# Patient Record
Sex: Female | Born: 1945 | Race: White | Hispanic: No | Marital: Married | Smoking: Never smoker
Health system: Southern US, Community
[De-identification: ages and names within clinical notes are randomized; demographics above are authoritative.]

## PROBLEM LIST (undated history)

## (undated) ENCOUNTER — Emergency Department (HOSPITAL_COMMUNITY): Disposition: A | Discharge status: Other Acute Inpatient Hospital | Payer: Medicare Other

## (undated) DIAGNOSIS — F329 Major depressive disorder, single episode, unspecified: Secondary | ICD-10-CM

## (undated) DIAGNOSIS — L97909 Non-pressure chronic ulcer of unspecified part of unspecified lower leg with unspecified severity: Secondary | ICD-10-CM

## (undated) DIAGNOSIS — C801 Malignant (primary) neoplasm, unspecified: Secondary | ICD-10-CM

## (undated) DIAGNOSIS — D649 Anemia, unspecified: Secondary | ICD-10-CM

## (undated) DIAGNOSIS — R569 Unspecified convulsions: Secondary | ICD-10-CM

## (undated) DIAGNOSIS — K5900 Constipation, unspecified: Secondary | ICD-10-CM

## (undated) DIAGNOSIS — I83009 Varicose veins of unspecified lower extremity with ulcer of unspecified site: Secondary | ICD-10-CM

## (undated) DIAGNOSIS — E1121 Type 2 diabetes mellitus with diabetic nephropathy: Secondary | ICD-10-CM

## (undated) DIAGNOSIS — I872 Venous insufficiency (chronic) (peripheral): Secondary | ICD-10-CM

## (undated) DIAGNOSIS — I4891 Unspecified atrial fibrillation: Secondary | ICD-10-CM

## (undated) DIAGNOSIS — G629 Polyneuropathy, unspecified: Secondary | ICD-10-CM

## (undated) DIAGNOSIS — M1990 Unspecified osteoarthritis, unspecified site: Secondary | ICD-10-CM

## (undated) DIAGNOSIS — H409 Unspecified glaucoma: Secondary | ICD-10-CM

## (undated) DIAGNOSIS — E0821 Diabetes mellitus due to underlying condition with diabetic nephropathy: Secondary | ICD-10-CM

## (undated) DIAGNOSIS — F32A Depression, unspecified: Secondary | ICD-10-CM

## (undated) DIAGNOSIS — C719 Malignant neoplasm of brain, unspecified: Secondary | ICD-10-CM

## (undated) HISTORY — DX: Venous insufficiency (chronic) (peripheral): I87.2

## (undated) HISTORY — DX: Diabetes mellitus due to underlying condition with diabetic nephropathy: E08.21

## (undated) HISTORY — DX: Varicose veins of unspecified lower extremity with ulcer of unspecified site: I83.009

## (undated) HISTORY — DX: Malignant neoplasm of brain, unspecified: C71.9

## (undated) HISTORY — PX: CERVICAL ABLATION: SHX5771

## (undated) HISTORY — DX: Type 2 diabetes mellitus with diabetic nephropathy: E11.21

## (undated) HISTORY — DX: Unspecified osteoarthritis, unspecified site: M19.90

## (undated) HISTORY — PX: TONSILECTOMY, ADENOIDECTOMY, BILATERAL MYRINGOTOMY AND TUBES: SHX2538

## (undated) HISTORY — PX: MOUTH SURGERY: SHX715

## (undated) HISTORY — DX: Anemia, unspecified: D64.9

## (undated) HISTORY — DX: Polyneuropathy, unspecified: G62.9

## (undated) HISTORY — DX: Non-pressure chronic ulcer of unspecified part of unspecified lower leg with unspecified severity: L97.909

---

## 2000-01-18 ENCOUNTER — Encounter: Admission: RE | Admit: 2000-01-18 | Discharge: 2000-04-09 | Payer: Self-pay | Admitting: Internal Medicine

## 2000-04-11 ENCOUNTER — Encounter: Admission: RE | Admit: 2000-04-11 | Discharge: 2000-06-12 | Payer: Self-pay | Admitting: Orthopedic Surgery

## 2000-06-13 ENCOUNTER — Encounter: Admission: RE | Admit: 2000-06-13 | Discharge: 2000-09-11 | Payer: Self-pay | Admitting: Internal Medicine

## 2000-08-04 ENCOUNTER — Emergency Department (HOSPITAL_COMMUNITY): Admission: EM | Admit: 2000-08-04 | Discharge: 2000-08-04 | Payer: Self-pay | Admitting: Emergency Medicine

## 2000-08-24 ENCOUNTER — Ambulatory Visit (HOSPITAL_COMMUNITY): Admission: RE | Admit: 2000-08-24 | Discharge: 2000-08-24 | Payer: Self-pay | Admitting: Family Medicine

## 2000-09-12 ENCOUNTER — Encounter: Admission: RE | Admit: 2000-09-12 | Discharge: 2000-12-11 | Payer: Self-pay | Admitting: Internal Medicine

## 2000-12-12 ENCOUNTER — Encounter: Admission: RE | Admit: 2000-12-12 | Discharge: 2001-03-12 | Payer: Self-pay | Admitting: Internal Medicine

## 2001-03-13 ENCOUNTER — Encounter: Admission: RE | Admit: 2001-03-13 | Discharge: 2001-05-20 | Payer: Self-pay | Admitting: Internal Medicine

## 2001-08-21 ENCOUNTER — Encounter: Admission: RE | Admit: 2001-08-21 | Discharge: 2001-09-09 | Payer: Self-pay | Admitting: Internal Medicine

## 2002-02-05 ENCOUNTER — Encounter (HOSPITAL_BASED_OUTPATIENT_CLINIC_OR_DEPARTMENT_OTHER): Admission: RE | Admit: 2002-02-05 | Discharge: 2002-02-07 | Payer: Self-pay | Admitting: Internal Medicine

## 2002-05-12 ENCOUNTER — Encounter (HOSPITAL_BASED_OUTPATIENT_CLINIC_OR_DEPARTMENT_OTHER): Admission: RE | Admit: 2002-05-12 | Discharge: 2002-05-15 | Payer: Self-pay | Admitting: Internal Medicine

## 2002-08-18 ENCOUNTER — Encounter (HOSPITAL_BASED_OUTPATIENT_CLINIC_OR_DEPARTMENT_OTHER): Admission: RE | Admit: 2002-08-18 | Discharge: 2002-11-16 | Payer: Self-pay | Admitting: Internal Medicine

## 2002-11-17 ENCOUNTER — Encounter (HOSPITAL_BASED_OUTPATIENT_CLINIC_OR_DEPARTMENT_OTHER): Admission: RE | Admit: 2002-11-17 | Discharge: 2002-12-10 | Payer: Self-pay | Admitting: Internal Medicine

## 2002-12-11 ENCOUNTER — Encounter (HOSPITAL_BASED_OUTPATIENT_CLINIC_OR_DEPARTMENT_OTHER): Admission: RE | Admit: 2002-12-11 | Discharge: 2003-03-11 | Payer: Self-pay | Admitting: Internal Medicine

## 2003-03-12 ENCOUNTER — Encounter (HOSPITAL_BASED_OUTPATIENT_CLINIC_OR_DEPARTMENT_OTHER): Admission: RE | Admit: 2003-03-12 | Discharge: 2003-06-10 | Payer: Self-pay | Admitting: Internal Medicine

## 2003-06-15 ENCOUNTER — Encounter (HOSPITAL_BASED_OUTPATIENT_CLINIC_OR_DEPARTMENT_OTHER): Admission: RE | Admit: 2003-06-15 | Discharge: 2003-07-07 | Payer: Self-pay | Admitting: Internal Medicine

## 2003-07-15 ENCOUNTER — Encounter (HOSPITAL_BASED_OUTPATIENT_CLINIC_OR_DEPARTMENT_OTHER): Admission: RE | Admit: 2003-07-15 | Discharge: 2003-08-12 | Payer: Self-pay | Admitting: Internal Medicine

## 2003-08-25 ENCOUNTER — Encounter (HOSPITAL_BASED_OUTPATIENT_CLINIC_OR_DEPARTMENT_OTHER): Admission: RE | Admit: 2003-08-25 | Discharge: 2003-11-16 | Payer: Self-pay | Admitting: Internal Medicine

## 2003-11-17 ENCOUNTER — Encounter (HOSPITAL_BASED_OUTPATIENT_CLINIC_OR_DEPARTMENT_OTHER): Admission: RE | Admit: 2003-11-17 | Discharge: 2004-02-11 | Payer: Self-pay | Admitting: Internal Medicine

## 2004-02-12 ENCOUNTER — Encounter (HOSPITAL_BASED_OUTPATIENT_CLINIC_OR_DEPARTMENT_OTHER): Admission: RE | Admit: 2004-02-12 | Discharge: 2004-05-12 | Payer: Self-pay | Admitting: Internal Medicine

## 2004-06-01 ENCOUNTER — Encounter (HOSPITAL_BASED_OUTPATIENT_CLINIC_OR_DEPARTMENT_OTHER): Admission: RE | Admit: 2004-06-01 | Discharge: 2004-08-24 | Payer: Self-pay | Admitting: Internal Medicine

## 2004-08-25 ENCOUNTER — Encounter (HOSPITAL_BASED_OUTPATIENT_CLINIC_OR_DEPARTMENT_OTHER): Admission: RE | Admit: 2004-08-25 | Discharge: 2004-11-25 | Payer: Self-pay | Admitting: Internal Medicine

## 2004-11-23 ENCOUNTER — Encounter (HOSPITAL_BASED_OUTPATIENT_CLINIC_OR_DEPARTMENT_OTHER): Admission: RE | Admit: 2004-11-23 | Discharge: 2004-12-02 | Payer: Self-pay | Admitting: Internal Medicine

## 2004-12-15 ENCOUNTER — Encounter (HOSPITAL_BASED_OUTPATIENT_CLINIC_OR_DEPARTMENT_OTHER): Admission: RE | Admit: 2004-12-15 | Discharge: 2005-03-15 | Payer: Self-pay | Admitting: Surgery

## 2005-03-17 ENCOUNTER — Encounter (HOSPITAL_BASED_OUTPATIENT_CLINIC_OR_DEPARTMENT_OTHER): Admission: RE | Admit: 2005-03-17 | Discharge: 2005-05-09 | Payer: Self-pay | Admitting: Surgery

## 2005-05-30 ENCOUNTER — Encounter (HOSPITAL_BASED_OUTPATIENT_CLINIC_OR_DEPARTMENT_OTHER): Admission: RE | Admit: 2005-05-30 | Discharge: 2005-06-07 | Payer: Self-pay | Admitting: Surgery

## 2005-09-28 ENCOUNTER — Encounter (HOSPITAL_BASED_OUTPATIENT_CLINIC_OR_DEPARTMENT_OTHER): Admission: RE | Admit: 2005-09-28 | Discharge: 2005-12-27 | Payer: Self-pay | Admitting: Surgery

## 2006-01-05 ENCOUNTER — Encounter (HOSPITAL_BASED_OUTPATIENT_CLINIC_OR_DEPARTMENT_OTHER): Admission: RE | Admit: 2006-01-05 | Discharge: 2006-04-05 | Payer: Self-pay | Admitting: Internal Medicine

## 2006-03-26 ENCOUNTER — Emergency Department (HOSPITAL_COMMUNITY): Admission: EM | Admit: 2006-03-26 | Discharge: 2006-03-27 | Payer: Self-pay | Admitting: Emergency Medicine

## 2006-04-11 ENCOUNTER — Encounter (HOSPITAL_BASED_OUTPATIENT_CLINIC_OR_DEPARTMENT_OTHER): Admission: RE | Admit: 2006-04-11 | Discharge: 2006-05-16 | Payer: Self-pay | Admitting: Surgery

## 2006-05-28 ENCOUNTER — Encounter (HOSPITAL_BASED_OUTPATIENT_CLINIC_OR_DEPARTMENT_OTHER): Admission: RE | Admit: 2006-05-28 | Discharge: 2006-07-25 | Payer: Self-pay | Admitting: Surgery

## 2007-05-01 ENCOUNTER — Encounter (HOSPITAL_BASED_OUTPATIENT_CLINIC_OR_DEPARTMENT_OTHER): Admission: RE | Admit: 2007-05-01 | Discharge: 2007-06-11 | Payer: Self-pay | Admitting: Surgery

## 2007-06-15 ENCOUNTER — Encounter (HOSPITAL_BASED_OUTPATIENT_CLINIC_OR_DEPARTMENT_OTHER): Admission: RE | Admit: 2007-06-15 | Discharge: 2007-09-13 | Payer: Self-pay | Admitting: Surgery

## 2007-09-17 ENCOUNTER — Encounter (HOSPITAL_BASED_OUTPATIENT_CLINIC_OR_DEPARTMENT_OTHER): Admission: RE | Admit: 2007-09-17 | Discharge: 2007-12-16 | Payer: Self-pay | Admitting: Internal Medicine

## 2007-12-24 ENCOUNTER — Encounter (HOSPITAL_BASED_OUTPATIENT_CLINIC_OR_DEPARTMENT_OTHER): Admission: RE | Admit: 2007-12-24 | Discharge: 2008-03-23 | Payer: Self-pay | Admitting: Surgery

## 2008-04-24 ENCOUNTER — Encounter (HOSPITAL_BASED_OUTPATIENT_CLINIC_OR_DEPARTMENT_OTHER): Admission: RE | Admit: 2008-04-24 | Discharge: 2008-06-09 | Payer: Self-pay | Admitting: Internal Medicine

## 2008-06-11 ENCOUNTER — Encounter (HOSPITAL_BASED_OUTPATIENT_CLINIC_OR_DEPARTMENT_OTHER): Admission: RE | Admit: 2008-06-11 | Discharge: 2008-09-09 | Payer: Self-pay | Admitting: Internal Medicine

## 2008-09-15 ENCOUNTER — Encounter (HOSPITAL_BASED_OUTPATIENT_CLINIC_OR_DEPARTMENT_OTHER): Admission: RE | Admit: 2008-09-15 | Discharge: 2008-11-20 | Payer: Self-pay | Admitting: Internal Medicine

## 2008-10-29 ENCOUNTER — Other Ambulatory Visit: Admission: RE | Admit: 2008-10-29 | Discharge: 2008-10-29 | Payer: Self-pay | Admitting: Family Medicine

## 2009-05-06 ENCOUNTER — Encounter (HOSPITAL_BASED_OUTPATIENT_CLINIC_OR_DEPARTMENT_OTHER): Admission: RE | Admit: 2009-05-06 | Discharge: 2009-08-04 | Payer: Self-pay | Admitting: General Surgery

## 2009-08-11 ENCOUNTER — Encounter (HOSPITAL_BASED_OUTPATIENT_CLINIC_OR_DEPARTMENT_OTHER): Admission: RE | Admit: 2009-08-11 | Discharge: 2009-08-23 | Payer: Self-pay | Admitting: Internal Medicine

## 2010-07-18 ENCOUNTER — Encounter (HOSPITAL_BASED_OUTPATIENT_CLINIC_OR_DEPARTMENT_OTHER)
Admission: RE | Admit: 2010-07-18 | Discharge: 2010-10-11 | Payer: Self-pay | Source: Home / Self Care | Attending: Internal Medicine | Admitting: Internal Medicine

## 2010-10-17 ENCOUNTER — Encounter (HOSPITAL_BASED_OUTPATIENT_CLINIC_OR_DEPARTMENT_OTHER): Payer: Medicare Other | Attending: Internal Medicine

## 2010-10-17 DIAGNOSIS — E1169 Type 2 diabetes mellitus with other specified complication: Secondary | ICD-10-CM | POA: Insufficient documentation

## 2010-10-17 DIAGNOSIS — I872 Venous insufficiency (chronic) (peripheral): Secondary | ICD-10-CM | POA: Insufficient documentation

## 2010-10-17 DIAGNOSIS — L97809 Non-pressure chronic ulcer of other part of unspecified lower leg with unspecified severity: Secondary | ICD-10-CM | POA: Insufficient documentation

## 2010-10-17 DIAGNOSIS — I8390 Asymptomatic varicose veins of unspecified lower extremity: Secondary | ICD-10-CM | POA: Insufficient documentation

## 2010-11-14 ENCOUNTER — Encounter (HOSPITAL_BASED_OUTPATIENT_CLINIC_OR_DEPARTMENT_OTHER): Payer: Medicare Other | Attending: Internal Medicine

## 2010-11-14 DIAGNOSIS — I8390 Asymptomatic varicose veins of unspecified lower extremity: Secondary | ICD-10-CM | POA: Insufficient documentation

## 2010-11-14 DIAGNOSIS — E1169 Type 2 diabetes mellitus with other specified complication: Secondary | ICD-10-CM | POA: Insufficient documentation

## 2010-11-14 DIAGNOSIS — L97809 Non-pressure chronic ulcer of other part of unspecified lower leg with unspecified severity: Secondary | ICD-10-CM | POA: Insufficient documentation

## 2010-11-14 DIAGNOSIS — I872 Venous insufficiency (chronic) (peripheral): Secondary | ICD-10-CM | POA: Insufficient documentation

## 2010-12-12 ENCOUNTER — Encounter (HOSPITAL_BASED_OUTPATIENT_CLINIC_OR_DEPARTMENT_OTHER): Payer: Medicare Other

## 2010-12-19 ENCOUNTER — Encounter (HOSPITAL_BASED_OUTPATIENT_CLINIC_OR_DEPARTMENT_OTHER): Payer: Medicare Other | Attending: Internal Medicine

## 2010-12-19 DIAGNOSIS — Z79899 Other long term (current) drug therapy: Secondary | ICD-10-CM | POA: Insufficient documentation

## 2010-12-19 DIAGNOSIS — A4901 Methicillin susceptible Staphylococcus aureus infection, unspecified site: Secondary | ICD-10-CM | POA: Insufficient documentation

## 2010-12-19 DIAGNOSIS — L97809 Non-pressure chronic ulcer of other part of unspecified lower leg with unspecified severity: Secondary | ICD-10-CM | POA: Insufficient documentation

## 2010-12-19 DIAGNOSIS — E1169 Type 2 diabetes mellitus with other specified complication: Secondary | ICD-10-CM | POA: Insufficient documentation

## 2010-12-19 DIAGNOSIS — Z794 Long term (current) use of insulin: Secondary | ICD-10-CM | POA: Insufficient documentation

## 2011-01-13 ENCOUNTER — Encounter (HOSPITAL_BASED_OUTPATIENT_CLINIC_OR_DEPARTMENT_OTHER): Payer: Medicare Other | Attending: Internal Medicine

## 2011-01-13 DIAGNOSIS — E1169 Type 2 diabetes mellitus with other specified complication: Secondary | ICD-10-CM | POA: Insufficient documentation

## 2011-01-13 DIAGNOSIS — Z79899 Other long term (current) drug therapy: Secondary | ICD-10-CM | POA: Insufficient documentation

## 2011-01-13 DIAGNOSIS — A4901 Methicillin susceptible Staphylococcus aureus infection, unspecified site: Secondary | ICD-10-CM | POA: Insufficient documentation

## 2011-01-13 DIAGNOSIS — L97809 Non-pressure chronic ulcer of other part of unspecified lower leg with unspecified severity: Secondary | ICD-10-CM | POA: Insufficient documentation

## 2011-01-13 DIAGNOSIS — Z794 Long term (current) use of insulin: Secondary | ICD-10-CM | POA: Insufficient documentation

## 2011-01-24 NOTE — Assessment & Plan Note (Signed)
[facility]   [name], [name]               ACCOUNT NO.:  [id number]   MEDICAL RECORD NO.:  [id number]      DATE OF BIRTH:  1946-09-04   PHYSICIAN:  Jonelle Sports. Sevier, M.D.  VISIT DATE:  11/27/2007                                   OFFICE VISIT   HISTORY:  This 65 year old white female has been followed for a  refractory venostasis ulcer which occurred in the area of old scarring  on the lateral left ankle.  After months in palliative care with this it  had reached a point where we elected to try an Apligraf.  This was  dramatically successful and indeed within 2 weeks after application of  the Apligraf the wound was completely heal.  She was continued, however,  in a protective wrap at that time and unfortunately over the next week  it broke down again.  She is seen now for the second week since  recurrence of that ulcer began having been in a pro for wrap.   PHYSICAL EXAMINATION:  VITALS:  Blood pressure 147/69, pulse 69,  respirations 18, temperature 98.2.  EXTREMITIES:  The wound itself now measures 2.0 x 1.7 x 0.05 cm which is  slightly increased from its previous measurements.  It has a fairly  clean base.  No hyper granularity.  There is some evidence of draining  with encrustation at the margins of the wound.   IMPRESSION:  Recurrent breakdown stasis ulceration left lower extremity.   DISPOSITION:  The crust around the wound are selectively debrided  sharply without any incident.   Decision was made to apply Oasis to this lesion and that is again done  in the standard manner and it is held in place with two Steri-Strips.  It is covered with a dressing that should keep it moist and the  extremities returned to a Profore wrap with the remainder of that  extremity having been pretreated with an application of Ultralente 25.   Follow-up visit will be here in 1 week.           ______________________________  Jonelle Sports. Cheryll Cockayne, M.D.     RES/MEDQ  D:  11/27/2007  T:  11/28/2007  Job:  [id number]

## 2011-01-24 NOTE — Assessment & Plan Note (Signed)
[facility]   [name], [name]               ACCOUNT NO.:  [id number]   MEDICAL RECORD NO.:  [id number]      DATE OF BIRTH:  10-26-1945   PHYSICIAN:  [name] Majors. [name], M.D. VISIT DATE:  12/16/2007                                   OFFICE VISIT   SUBJECTIVE:  [name] is a 65 year old lady who we are following for  a recalcitrant stasis ulcer involving the left lateral leg.  In the  interim, she has been treated with an Oasis protocol with the retention  of the compression wrap.  There has been no excessive drainage or  malodor.  There has been no fever.  She continues to be ambulatory.   OBJECTIVE:  VITAL SIGNS:  Blood pressure 128/97, respirations 16, pulse  rate 68, temperature is 97.8.  She is accompanied by her husband.  Capillary blood glucose is 134 mg percent.  Inspection of the left  lateral ankle shows that the ulcer bed is clean.  There is no  indentation.  There is healthy-appearing granulation.  The previously  placed Oasis has been incorporated into the wound.  There is minimum  epithelial advancement.  The edema is well-controlled and judged at 1+.  There remains prominent varicosities around the lateral foot, but there  are no ropy varicosities in the lesser greater saphenous distribution.  Capillary refill is brisk.  There is no evidence of lymphangitis.   ASSESSMENT:  Marginal improvement of stasis ulcer.   PLAN:  We will continue the Oasis protocol and reevaluate the patient in  1 week.      [name], M.D.  Electronically Signed     HAN/MEDQ  D:  12/16/2007  T:  12/16/2007  Job:  [id number]

## 2011-01-24 NOTE — Assessment & Plan Note (Signed)
[facility]   NAMEISABELLY, KOBLER               ACCOUNT NO.:  [id number]   MEDICAL RECORD NO.:  [id number]      DATE OF BIRTH:  08/20/1946   PHYSICIAN:  Maxwell Caul, M.D.      VISIT DATE:                                   OFFICE VISIT   We saw Mrs. Mccoin today in followup for venostasis of her left lateral  leg.  She had been treated most recently with Silverlon and a Profore to  the left leg.  As predicted the wound has healed nicely and contracted.  I am therefore declaring all of her wounds resolved.   On examination, her temperature is 98.4, pulse 76, respirations 20,  blood pressure 137/70.  The wound on the left anterior lower extremity  is fully epithelialized and resolved.  She continues to have adequate  edema control.   IMPRESSION:  Venostasis ulceration, resolved.  I have re-wrapped her  with a Profore.  Her husband has been doing her wraps on her other leg  using some combination of Profore dressings.  Ultimately, I think an  external compression pump may be necessary here as she apparently has  not been able to be tolerate graded pressure stockings in the past.  However, I think that we should only consider this if there is future  wounds especially in the short term.           ______________________________  Maxwell Caul, M.D.     MGR/MEDQ  D:  03/19/2008  T:  03/20/2008  Job:  [id number]

## 2011-01-24 NOTE — Assessment & Plan Note (Signed)
[facility]   NAMEJOYCELINE, [name]               ACCOUNT NO.:  [id number]   MEDICAL RECORD NO.:  [id number]      DATE OF BIRTH:  12-03-1945   PHYSICIAN:  Jonelle Sports. Sevier, M.D.  VISIT DATE:  08/26/2008                                   OFFICE VISIT   HISTORY:  This 65 year old white female has been followed for a  recurrent stasis ulceration on the lateral malleolar area of the left  foot.  We had treated this approximately 2 months ago with Apligraf  application and it has waxed and waned a bit since that time, but  happily for the past couple of visits, it has been progressively  improving and moving toward healing.   She arrives today with no particular complaints and no indication that  anything would be expected to be anything less well than before.   PHYSICAL EXAMINATION:  Blood pressure 140/77, pulse 71, respirations 20,  temperature 97.6.  Random blood glucose 145 mg/dL.   The area on the left lateral malleolar aspect of the left ankle is now  largely healed with only 0.3 x 0.1 cm area that has not yet  epithelialized.   IMPRESSION:  Dramatic improvement of stasis ulceration in the left  lateral malleolar area.   DISPOSITION:  The wound requires no debridement today, will be dressed  with an application of Puracol and the leg will be treated widely with  barrier cream for the patient's each protection.  The extremity was then  placed in a Profore wrap.   Followup visit will be for dressing changes by the nurse in 1-2 weeks  and to see the physician again in 3 weeks.           ______________________________  Jonelle Sports Cheryll Cockayne, M.D.     RES/MEDQ  D:  08/26/2008  T:  08/27/2008  Job:  [id number]

## 2011-01-24 NOTE — Assessment & Plan Note (Signed)
[facility]   NAMENADIRA, SINGLE               ACCOUNT NO.:  [id number]   MEDICAL RECORD NO.:  [id number]      DATE OF BIRTH:  02/13/1946   PHYSICIAN:  Theresia Majors. Tanda Rockers, M.D. VISIT DATE:  05/07/2007                                   OFFICE VISIT   SUBJECTIVE:  Ms. Eichholz returns for follow-up of a left stasis  ulceration.  The patient was seen 5 days ago but returns due to increase  in drainage.  She has had some mild to moderate pain on the anterior  shin but there has been no fever.  The patient did unwrap and rewrap her  Profore.   OBJECTIVE:  Blood pressure is 148/75, respirations are 20, pulse rate  66, temperature 97.8.  Capillary blood glucose is 134 mg percent.  Inspection of the left lower extremity shows that the ulcer remains  clean.  There is no excessive exudate.  There is no extreme malodor on  the anterior shin.  There is hyperemia with recent excoriation and  blister formation.  There is periblister hyperemia.  The blisters  themselves have ruptured, no debridement is necessary, capillary refill  is brisk.  There is associated lymphedema extending into the toes, we  were easily able to milk the foot down without significant pain.  The  pedal pulse remains indeterminate but the capillary refill is brisk.  There is mild tenderness in the popliteal fossa, no tenderness in the  groin or the medial thigh.   ASSESSMENT:  Persistent stasis ulcer.   PLAN:  We are reapplying the Silverlon to the areas of the injury with  Ultra-Mide, we will apply SofSorbs over both areas of wound to absorb  the excessive drainage.  The patient is continue on her medical  management including her diuretic.  She is to keep the leg elevated.  In  the event that she experiences progressive increase in drainage, pain or  fever she is to call the clinic for reevaluation or primary care or the  emergency room.  We have discussed the inadvisability of rewrapping the  compression dressing.  The patient seems to understand and indicates  that she will be compliant.  We will reevaluate her in 1 week.      Harold A. Tanda Rockers, M.D.  Electronically Signed     HAN/MEDQ  D:  05/07/2007  T:  05/07/2007  Job:  [id number]

## 2011-01-24 NOTE — Assessment & Plan Note (Signed)
[facility]   [name], [name]               ACCOUNT NO.:  [id number]   MEDICAL RECORD NO.:  [id number]      DATE OF BIRTH:  Nov 08, 1945   PHYSICIAN:  Jonelle Sports. Sevier, M.D.  VISIT DATE:  11/06/2007                                   OFFICE VISIT   This 65 year old white female is been followed for a chronic stasis  ulceration and the left lateral malleolar area which is also an area of  former trauma.  This has been a recurring problem and the present ulcer  has been open since about August 2007.   Four weeks ago we have placed a Apligraf on wound, having gotten it  properly prepared and she is seen now in follow-up.  In the preceding  week, she has been treated with Silverlon patch and Profore wrap.   She reports no difficulties during the week, no systemic symptoms, and  no new or disturbing local symptoms.   EXAMINATION:  Blood pressure 144/68, pulse 59, respirations 18,  temperature 97.0.  The wound on the left lateral lower leg now measures 1.8 x 1.5 x 0.1 cm  which is considerably smaller than before and seems to be partially  epithelialized.  There has certainly been definite progress since the  application of Apligraf.   IMPRESSION:  Stasis ulcer left lateral malleolar area improving  following Apligraf application.   DISPOSITION:  1..  I have selectively debrided using a scalpel with  caution a good deal of crust in dried blood from this area and indeed  the wound looks excellent, looks the best it has to my eye in a number  of months.  1. We will treated today with placing a Silverlon swath over the wound      and returning that extremity to a Profore wrap.  At her request,      Ultramide cream will be used on the extremity underlying the wrap.  2. Follow-up visit will be here in 1 week.           ______________________________  Jonelle Sports. Cheryll Cockayne, M.D.     RES/MEDQ  D:  11/06/2007  T:  11/06/2007  Job:  [id number]

## 2011-01-24 NOTE — Assessment & Plan Note (Signed)
[facility]   NAMEDOYLE, TEGETHOFF               ACCOUNT NO.:  [id number]   MEDICAL RECORD NO.:  [id number]      DATE OF BIRTH:  1946-08-22   PHYSICIAN:  Theresia Majors. Tanda Rockers, M.D. VISIT DATE:  10/30/2007                                   OFFICE VISIT   SUBJECTIVE:  Ms. Busker returns for follow up of a stasis ulcer  involving the lateral aspect of the left lower extremity.  In the  interim, she has worn a Therapist, art and triamcinolone cream with a  Profore.  She continues to be ambulatory.   OBJECTIVE:  VITAL SIGNS:  Blood pressure is 148/73, respirations 18,  pulse rate 69, temperature 97.5.  Capillary blood glucose is 147 mg%.  EXTREMITIES:  Inspection of the left lower extremity shows that there is  a halo of advancing epithelium.  There is a healthy-appearing wound with  minimum desquamation.  Edema is 1 to 2+.  Capillary refill is brisk.  The right lower extremity is free of ulceration.  There are multiple  areas of reticular veins which are prominent.  There is no acute  inflammation and no ischemia.   ASSESSMENT:  Clinical improvement of stasis ulcer on the left lower  extremity with well-controlled edema of the right lower extremity.   PLAN:  We will resume the use of the Silverlon swath, Profore wrap with  Ultramide cream.  We will reevaluate the patient in 1 week.  We will  proceed with using sequential leg pumps as soon as those are available.      Harold A. Tanda Rockers, M.D.  Electronically Signed     HAN/MEDQ  D:  10/30/2007  T:  10/31/2007  Job:  [id number]

## 2011-01-24 NOTE — Assessment & Plan Note (Signed)
[facility]   NAMEKITTI, MCCLISH               ACCOUNT NO.:  [id number]   MEDICAL RECORD NO.:  [id number]      DATE OF BIRTH:  [birth year]   PHYSICIAN:  Lenon Curt. Chilton Si, M.D.   VISIT DATE:  07/30/2008                                   OFFICE VISIT   HISTORY:  A 65 year old female with chronic venous stasis changes in  legs and ulcerations in the left leg returns today for wound recheck.  She has some itching under the Profore bandages that have been used.  She feels that she is doing fairly well.  There is no significant pain.  Minimal drainage was noted on the bandages today.   PHYSICAL EXAMINATION:  VITAL SIGNS:  Temperature 97.9, pulse 63,  respirations 14, and blood pressure 125/79.  Capillary glucose 123 mg%.  EXTREMITIES:  Wound measurements of the left lateral ankle are 0.9 x 0.8  x 0.1 cm.  Left lateral ankle proximally as a second wound of 0.9 x 1.1  x 0.1 cm.  Both wounds appear to be healing in.  Measurements were  approximately the same as those noted on the visitation date of July 22, 2008.   TREATMENT:  Both wounds have had Puracol, SofSorb, and Profore dressing  applied.  We recommended that she return in 1 week for dressing change  and 2 weeks for a physician visit and hopefully discharge visit at that  time.   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   CPT code 54098.      Lenon Curt Chilton Si, M.D.  Electronically Signed     AGG/MEDQ  D:  07/30/2008  T:  07/31/2008  Job:  [id number]

## 2011-01-24 NOTE — Assessment & Plan Note (Signed)
[facility]   [name], [name]               ACCOUNT NO.:  [id number]   MEDICAL RECORD NO.:  [id number]      DATE OF BIRTH:  1946/03/13   PHYSICIAN:  Jonelle Sports. Sevier, M.D.  VISIT DATE:  11/20/2007                                   OFFICE VISIT   HISTORY:  This 65 year old white female has been followed for an  extended period of time with a refractory left lateral malleolar area  ulcer in an area of extensive previous traumatic and surgical scarring.  Some 5 weeks ago we had placed an Apligraf on this area and in fact got  essentially 100 percent take.  When the patient was seen last week the  wound still looked fragile but appeared to be totally epithelialized.  She was returned at that time to topical dressing and Profore wrap and  at her request a pad was placed posteriorly in the Achilles tendon area  on that side because she said it tolerated wraps poorly and they tend to  tighten themselves up in that area.   She arrives today saying that she removed her Profore 2 days ago (which  has been a pattern with her in the past, namely always to find some  reason to remove dressings before appointment time) because the pad had  slipped and was uncomfortable.   PHYSICAL EXAMINATION:  VITALS:  Blood pressure 126/67, pulse 69,  respirations 20, temperature 98.7.  SKIN:  The wound now unfortunately again has an open area measuring 0.7  x 0.8 x 0.1 cm covered with a small bit of slough and some loose  superficial layer of skin.  There is indeed some redness, both proximal  to the wound anteriorly and also on the posterior aspect the calf where  the previous Aleve and patting had been placed that would suggest some  product sensitivity.   IMPRESSION:  1. Very slight breakdown at site of previous stasis ulceration left      ankle.  2. Product sensitivity with topical skin reaction.   DISPOSITION:  The wound is very cautiously selectively debrided of the  loose partial-thickness skin and also some small amount of slough on the  wound surface then looks reasonably good.   The wound itself will be treated with an application of a Silverlon pad  which will be held in place with Steri-Strips and the reddened areas  where product irritation to suggest it will be treated with an  application of .10% triamcinolone cream.  The remainder of that  extremity and foot will be treated with an application of Ultralente 25.   A felt as opposed to foam pad will be used posteriorly at the Achilles  tendon area and this will be anchored with micropore tape to reasonably  healthy areas of skin not near the wound itself.   Extremity is then placed in a Profore wrap.   Follow-up visit with this patient will be in 1 week.           ______________________________  Jonelle Sports. Cheryll Cockayne, M.D.     RES/MEDQ  D:  11/20/2007  T:  11/21/2007  Job:  [id number]

## 2011-01-24 NOTE — Assessment & Plan Note (Signed)
[facility]   NAMESAMYIAH, HALVORSEN               ACCOUNT NO.:  [id number]   MEDICAL RECORD NO.:  [id number]      DATE OF BIRTH:  09-16-1945   PHYSICIAN:  Maxwell Caul, M.D. VISIT DATE:  06/11/2008                                   OFFICE VISIT   Mrs. Basden has chronic ulcerations on her left lateral lower leg.  She  returned today for application of an Apligraf.  The area is a bridged  area on the left lateral ankle.  The wound did not need to be debrided.  There is no evidence of infection.  We applied the Apligraf fixed with  Steri-Strips covered by a Mepitel and rewrapped her in a Profore.  We  will look at this again next week.           ______________________________  Maxwell Caul, M.D.     MGR/MEDQ  D:  06/11/2008  T:  06/12/2008  Job:  [id number]

## 2011-01-24 NOTE — Assessment & Plan Note (Signed)
[facility]   [name], [name]               ACCOUNT NO.:  [id number]   MEDICAL RECORD NO.:  [id number]      DATE OF BIRTH:  01-06-1946   PHYSICIAN:  Theresia Majors. Tanda Rockers, M.D. VISIT DATE:  02/20/2008                                   OFFICE VISIT   SUBJECTIVE:  [name] is a 65 year old lady who we followed for  stasis ulcer involving her lateral left leg.  In the interim, she has  been treated with the Silverlon swathe, Ultra-Mide , and Profore.  She  has been seen weekly by the nurse.  There has been reasonable control of  her edema.  There has been no excessive drainage, malodor, pain, or  fever.   OBJECTIVE:  Blood pressure is 139/66, respirations 16, pulse rate 65,  temperature is 98, capillary blood glucose 138 mg percent.  Inspection  of the left lower extremity shows that the edema is well controlled, is  manifested by linear wrinkles.  The previous wound is near 100% re-  epithelialized.  There is no hyperemia, cellulitis, or pain.  There  continues to be bulbous edema consistent with lymphedema in the toes.  The sensation is still moderately impaired.  There is no evidence of  ascended infection or ischemia.   ASSESSMENT:  Clinical improvement with compression therapy.   PLAN:  We will return the patient to the Silverlon, Ultra-Mide and  Profore wrap.  She will be evaluated weekly by the nurse and in 2 weeks  by the physician.      Harold A. Tanda Rockers, M.D.  Electronically Signed     HAN/MEDQ  D:  02/20/2008  T:  02/21/2008  Job:  [id number]

## 2011-01-24 NOTE — Assessment & Plan Note (Signed)
[facility]   NAMELAKEYN, DOKKEN               ACCOUNT NO.:  [id number]   MEDICAL RECORD NO.:  [id number]      DATE OF BIRTH:  05/16/1946   PHYSICIAN:  Jonelle Sports. Sevier, M.D.  VISIT DATE:  10/28/2008                                   OFFICE VISIT   HISTORY:  This 65 year old, obese, white female with type 2 diabetes is  followed for stasis ulcerations on her lower extremities to most  recently having had an active lesion on her left lateral malleolar area,  which has taken months to heal to include 2 Apligraf applications.  At  her last visit, it was 99% healed, and it was hoped it would be healed  today.   In the meanwhile, she has noted a small area beginning to break down  medially in the gaiter area of the right lower extremity and calls it  to our attention.   PHYSICAL EXAMINATION:  VITAL SIGNS:  Blood pressure 139/79, pulse 61,  respirations 18, temperature 98.4.  EXTREMITIES:  The wound on the left lower extremity now measures 0.3 x  0.3 x 0.1 cm and indeed is minuscule.   The new wound on the right lower extremity measures 1.1 x 1.0 x 0.1 cm  and has a clean and granular base.   IMPRESSION:  Venous stasis ulcerations, bilateral lower extremities.   DISPOSITION:  1. Both wounds will be treated with an application of Silverlon and      both extremities placed in Profore wraps.  2. Followup visit will be here in 1 week.           ______________________________  Jonelle Sports. Cheryll Cockayne, M.D.     RES/MEDQ  D:  10/28/2008  T:  10/29/2008  Job:  [id number]

## 2011-01-24 NOTE — Assessment & Plan Note (Signed)
[facility]   [name], [name]               ACCOUNT NO.:  [id number]   MEDICAL RECORD NO.:  [id number]      DATE OF BIRTH:  Jun 10, 1946   PHYSICIAN:  Theresia Majors. Tanda Rockers, M.D. VISIT DATE:  05/30/2007                                   OFFICE VISIT   SUBJECTIVE:  [name] is a 65 year old lady with a stasis ulcer  involving the left lateral leg.  During the interim she has complained  of excessive drainage and mild to moderate tenderness.  There has been  no fever.  She continues to be ambulatory.   OBJECTIVE:  Blood pressure is 140/70, respirations are 16, pulse rate  66, temperature is 97.7.  Capillary blood glucoses 236 mg percent.  She is accompanied by her husband.  Inspection of the left lower extremity shows that there is excoriation  and bruising in the anterior lateral aspect of the left lower extremity.  There is no apparent wrap injury.  The compression has been successful  as manifested by linear wrinkles of the leg.  There continues to be  puffiness in the toes and the dorsum of the foot.  Capillary refill is  brisk.  There is no local warmth.  The wound shows hypergranulation but  no malodor.   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   ASSESSMENT:  Stasis ulceration with colonization with Pseudomonas.   PLAN:  We are discontinuing the doxycycline and Septra.  We are placing  the patient on Cipro 250 mg p.o. b.i.d.  In addition to her dressing, we  will included Aquacel silver to provide more absorptive capacity as well  as a silver for antimicrobial activity.  We we have explained this  approach to the patient in terms that she seems to understand.  We have  given both she and her husband an opportunity to ask questions.  If  there is no significant improvement over the next 3-4 days, we will see  the patient on September 23.  Otherwise we will reevaluate her in 1  week.      Harold A. Tanda Rockers, M.D.  Electronically Signed     HAN/MEDQ  D:  05/30/2007  T:  05/30/2007  Job:  [id number]

## 2011-01-24 NOTE — Assessment & Plan Note (Signed)
[facility]   [name], [name]               ACCOUNT NO.:  [id number]   MEDICAL RECORD NO.:  [id number]      DATE OF BIRTH:  1946-03-25   PHYSICIAN:  Jonelle Sports. Sevier, M.D.  VISIT DATE:  09/18/2007                                   OFFICE VISIT   HISTORY:  This 65 year old white female has been known to this clinic  for a number of years.  She has chronic scarring on the left lateral  ankle area and has been prone to recurrent stasis ulcerations there.  The current ulcer has been active for 3-4 months.  She had been treated  with silver applications topically and compressive wrap (Profore), and  has shown limited improvement.   She returns today reporting that she feels the ulcers are no better (she  removes her own dressings the day before her visits here in order to  clean the area and so forth, and thus makes her own decisions about  progress).  She has not noticed any significant increased drainage, but  notes that it does tend to soak the dressings and compressive wrap.  She  has had no fever, chills or systemic symptoms.  She has noticed no  increased odor.   PHYSICAL EXAMINATION:  VITAL SIGNS:  Blood pressure is 143/79, pulse 71,  respirations 18, temperature 97.6.  EXTREMITIES:  The wound itself now has a small satellite and the 2 in  combination measure 3.2 x 2.2 x 0.1 cm.  The small satellited lesion has  a slight fibrinous exudate in its base.  The other wound is fairly  clean, but shows no evidence of epithelial advancement.   IMPRESSION:  Stalling of stasis wound left lower extremity.   DISPOSITION:  The primary wound appears to be ready for Apligraf  application which would appear to be indicated at this point since it is  stalled despite continuation of standard therapies.  The satellited  wound is today selectively debrided of the slough.  Both wounds are then  covered with a Puracol Plus silver dressing pad.  In turn, this is  covered  with a soft absorptive pad and that extremity is again placed in  a Profore wrap.   As indicated, application has been made for Apligraf to apply to the  wound.   FOLLOW UP:  Follow up visit will be here in 1 week.           ______________________________  Jonelle Sports. Cheryll Cockayne, M.D.     RES/MEDQ  D:  09/18/2007  T:  09/18/2007  Job:  [id number]

## 2011-01-24 NOTE — Assessment & Plan Note (Signed)
[facility]   NAMEBRAYLON, LEMMONS               ACCOUNT NO.:  [id number]   MEDICAL RECORD NO.:  [id number]      DATE OF BIRTH:  [birth year]   PHYSICIAN:  Theresia Majors. Tanda Rockers, M.D. VISIT DATE:  12/25/2007                                   OFFICE VISIT   SUBJECTIVE:  Ms. Maricle is a 65 year old lady who we have been  following for stasis ulceration involving her lateral right lower  extremity.  We have treated her left lateral lower extremity.  We have  treated her with an Oasis protocol.  Over the Easter Holiday, she had a  moderate increase in her activity which led to edema.  There had been no  fever, but there was noticeably more drainage.   OBJECTIVE:  Blood pressure is 131/82, respirations 16, pulse rate 69,  and temperature 98.1.  Capillary blood glucose is 135 mg%.  Inspection  of the left lateral leg shows that the ulcer has in fact enlarged.  It  now has a largest diameter of 4.3 cm.  There is no extension into the  subcutaneous tissue, however, and no debridement is needed.   PLAN:  We have reapplied 2 separate swaths of Oasis, moisturized them  with saline and hydrogel. We will return her to a compression wrap and  evaluate her in 1 week.      Harold A. Tanda Rockers, M.D.  Electronically Signed     HAN/MEDQ  D:  12/25/2007  T:  12/26/2007  Job:  [id number]

## 2011-01-24 NOTE — Assessment & Plan Note (Signed)
[facility]   NAMEHARUMI, [name]               ACCOUNT NO.:  [id number]   MEDICAL RECORD NO.:  [id number]      DATE OF BIRTH:  04-06-1946   PHYSICIAN:  Jonelle Sports. Sevier, M.D.  VISIT DATE:  08/12/2008                                   OFFICE VISIT   HISTORY:  This 65 year old white female with diabetes has been followed  for stasis ulcerations on the anterolateral and lateral aspect of the  left ankle, which have been problematic off and on for a number of years  and have been treated with Apligraf applications on several occasions  most recently about 2-1/2 months ago.  She has never fully healed but  has shown progressive improvement with only 2 open areas remaining.   She reports since her last visit she has had some itching but otherwise  no problematic symptoms.   PHYSICAL EXAMINATION:  Blood pressure is 136/80, pulse 68, respirations  16, temperature 97.8.  Random blood glucose 122 mg/dL.  She now has 2  open lesions on the lateral ankle more proximal of which measures 0.4 x  0.2 x 0.1 cm and the other 0.8 x 0.9 x 0.1 cm, both of which are  somewhat smaller than before.  These ulcers have clean bases and no  slough accumulation and some evidence of continuing epithelialization  from the margins.   In addition on the posterior aspect of that lower leg and again  anteromedially higher up, she has evidence for erythematous patches  which appear to be reactive in nature with some suspicion of excoriation  on the more proximal lesion, although she denies this.   IMPRESSION:  Slow with satisfactory course, stasis ulceration, left  lower extremity.   DISPOSITION:  The wounds today required no debridement.   Both wounds will be dressed with an application of Puracol, SofSorb, and  the extremity placed in a Profore wrap.   Prior to doing this, the areas of reactive skin change are treated with  an application of 0.1% triamcinolone cream.   Followup visit  will be here in 1 week.           ______________________________  Jonelle Sports. Cheryll Cockayne, M.D.     RES/MEDQ  D:  08/12/2008  T:  08/12/2008  Job:  [id number]

## 2011-01-24 NOTE — Assessment & Plan Note (Signed)
[facility]   NAMESTEFHANIE, [name]               ACCOUNT NO.:  [id number]   MEDICAL RECORD NO.:  [id number]      DATE OF BIRTH:  [birth year]   PHYSICIAN:  Jonelle Sports. Sevier, M.D.  VISIT DATE:  11/04/2008                                   OFFICE VISIT   HISTORY:  This 65 year old white female with chronic venous  insufficiency has been followed for a what had been a rather refractory  ulceration on the lateral malleolar aspect of the left ankle but which  now is very near healing following a series of 2 Apligraf applications a  number of weeks back.   Since her last visit, she has also noted tiny open area on the  anteromedial aspect of the left lower extremity just proximal to the  anterior ankle crease.   EXAMINATION:  Blood pressure 133/79, pulse 65, respirations 18,  temperature 98.4.  Capillary blood glucose 126 mg%.   The persisting tiny area of ulceration on the left lateral malleolar  area measures 0.3 x 0.2 x 0.1 cm and is free of slough with a fairly  clean base.   The new lesion on the anterior right lower leg measures 0.2 x 0.2 x 0.1  cm.   Both of these ulcers will be covered with Silverlon pads, and both  extremities will be placed in Profore wraps.   She will be seen in 1 week by the nurse for dressing change and 2 weeks  by the physician in followup.           ______________________________  Jonelle Sports. Cheryll Cockayne, M.D.     RES/MEDQ  D:  11/04/2008  T:  11/05/2008  Job:  [id number]

## 2011-01-24 NOTE — Assessment & Plan Note (Signed)
[facility]   NAMEKAILIANA, GRANQUIST               ACCOUNT NO.:  [id number]   MEDICAL RECORD NO.:  [id number]      DATE OF BIRTH:  October 20, 1945   PHYSICIAN:  Jonelle Sports. Sevier, M.D.  VISIT DATE:  09/16/2008                                   OFFICE VISIT   HISTORY:  This 65 year old white female who has been followed for long-  standing left lateral ankle stasis ulceration which has finally  responded to a second application of Apligraf is very near healed.  She  reports that since her last visit she has had no new problems.  She has  been on a Profore wrap which has been well tolerated.  She has noted  some posterior itching at that ankle but nothing else to concern.   EXAMINATION:  Blood pressure 133/77, pulse 68, respirations 20,  temperature 98.  The only persisting wound now at her left lateral  supramalleolar area measures 0.6 x 0.4 x 0.1 cm and is partially  epithelialized.   The area of itching that she indicates on the posterior aspect of that  to distal calf appears to be simply irritated dry skin.   IMPRESSION:  Near healing stasis ulcer left lateral malleolar area.   DISPOSITION:  The wound requires no debridement.  It is dressed today  with an application of Puracol, SofSorb pad and that extremity placed in  a Profore wrap.   A&D ointment was used widely to the peri-wound area and to the posterior  area of irritation and itching in hopes this will head off in the future  problems there.   The patient will return in 1 week for dressing change by the nurse and  in 2 weeks to the physician.           ______________________________  Jonelle Sports. Cheryll Cockayne, M.D.     RES/MEDQ  D:  09/16/2008  T:  09/17/2008  Job:  [id number]

## 2011-01-24 NOTE — Assessment & Plan Note (Signed)
[facility]   NAMEELIZABETH, [name]               ACCOUNT NO.:  [id number]   MEDICAL RECORD NO.:  [id number]      DATE OF BIRTH:  11-23-1945   PHYSICIAN:  Theresia Majors. Tanda Rockers, M.D. VISIT DATE:  05/02/2007                                   OFFICE VISIT   SUBJECTIVE:  The patient is a 65 year old diabetic who is well-known to  the wound center.  She was last seen approximately a year ago with a  resolved stasis ulcer on the lateral aspect of the left lower extremity.  Over the last 3 weeks, she has been extremely active and has not worn  her compression.  She has noted a small lesion which is broken down and  has erupted full fledge, and is essentially returned in the same  magnitude as prior to her discharge.  There has been no fever.  She has  had moderate swelling and itching.  There has been on breakdown on the  right lower extremity.  She continues under the care of Dr. Nicholos Johns.   OBJECTIVE:  Blood pressure is 148/65, respirations 18, pulse rate 65,  temperature 97.7, capillary blood glucose is 143 mg%.  Inspection of the  lower extremities shows bilateral 2+ edema.  There are no ulcerations on  the right lower extremity, and there is palpable dorsalis pedis pulse.  On the left, the dorsalis pedis pulse is indeterminate.  There is a  large, healthy appearing stasis ulcer with well-perfused granulation  tissue, moderate clear drainage, and no evidence of lymphangitis or  ascending cellulitis.  The ankle brachial index is 0.8 and 1.2 in the  left and right ankles respectively.  Sensation is moderately impaired.   ASSESSMENT:  Recurrent stasis ulcer.   PLAN:  We will resume the use of a Silverlon swath and a Profore wrap  for compression.  We will add Ultra Mide for skin conditioning.  We will  reevaluate the patient in 1 week.      Harold A. Tanda Rockers, M.D.  Electronically Signed     HAN/MEDQ  D:  05/02/2007  T:  05/02/2007  Job:  [id number]

## 2011-01-24 NOTE — Assessment & Plan Note (Signed)
[facility]   [name], [name]               ACCOUNT NO.:  [id number]   MEDICAL RECORD NO.:  [id number]      DATE OF BIRTH:  09-18-1945   PHYSICIAN:  Maxwell Caul, M.D.      VISIT DATE:                                   OFFICE VISIT   [name] is a lady that we have been following here for severe venous  stasis involving her left lateral leg.  She has been treated with a  Silverlon swath, Ultra-Mide and a Profore.  She has been seen weekly by  the nurse and every other week by the physician.  We have had good  control of her edema.  She reports some itching but no drainage,  malodor, pain or fever.   On examination, temperature is 98, pulse 65, respirations 18, blood  pressure 141/83.  The previous wound on the lateral aspect of her left  leg is 100% re-epithelialized and appears resolved.  She has a small  open area on the left anterior leg that was covered with macerated skin  which I removed.  There continues to be a small wound in this area.  There is good edema control, but there is some lymphedema in her toes.   IMPRESSION:  Continued improvement with venous stasis ulceration.   I have applied Prism and hydrogel to the small open area on her left  anterior leg.  I think we are well on the way to healing here and I  suspect in the next week or two this will heal over and she can be  discharged at that point.           ______________________________  Maxwell Caul, M.D.     MGR/MEDQ  D:  03/05/2008  T:  03/06/2008  Job:  [id number]

## 2011-01-24 NOTE — Assessment & Plan Note (Signed)
[facility]   NAMEZAIDE, MCCLENAHAN               ACCOUNT NO.:  [id number]   MEDICAL RECORD NO.:  [id number]      DATE OF BIRTH:  1946/03/24   PHYSICIAN:  Jonelle Sports. Sevier, M.D.  VISIT DATE:  01/22/2008                                   OFFICE VISIT   This 65 year old white female has been followed for rather refractory  stasis ulceration on the lateral aspect of the left ankle.  She had been  treated with Apligraf application fairly recently but had the reopening  the ulcer and it was found to have superficial non-MRSA Staph.  Accordingly, she has had Silverlon added to her dressings since that  time and has remained in Profore wrap.   She arrives today, admitting to a very stressful week and that her  husband has been in the hospital with open heart surgery.  So, she has  done lot more walking than usual.  In view of that once her wrap was  removed, it was found that she had a new lesion rubbed on the distal mid  pretibial area on that left lower extremity.  She reports no increased  pain, no fever, no chills, no particular problems rather than the  persistence of her primary lesion in this new lesion.   EXAMINATION:  Blood pressure 133/67, pulse 77, respirations 18, and  temperature 97.6.  Capillary blood glucose 82.  The area of the primary  ulcer on the left lower extremity.  Lateral malleolar area is indeed  healed but has a very thin eschar over a small area.   The new lesion on the left anterior pretibial area now measures 0.9 x  0.6 x 0.1 cm and has a clean base.  There is some loose skin around its  margins.   IMPRESSION:  Continued improvement primary lesion and new lesions  secondary to increased activity while in the wrap.   DISPOSITION:  The eschar on the primary lesion is selectively debrided  and this shows a lesion to be healed.  Because this debridement caused  her brief amount of bleeding; however, we have placed a Silverlon pad  again in  that area.   The loose skin around the new lesion was sharply pared away without  incident.  That wound was likewise covered with Silverlon.  The  extremity in general was treated with an application of UltraMide 25 and  that extremity again placed in a Profore wrap.  Followup visit will be  in 1 week  to the nurse.  The dressing change in 2 weeks to the  physician.           ______________________________  Jonelle Sports. Cheryll Cockayne, M.D.     RES/MEDQ  D:  01/22/2008  T:  01/23/2008  Job:  [id number]

## 2011-01-24 NOTE — Assessment & Plan Note (Signed)
[facility]   [name], [name]               ACCOUNT NO.:  [id number]   MEDICAL RECORD NO.:  [id number]      DATE OF BIRTH:  01-May-1946   PHYSICIAN:  Theresia Majors. Tanda Rockers, M.D. VISIT DATE:  01/02/2008                                   OFFICE VISIT   SUBJECTIVE:  [name] is a 66 year old lady who we are following for  stasis ulcer involving her left lateral leg.  We have treated her with  an Oasis protocol.  In the interim, she has complained of increasing  drainage and pain.  She has had no fever and has been no excessive  malodor.  She has reported significant swelling.   OBJECTIVE:  VITAL SIGNS:  Blood pressure is 131/77, respirations 16,  pulse rate 78, and temperature 98.1.  EXTREMITIES:  Inspection of the left lower extremity shows that there is  2+ edema.  There are prominent veins compared to her previous exams.  There is no popliteal tenderness.  The area of the ulcer has definitely  increased.  There has been a moderate amount of serous proteinaceous  drainage with hyperemia.  Capillary refill remains brisk.  There is no  evidence of ischemia.   ASSESSMENT:  Deterioration of stasis ulcer.   PLAN:  We will discontinue the Oasis protocol.  We will cleanse the  wound with saline and culture was taken from the depths of the wound.  We will reinitiate a silver lined swath with a multilayered compressive  wrap.  We will re-evaluate the patient in 5 days.  We will behold the  decision for antibiotics.  Pending, the culture reports.      Harold A. Tanda Rockers, M.D.  Electronically Signed     HAN/MEDQ  D:  01/02/2008  T:  01/03/2008  Job:  [id number]

## 2011-01-24 NOTE — Assessment & Plan Note (Signed)
[facility]   NAMEKHYLEIGH, [name]               ACCOUNT NO.:  [id number]   MEDICAL RECORD NO.:  [id number]      DATE OF BIRTH:  11/16/1945   PHYSICIAN:  Maxwell Caul, M.D.      VISIT DATE:                                   OFFICE VISIT   Ms. Ham returns today in followup for venous stasis ulcerations of  her left lateral lower leg.  We have had problems with the wounds in the  past, and eventually were able to heal these, but she reopened in July.  She has had both Oasis and Apligraf in the past.  She had an Oasis  applied last week and she returns today in followup.  I had been  concerned about cellulitis on May 25, 2008.  She is completing a 2-  week course of doxycycline.  She has some drainage, but no real pain and  she has remained systemically well.   On examination, her temperature is 98.2, pulse 65.  She has a bridged  wound on the left lateral ankle, which measures 4.7 x 4.5 x 0.2.  This  appears granulated with some epithelialization at the margins, did not  need debridement.  The area of cellulitis is completely resolved in fact  you can still see where I marked the area.   IMPRESSION:  Largely venous stasis wound, left lateral leg.  Her  cellulitis is resolved.  She does not need any further antibiotics.  I  reapplied the Oasis.  I have asked for reapplication of an Apligraf,  perhaps 2 applications will be necessary.  It took an Apligraf to start  the process of healing last time, although I think ultimately this  healed with a silver dressing and a peripheral wrap.           ______________________________  Maxwell Caul, M.D.     MGR/MEDQ  D:  06/01/2008  T:  06/02/2008  Job:  [id number]

## 2011-01-24 NOTE — Assessment & Plan Note (Signed)
[facility]   NAMENASTASSJA, WITKOP               ACCOUNT NO.:  [id number]   MEDICAL RECORD NO.:  [id number]      DATE OF BIRTH:  1946/04/06   PHYSICIAN:  Theresia Majors. Tanda Rockers, M.D.      VISIT DATE:                                   OFFICE VISIT   SUBJECTIVE:  Ms. Aderhold is a 65 year old lady who underwent an Apligraf  placement onto the lateral stasis ulcer 3 weeks ago.  There has been no  excessive drainage, malodor, pain or fever.  She continues to be  ambulatory.   OBJECTIVE:  Blood pressure is 140/70, respirations 18, pulse rate 69,  temperature 97.8, capillary blood glucose is under 135 mg/percent.  She  is accompanied by husband.  Inspection of the ulcer shows that there is  a 100% granulated base with advancing epithelium on the lateral left  lower extremity.  The capillary refill is brisk.  There is no evidence  of a ascending infection, cellulitis or abscess.  There is no evidence  of ischemia.   ASSESSMENT:  Clinical improvement with compression post Apligraf.   PLAN:  We will return the patient to a profore wrap with triamcinolone  cream.  We will reevaluate her in 1 week p.r.n.      Harold A. Tanda Rockers, M.D.  Electronically Signed     HAN/MEDQ  D:  10/23/2007  T:  10/25/2007  Job:  [id number]

## 2011-01-24 NOTE — Assessment & Plan Note (Signed)
[facility]   [name], [name]               ACCOUNT NO.:  [id number]   MEDICAL RECORD NO.:  [id number]      DATE OF BIRTH:  Nov 07, 1945   PHYSICIAN:  Theresia Majors. Tanda Rockers, M.D. VISIT DATE:  05/14/2007                                   OFFICE VISIT   SUBJECTIVE:  [name] is a 65 year old lady who we are following for  a left lateral stasis ulcer.  In the interim, we have treated her with a  Silverlon swath Ultra Mide and an ABD pad and a Profore wrap.  She  reports that there has been moderate drainage and malodor, but no fever  or pain.   OBJECTIVE:  Blood pressure is 127/66, respirations 20, pulse rate 75,  temperature 97.7.  Capillary blood glucose is 195 mg%.  Inspection of  the left lateral leg shows that there has been adequate compression as  manifested by lineal wrinkles.  The granulating bed appears to be  healthy.  There is no need for debridement.  There is no evidence of  arterial compromise or infection.   PLAN:  We will return the patient to the compression wrap with the Ultra  Mide and Silverlon.  We will rewrap her in 1 week.  We have advised the  patient to call the clinic for an interim appointment if she notices  increased drainage or excessive malodor.      Harold A. Tanda Rockers, M.D.  Electronically Signed     HAN/MEDQ  D:  05/14/2007  T:  05/15/2007  Job:  [id number]

## 2011-01-24 NOTE — Assessment & Plan Note (Signed)
[facility]   NAMEJENDAYA, [name]               ACCOUNT NO.:  [id number]   MEDICAL RECORD NO.:  [id number]      DATE OF BIRTH:  26-Sep-1945   PHYSICIAN:  Jonelle Sports. Sevier, M.D.  VISIT DATE:  05/20/2008                                   OFFICE VISIT   HISTORY:  This 65 year old white female diabetic with extreme lower  extremity edema in the face of extreme obesity is followed for a  refractory ulceration on the lateral malleolar aspect of her left lower  extremity.  This has been treated in the past with traditional measures  as well as with Apligraf application.  It has been healed from time to  time, but broke down again recently and she has been under treatment  simply with Profore wraps and Silverlon patches.  She has had 2 ulcers,  one more anteriorly disposed on the other and the other more lateral and  at about the 11 o'clock position on the lateral malleolus.   She reports considerable drainage since her last visit to the extent  that she is here today 2 days early for dressing change.   PHYSICAL EXAMINATION:  VITAL SIGNS:  Blood pressure 149/64, pulse 66,  respirations 20, and temperature 98.8.  Random blood glucose 154 mg/dL.  She says recent hemoglobin A1c was 6.4%.  EXTREMITIES:  The ulcers on the left ankle has now coalesced and one  large ulcer measuring 3.5 x 5 x 0.2 cm.  This is indeed smaller than the  2 ulcers measured separately on her last visit.  The ulcer base is quite  clean and granular and there is no significant odor, no periwound  erythema or induration.   IMPRESSION:  Satisfactory progress, stasis ulceration left lower  extremity.   DISPOSITION:  The wounds today do not require debridement.   She will be redressed with an application of Silverlon covered with an  ABD pad and that extremity placed in a Profore wrap.   She will be brought back 5 days hence for dressing change by the nurse  and will be seen by the physician 9 days  hence.  At that time it will be  my recommendation that she be placed on twice weekly (Monday, Thursday  or Monday, Friday) dressing change program.   The patient has a pre-ulcerative callus underlying her left fifth  metatarsal head and this is pared today without incident.   In addition, the patient is given a courtesy reduction of all 10 nails  of her feet.   A followup visit will be as indicated above.          ______________________________  Jonelle Sports. Cheryll Cockayne, M.D.    RES/MEDQ  D:  05/20/2008  T:  05/21/2008  Job:  [id number]

## 2011-01-24 NOTE — Assessment & Plan Note (Signed)
[facility]   NAMEANTONIA, [name]               ACCOUNT NO.:  [id number]   MEDICAL RECORD NO.:  [id number]      DATE OF BIRTH:  [birth year]   PHYSICIAN:  Jonelle Sports. Sevier, M.D.  VISIT DATE:  09/25/2007                                   OFFICE VISIT   HISTORY:  This 65 year old white female with longstanding stasis  ulceration of the left lateral malleolar area is seen in routine  followup.   Several weeks ago, I had seen her and have determined that the wound was  ready for Apligraf application and had requested approval for the same.  Apparently somehow this was overlooked and such approval has not been  requested.   She returns today reporting that the wound has not given her any new  problems in terms of pain, drainage, odor, erythema, swelling, fever,  chills et Karie Soda.   EXAMINATION:  Blood pressure 136/73, respirations 18, pulse 71,  temperature 97.7.  The wound itself on the left lateral malleolar area now measures 3.0 x  to 0.1 x 0.1 cm which is essentially unchanged.  It does have a dumbbell  type appearance to it and this measurement includes both components.   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   Request will, at this time, be formerly made for approval for Apligraf.  In the interim the wound will be dressed in Puracol AG covered with a  SofSorb pad and that extremity placed in a Profore dressing.  Followup  visit will be here in 1 week.           ______________________________  Jonelle Sports. Cheryll Cockayne, M.D.     RES/MEDQ  D:  09/25/2007  T:  09/25/2007  Job:  [id number]

## 2011-01-24 NOTE — Assessment & Plan Note (Signed)
[facility]   NAMEJAZMEN, LINDENBAUM               ACCOUNT NO.:  [id number]   MEDICAL RECORD NO.:  [id number]      DATE OF BIRTH:  04-06-1946   PHYSICIAN:  Theresia Majors. Tanda Rockers, M.D. VISIT DATE:  08/13/2007                                   OFFICE VISIT   SUBJECTIVE:  [name] is a 65 year old diabetic with a stasis  ulceration involving the left lateral leg.  In the interim, we have  treated her with a Silverlon swath and Profore wrap.  She has had  unusual activity over the Thanksgiving holiday, resulting in increased  swelling and drainage.  There has been no fever.  Her blood glucoses  have been under reasonable control.  She is accompanied by her husband.   OBJECTIVE:  Blood pressure is 142/64, respirations 18, pulse rate 69,  temperature is 98.2.  Inspection of the left lower extremity shows a  persistence of 2+ edema.  The ulcer, itself, appears to be chronically  inflamed with a halo of erythema.  There is no malodor, no abscess or  cavitation.  The pedal pulse is readily palpable and the capillary  refill is brisk.  The extremity is warm, but it is not feverish. The  wound was cultured.   ASSESSMENT:  Persistent stasis ulcer.   PLAN:  We will resume the compression wrap with the Silverlon swath and  reevaluate the patient in 1 week.      Harold A. Tanda Rockers, M.D.  Electronically Signed     HAN/MEDQ  D:  08/13/2007  T:  08/13/2007  Job:  [id number]

## 2011-01-24 NOTE — Assessment & Plan Note (Signed)
[facility]   NAMEJULENE, [name]               ACCOUNT NO.:  [id number]   MEDICAL RECORD NO.:  [id number]      DATE OF BIRTH:  1946/06/06   PHYSICIAN:  Jonelle Sports. Sevier, M.D.  VISIT DATE:  12/04/2007                                   OFFICE VISIT   HISTORY:  This 65 year old white female has been followed for a  longstanding stasis ulceration and area of scarred tissue on the left  lateral supramalleolar area.  This had been in basically palliative care  until several weeks ago when we felt it was clean and that we should  make an effort at Apligraf treatment.  Accordingly Apligraf was applied  and over the next 2-3 weeks the wound completely healed.  Unfortunately,  no sooner had it done so then it began to break down again and indeed  has progressed to almost its original state again now over several weeks  despite conventional therapies.   One week ago the wound had Oasis placed and she has been kept in a  Profore wrap as well.   She reports today no awareness of change. No fever, odor, swelling,  breakthrough drainage, etc.   PHYSICAL EXAMINATION:  VITALS:  Blood pressure 121/68, pulse 65,  respirations 16, temperature 97.7.  EXTREMITIES:  The ulcer now measures 2.0 x 1.8 x 0.1 cm. Has a  reasonably clean base, minimal hypergranulation and no odor.   IMPRESSION:  Recurrent but stable stasis ulceration left lateral  supramalleolar area.   DISPOSITION:  Due to her lack of success with other measures at this  point might be reasonable to try Regranex protocol on the patient. This  is discussed with her at some length and she is amenable provided it can  be obtained at an acceptable cost.  She is accordingly  today given a  prescription for Regranex and she will research this with her pharmacy  and others if necessary to see if it is something that she could be able  to afford.   In the meanwhile will return the wound to repeat dressing with Oasis  and  see if any further improvement ensues.   Accordingly in the usual manner Oasis was applied to the wound then  saturated with saline, held in place with Steri-Strips.  It is covered  then with a layer of Mepitel over which is placed a moist gauze. This  inside a dressing enclosure that should keep the wound moist.  The  extremity is then returned to the Profore wrap.   Follow-up visit will be here in 7 days.           ______________________________  Jonelle Sports Cheryll Cockayne, M.D.     RES/MEDQ  D:  12/04/2007  T:  12/05/2007  Job:  [id number]

## 2011-01-24 NOTE — Assessment & Plan Note (Signed)
[facility]   NAMEWILMA, MICHAELSON               ACCOUNT NO.:  [id number]   MEDICAL RECORD NO.:  [id number]      DATE OF BIRTH:  01/28/1946   PHYSICIAN:  Jonelle Sports. Sevier, M.D.  VISIT DATE:  10/09/2007                                   OFFICE VISIT   HISTORY:  This 66 year old white female has been followed in this clinic  intermittently for a number of years with a persistent and recurrent at  times ulceration in the lateral malleolar area of the left lower  extremity.  This is a site of old trauma and there is very abnormal skin  in that area and it has also been impossible to render this extremity  completely free of edema.   Nonetheless with a wound having cleaned up nicely last week, a decision  was made to apply Apligraf to the wound and that was accomplished at  that time with no difficulty.   This is her first visit since the placement of Apligraf.  She reports  some itching right in the area of the wound itself but no true pain.  No  fever, chills or systemic symptoms.  She had been unaware of any  significant odor.   PHYSICAL EXAMINATION:  VITALS:  Blood pressure 142/83, pulse 67,  respirations 20, temperature 97.7.   The wound itself is not revealed today but the outer nonstick pad over  the Apligraf appears to have stayed in position fairly well and the  accumulation of material beneath the dressing would appear standard for  this stage following an Apligraf application.  There is no significant  odor.   Accordingly the wound is simply covered again with a soft protective pad  and that extremity is returned to a Profore wrap to the knee.   The skin of the remainder of the leg is treated with an application of  Ultramide 25 prior to the placement of the wrap because of some more  generalized itching (without rash) which the patient reports.   In addition, the patient points out two areas that appear to be clearly  fungal infection on the  anterior and proximal medial aspect of the right  lower extremity.  She is given a prescription for Mycolog cream or the  equivalent to use in that area.   Follow-up visit will be here in 1 week.           ______________________________  Jonelle Sports. Cheryll Cockayne, M.D.     RES/MEDQ  D:  10/09/2007  T:  10/10/2007  Job:  [id number]

## 2011-01-24 NOTE — Assessment & Plan Note (Signed)
[facility]   NAMEBESS, SALTZMAN               ACCOUNT NO.:  [id number]   MEDICAL RECORD NO.:  [id number]      DATE OF BIRTH:  1945-12-10   PHYSICIAN:  Theresia Majors. Tanda Rockers, M.D. VISIT DATE:  07/15/2007                                   OFFICE VISIT   SUBJECTIVE:  Ms. Karren returns for followup of a stasis ulcer of her  lateral left lower extremity.  In the interim, we treated her with a  Silverlon swath Profore and Ultra Mide cream.  There has been no  excessive drainage, malodor, pain, or fever.  The patient continues to  be ambulatory.  There has been no excessive swelling.   OBJECTIVE:  VITAL SIGNS:  Blood pressure is 138/89, respirations 20,  pulse rate 75, temperature is 97.6.  Capillary blood glucose is 113 mg  percent.  EXTREMITIES:  Inspection of the wound shows that there has been  contraction.  There is now healthy-appearing granulation with advancing  epithelium from the periphery.  There is decrease in area and volume.  There is no evidence of ischemia or active infection.   ASSESSMENT:  Clinical improvement with compression.   PLAN:  We are returning the patient to a Profore wrap, Ultra Mide,  Silvalon swath.  We will reevaluate her in 1 week.      Harold A. Tanda Rockers, M.D.  Electronically Signed     HAN/MEDQ  D:  07/15/2007  T:  07/16/2007  Job:  [id number]

## 2011-01-24 NOTE — Assessment & Plan Note (Signed)
[facility]   NAMEOLIVETTE, BECKMANN               ACCOUNT NO.:  [id number]   MEDICAL RECORD NO.:  [id number]      DATE OF BIRTH:  [birth year]   PHYSICIAN:  Jonelle Sports. Sevier, M.D.  VISIT DATE:  07/15/2008                                   OFFICE VISIT   HISTORY:  This 65 year old white female who has been followed for very  refractory a lateral malleolar area ulcer in association with chronic  venous insufficiency and some degree of lymphedema.  She had a second  application of Apligraf, I believe, 5 weeks ago today and has shown a  good take with that but with 2 tiny persisting areas as yet unclosed.  She had some trouble with her Profore wrap during the past week (which  is not unusual story for her), took it off, and replaced with an Ace  wrap using some Silverlon that she had on hand directly over the wound.   She has had no fever, chills, or systemic symptoms noted, no excess  drainage and no odor.   Blood pressure 130/76, pulse 62, respirations 20, and temperature 97.8.  Capillary blood glucose 122 mg/dL.   The more proximal of the 2 areas remain open on this left lateral  malleolar area measures 1.0 x 0.7 x 0.1 cm and has a clean and granular  base.  The smaller area is 0.3 x 0.2 x 0.1 cm, is more distally disposed  and again has a good granular base.   IMPRESSION:  Satisfactory course of chronic and recurrent stasis ulcer,  left lateral malleolar area.   DISPOSITION:  The wounds are both debrided of a small amount of loose  skin to the adjacent areas and right up to the wound margins.  This is a  selective debridement.   They are then dressed with an application of Puracol, and the patient is  returned to a Profore wrap.   Followup visit here will be in 1 week.           ______________________________  Jonelle Sports. Cheryll Cockayne, M.D.     RES/MEDQ  D:  07/15/2008  T:  07/15/2008  Job:  [id number]

## 2011-01-24 NOTE — Assessment & Plan Note (Signed)
[facility]   [name], [name]               ACCOUNT NO.:  [id number]   MEDICAL RECORD NO.:  [id number]      DATE OF BIRTH:  [birth year]   PHYSICIAN:  Maxwell Caul, M.D.      VISIT DATE:                                   OFFICE VISIT   [name] was last seen here on April 24, 2008.  She was put in a  Profore wrap with the Silverlon over her wound beds.  For reasons are  not really clear, the wrap was removed 2-3 days ago.  She arrives with  resultant large amount of swelling and some maceration of the underlying  wound bed.   On examination, temperature is 98, blood pressure 124/62.  Her CBG is  134.  The 2 major wounds are over the left anterior lower leg and left  lateral ankle.  Both of these required difficult debridements of tight  adherent eschar (see debridement sheet).  This required removal of some  subcutaneous tissue.  Bleeding was controlled with direct pressure.   IMPRESSION:  Deteriorating venous stasis ulcerations related to  inadequate edema control.  There was no evidence of infection here.   These wounds were debrided.  The beds of the wounds look cleaner  afterwards.  We continued the current Silverlon topically to the wounds  with Profore wraps.  We will see her again in 10-11 days.           ______________________________  Maxwell Caul, M.D.     MGR/MEDQ  D:  05/08/2008  T:  05/09/2008  Job:  [id number]

## 2011-01-24 NOTE — Assessment & Plan Note (Signed)
[facility]   NAMEERCIA, CRISAFULLI               ACCOUNT NO.:  [id number]   MEDICAL RECORD NO.:  [id number]      DATE OF BIRTH:  Jul 22, 1946   PHYSICIAN:  Theresia Majors. Tanda Rockers, M.D. VISIT DATE:  09/09/2007                                   OFFICE VISIT   SUBJECTIVE:  Ms. Hertenstein is a 65 year old lady who we are treating for a  stasis ulcer involving the lateral malleolus of the left lower  extremity.  During the holidays she was excessively active and  experienced increased edema and drainage.  There was no fever, she  continues to be ambulatory.   OBJECTIVE:  Blood pressure is 146/71, respirations 18, pulse rate 74,  temperature 97.5, capillary blood glucose is 104 mg percent.  She is  accompanied by her husband.  Inspection of the wound shows a persistence  of 2 to 3+ edema.  The wound itself is 100% granulated with a shaggy  exudate on the periphery, no debridement was needed.  There is no  evidence of ascending infection, capillary refill is brisk.   ASSESSMENT:  Persistent stasis ulcer.   PLAN:  We will resume use of the Silverlon swath with hydrogel, Ultra-  Mide for skin conditioning and a Profore.  We will re-evaluate the  patient in 1 week.      Harold A. Tanda Rockers, M.D.  Electronically Signed     HAN/MEDQ  D:  09/09/2007  T:  09/09/2007  Job:  [id number]

## 2011-01-24 NOTE — Assessment & Plan Note (Signed)
[facility]   NAMEROSEALEE, RECINOS               ACCOUNT NO.:  [id number]   MEDICAL RECORD NO.:  [id number]      DATE OF BIRTH:  1946/02/08   PHYSICIAN:  Theresia Majors. Tanda Rockers, M.D. VISIT DATE:  06/06/2007                                   OFFICE VISIT   SUBJECTIVE:  Halo Shevlin is a 65 year old lady who was are seeing for  stasis ulcers involving the lateral left lower extremity and also an  area of excoriation on the anterior surface.  In the interim, she  removed her compression wrap and reapplied it.   OBJECTIVE:  VITAL SIGNS:  Blood pressure is 138/68, respirations 18,  pulse rate 66, temperature 97.6.  EXTREMITIES:  Inspection of the lower extremities shows that wound  number 5 is now completely resolved.  There is no evidence of residual  excoriation.  Wound number 1B shows evidence of volume decrease.  There  is no active infection.  There is healthy appearing granulation.   IMPRESSION:  Improved stasis ulcer.   PLAN:  We will return the patient to a Profore wrap utilizing ultra lite  lotion and a Silverlon swath over the wound.  We will have the patient  seen q. weekly by the nurses for 3 weeks and the physician will  reevaluate her in 1 month.      Harold A. Tanda Rockers, M.D.  Electronically Signed     HAN/MEDQ  D:  06/06/2007  T:  06/07/2007  Job:  [id number]

## 2011-01-24 NOTE — Assessment & Plan Note (Signed)
[facility]   [name], [name]               ACCOUNT NO.:  [id number]   MEDICAL RECORD NO.:  [id number]      DATE OF BIRTH:  [birth year]   PHYSICIAN:  Maxwell Caul, M.D.      VISIT DATE:                                   OFFICE VISIT   Ms. Raybuck had an Apligraf placed last week to the area on the left  lateral ankle.  This is a largely venous stasis wound.  She returns  today stating she is not having any pain or fever, however, she did note  malodorous drainage.   On examination, she is afebrile with a temperature of 98.8.  The area on  the left lateral ankle does not appear to be infected, although there is  some erythema around this.  I think it is more contact than anything  else.   I will change her topical dressing to a contact layer under her Profore.  I did not start her on antibiotics.  Nothing appeared to require  culturing.  The area was not debrided and what I can see things actually  look satisfactorily improved.           ______________________________  Maxwell Caul, M.D.     MGR/MEDQ  D:  06/18/2008  T:  06/19/2008  Job:  [id number]

## 2011-01-24 NOTE — Assessment & Plan Note (Signed)
[facility]   NAMEKIRAN, LAPINE               ACCOUNT NO.:  [id number]   MEDICAL RECORD NO.:  [id number]      DATE OF BIRTH:  [birth year]   PHYSICIAN:  Theresia Majors. Tanda Rockers, M.D. VISIT DATE:  07/30/2007                                   OFFICE VISIT   SUBJECTIVE:  Ms. Van is a 66 year old lady who we have followed for  a stasis ulceration involving the lateral left leg for several months.  Most recently we have treated her with a silver lined swath and Pro four  wrap and Ultra Mide skin conditioner.  Since her last visit she  continues to be ambulatory.  There has been no excessive pain, malodor  or drainage.   OBJECTIVE:  Blood pressure is 133/63, respirations 20, pulse rate 63,  temperature 97.6, capillary blood glucose is 170 mg percent.  Inspection  of the lower extremity shows a persistence of 2-3 plus edema with  chronic changes of stasis. The ulcer itself is decreased in area and  volume.  There is scant drainage.  There is 100% granulation with  advancing epithelium. The capillary refill is brisk.  There is a faint  pedal pulse.   ASSESSMENT:  Continued improvement, response to compression therapy.   PLAN:  We will continue the compression wrap Ultra Mide a silver lined  swath. We will reevaluate the patient in 10 days.      Harold A. Tanda Rockers, M.D.  Electronically Signed     HAN/MEDQ  D:  07/30/2007  T:  07/31/2007  Job:  [id number]

## 2011-01-24 NOTE — Assessment & Plan Note (Signed)
[facility]   NAMEMAILYN, STEICHEN               ACCOUNT NO.:  [id number]   MEDICAL RECORD NO.:  [id number]      DATE OF BIRTH:  09/13/1945   PHYSICIAN:  Jonelle Sports. Sevier, M.D.  VISIT DATE:  10/16/2007                                   OFFICE VISIT   HISTORY:  This 65 year old white female has been followed for refractory  venous stasis ulceration in an area of skin that is surgically and  traumatically abnormal on the left anterior ankle, anterolateral ankle,  just proximal to and medial to the lateral malleolus.  The ulcer  underwent Apligraf application two weeks ago, and this is the first full  unveiling of the wound.   The patient reports some itching in the area beneath her compressive  wrap but no real problems.  No pain, no odor, no awareness of any  increase in drainage, no fever or systemic symptoms.   PHYSICAL EXAMINATION:  VITAL SIGNS:  Blood pressure 143/67, pulse 72,  respirations 16, temperature 97.8.  The wound now measures 4.7 x 2.6 x  0.2 cm which actually is a bit larger than the nurses last recorded  measurement, but to my eye this wound looks smaller than I remember, and  I am concerned today's measurement may not be entirely accurate.  I also  think that 2-3 little shiny areas of possible epithelization islands are  noted in the wound itself.   IMPRESSION:  Satisfactory course, chronic stasis ulceration left ankle  with possible take to recently applied Apligraf.   DISPOSITION:  1. Because the Apligraf situation may yet be a bit fragile, the wound      is not manipulated at all.  It is relatively clean and frankly no      manipulation seems necessary.  It will be covered today with a      Silverlon pad anchored with Steri-Strips.  The itching aspects of      the leg though will be treated with an application of 0.1%      triamcinolone cream and the extremity placed in a Profore wrap.   Follow-up visit will be here in one week.           ______________________________  Jonelle Sports. Cheryll Cockayne, M.D.     RES/MEDQ  D:  10/16/2007  T:  10/17/2007  Job:  [id number]

## 2011-01-24 NOTE — Assessment & Plan Note (Signed)
[facility]   NAMENAHOMI, HEGNER               ACCOUNT NO.:  [id number]   MEDICAL RECORD NO.:  [id number]      DATE OF BIRTH:  05/23/1946   PHYSICIAN:  Maxwell Caul, M.D. VISIT DATE:  05/25/2008                                   OFFICE VISIT   [name] is a 65 year old woman with diabetes who is being followed  here for recurrent venous stasis ulcer most recently on the left lateral  ankle.  She is having Silverlon and an ABD under Profore wrap.  She  returns today without any specific complaints.  She has some drainage,  but no significant pain.   PHYSICAL EXAMINATION:  VITAL SIGNS:  Temperature is 98.6, pulse 74,  respirations 20, and blood pressure 131/75.  EXTREMITIES:  The wounds are on the left lateral ankle which are  bridged.  The concern here was that she appeared to have some degree of  erythema over the anterior leg over the tibia.  I am not convinced that  this is not simply wrap irritation or wrap injury.  Nevertheless, the  appearance could be compatible with an early cellulitis.  The wounds  themselves were granulated and epithelializing, certainly nothing needed  debriding or culture.   IMPRESSION:  Venous stasis ulcers.  I returned her to Silverlon over the  wounds with an ABD pad under her Profore.  I did put her on doxycycline  for the concern about lower extremity cellulitis, although as mentioned,  I think this was probably all wrap irritation.  She is to be followed on  Friday of this week.           ______________________________  Maxwell Caul, M.D.     MGR/MEDQ  D:  05/25/2008  T:  05/26/2008  Job:  [id number]

## 2011-01-24 NOTE — Assessment & Plan Note (Signed)
[facility]   [name], [name]               ACCOUNT NO.:  [id number]   MEDICAL RECORD NO.:  [id number]      DATE OF BIRTH:  1946-01-02   PHYSICIAN:  Jonelle Sports. Sevier, M.D.  VISIT DATE:  11/13/2007                                   OFFICE VISIT   HISTORY:  This 65 year old white female with a longstanding stasis  ulceration on the lateral aspect of her left ankle and area previously  scarred skin is seen after a week interval.  She had an application  approximately 1 month ago of an Apligraf and since that time has  progressively healed.  She reports today that her wound to her appears  healed once the wrap has been removed.   PHYSICAL EXAMINATION:  VITALS:  Blood pressure 144/70, pulse 72,  respirations 18, temperature 97.1.  Self monitor blood glucose at home  252 mg/dL.  EXTREMITIES:  The wound on the lateral aspect of the left ankle is  indeed completely healed.  There is still some slight scaliness of the  skin and the area still looks rather fragile.   IMPRESSION:  Resolution of chronic stasis ulceration left lower  extremity.   DISPOSITION:  1. To allow the area to toughen, the patient is placed back into a      Profore wrap. This after treatment of that entire extremity with an      application of Ultramide and also with placement of a posterior      protective pad over the Achilles tendon.  2. Follow-up visit will be in 1 week at which time it is anticipated      she can be released again from this clinic.           ______________________________  Jonelle Sports. Cheryll Cockayne, M.D.     RES/MEDQ  D:  11/13/2007  T:  11/14/2007  Job:  [id number]

## 2011-01-24 NOTE — Assessment & Plan Note (Signed)
[facility]   [name], [name]               ACCOUNT NO.:  [id number]   MEDICAL RECORD NO.:  [id number]      DATE OF BIRTH:  [birth year]   PHYSICIAN:  Leonie Man, M.D.    VISIT DATE:  05/10/2009                                   OFFICE VISIT   This is a initial visit for this patient who was [facility] in March 2010.   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  Wound dimensions are 0.5 x 0.5 x 0.1.   The patient is a 65 year old female last seen in the [facility]  in March 2010, for treatment of venous leg ulcers.  She now presents  with a recurrence at the left ankle.  The patient is self-referred,  wishing to be treated before the ulcer enlarges to its previous size.   ALLERGIES:  She is allergic to IBUPROFEN, which caused a rash and to  NEOSPORIN, which causes erythema.   CURRENT MEDICATIONS:  1. Metformin.  2. Glipizide.  3. Trental.  4. Bumex.  5. Hydrochlorothiazide.  6. Novolin.   PAST MEDICAL HISTORY:  1. Insulin-dependent diabetes mellitus.  2. Glaucoma.  3. Cataracts.   PAST SURGICAL HISTORY:  Application of Apligraf for venous leg ulcer.   SOCIAL HISTORY:  Married white English-speaking female.  No tobacco,  alcohol, or recreational drug use.   REVIEW OF SYSTEMS:  No history of cancer.  No history of chronic renal  failure.  No hematologic diseases.   PHYSICAL EXAMINATION:  VITAL SIGNS:  Temperature 98.9, pulse 70,  respirations 16, blood pressure 138/85.  The patient stands at 5 feet 6  inches.  She weighs 279 pounds.  HEENT/NECK:  Pupils are anicteric.  Oropharynx is benign.  No palpable  thyromegaly.  No cervical adenopathy.  CHEST:  Clear to auscultation bilaterally.  HEART:  Regular rate and rhythm.  No murmurs, rubs, gallops.  ABDOMEN:  Soft, nontender, nondistended.   Normoactive bowel sounds.  No  palpable masses or visceromegaly.  No evidence of hernias.  EXTREMITIES:  Small venous leg ulcer at the lateral left ankle, which is  clean or lessen without exudate.  Pulses are palpable.   ASSESSMENT:  Venous leg ulcer in this diabetic female with well-  controlled diabetes.  This represents a recurrent ulcer.   TREATMENT:  Silverlon with Profore Lite wrap.   DISPOSITION:  Followup in 1 week.      Leonie Man, M.D.  Electronically Signed     PB/MEDQ  D:  05/10/2009  T:  05/11/2009  Job:  [id number]

## 2011-01-24 NOTE — Assessment & Plan Note (Signed)
[facility]   NAMEALIENA, [name]               ACCOUNT NO.:  [id number]   MEDICAL RECORD NO.:  [id number]      DATE OF BIRTH:  1946/04/28   PHYSICIAN:  Jonelle Sports. Sevier, M.D.  VISIT DATE:  09/30/2008                                   OFFICE VISIT   HISTORY:  This 65 year old diabetic female is followed for stasis  ulcerations of her left ankle, which have occurred in conjunction with  some deformity of that ankle and have proved rather refractory both in  the past and on this recent bout.  She has finally resolved after  application of Apligraf x2.   Today, she has just 2 tiny areas that are covered with small scabs but  no other evidence of ulceration.  She has been in a Profore wrap for 2  weeks since last seen.   EXAMINATION:  Blood pressures 119/73, pulse 64, respirations 16,  temperature 97.7.  Random blood glucose this morning 114 mg/dL.  The  wounds on the left ankle, the more proximal one measures 0.6 x 0.3 x 0.1  after the crust is removed and is quite superficial, the more distal one  measures 0.3 x 0.2 x 0.1 after the crust is removed, and it is a little  more punched out in appearance.   IMPRESSION:  Satisfactory course of chronic stasis ulceration, left  ankle.   DISPOSITION:  1. The wounds are debrided of their crusts selectively so as indicated      above.  2. There are then dressed with an application of Puracol and covered      with an absorptive pad and that extremities were returned to a      Profore wrap.   Followup visit will be in 2 weeks with the anticipation that she quite  likely will be completely healed by that time.           ______________________________  Jonelle Sports Cheryll Cockayne, M.D.     RES/MEDQ  D:  09/30/2008  T:  10/01/2008  Job:  [id number]

## 2011-01-24 NOTE — Assessment & Plan Note (Signed)
[facility]   NAMEMALONI, [name]               ACCOUNT NO.:  [id number]   MEDICAL RECORD NO.:  [id number]      DATE OF BIRTH:  05/04/1946   PHYSICIAN:  Jonelle Sports. Sevier, M.D.  VISIT DATE:  07/01/2008                                   OFFICE VISIT   HISTORY:  This 65 year old white female who has been known to this  clinic for a long time with a refractory venous ulceration on the left  lateral malleolar area.  We gotten this healed once with Apligraf  application, but it broke down again pretty quickly and it has persisted  despite multiple conservative measures.   A repeat Apligraf was placed on this wound on June 11, 2008, that is  to say 3 weeks ago and she has done generally satisfactory.  When seen 1  week ago, it was noted that she had a superficial staphylococcal  infection, pansensitive and she was accordingly given Cipro 250 mg p.o.  b.i.d.  With this, she reports drainage and odor seem to have  diminished.   She has had no fever or systemic symptoms and is unaware of any  immediate problems with the wound itself.   PHYSICAL EXAMINATION:  Blood pressure 141/65, pulse 66, respirations 20,  temperature 97.6.   Random blood glucose is 122 mg/dL.   The ulcer remains bridged with 2 components measuring 3.5 x 1.0 and 1.5  x 1.5 cm respectively.  Both of these represent a reduction from her  last visit.  On closer inspection, it is apparent that the larger of  these 2 wounds is in fact totally epithelialized and the other has only  a small central area probably 0.8 x 0.6 cm that is not covered.   IMPRESSION:  Satisfactory course.   DISPOSITION:  The wound is covered with a Mepitel equivalent gauze and  absorptive pad and the patient is returned to a Profore wrap.  Followup  visit will be in 1 week and it is anticipated that she will likely be  healed by that time.           ______________________________  Jonelle Sports [name], M.D.     RES/MEDQ  D:  07/01/2008  T:  07/02/2008  Job:  [id number]

## 2011-01-24 NOTE — Assessment & Plan Note (Signed)
[facility]   [name], BHULLAR               ACCOUNT NO.:  [id number]   MEDICAL RECORD NO.:  [id number]      DATE OF BIRTH:  Feb 09, 1946   PHYSICIAN:  Theresia Majors. Tanda Rockers, M.D. VISIT DATE:  02/06/2008                                   OFFICE VISIT   SUBJECTIVE:  Mr. [name] is a 65 year old lady who we are following for  a left lateral malleolar stasis ulcer.  In the interim, we treated her  with compression wrap therapy and Silverlon.  She continues to be  ambulatory.  Her edema is reasonably well controlled.  There has been no  fever or excessive pain.   OBJECTIVE:  VITAL SIGNS:  Blood pressure is 120/54, respirations 18,  pulse rate 64, temperature 97.8, and capillary blood glucose is 138 mg  percent.  EXTREMITIES:  Inspection of the left lateral leg shows that the ulcer is  99% re-epithelialized.  There is persistence of 1 to 2+ edema.  The  pedal pulse is palpable.  Capillary refill is brisk.  There is no  evidence of ascending infection, cellulitis, or abscess.   ASSESSMENT:  Clinical improvement of the stasis ulcer.   PLAN:  We will continue the Silverlon ultra matting compression.  We  will reevaluate the patient in 1 week.      Harold A. Tanda Rockers, M.D.  Electronically Signed     HAN/MEDQ  D:  02/06/2008  T:  02/07/2008  Job:  [id number]

## 2011-01-24 NOTE — Assessment & Plan Note (Signed)
[facility]   NAMENAYELIZ, HIPP               ACCOUNT NO.:  [id number]   MEDICAL RECORD NO.:  [id number]      DATE OF BIRTH:  [birth year]   PHYSICIAN:  Jonelle Sports. Sevier, M.D.  VISIT DATE:  11/18/2008                                   OFFICE VISIT   HISTORY:  This 65 year old white female who has been followed for venous  stasis ulcerations, a very refractory wound on the left-hand side which  is finally healed after 2 applications of Apligraf and one that had  recently recurred on the right which is today healed as well.   Of some note, it is the fact that during the past week she has had an  episode of the orange-colored urine and extreme pruritus with fever and  chills and is being evaluated for gallbladder disorder.   Her exam today is completely benign showing complete healing and  accordingly she will be released from this clinic to follow up only on a  p.r.n. basis.  She will continue at home to wrap her legs with gauze and  Ace wraps as she has done for years, being unable to tolerate  compression stockings.           ______________________________  Jonelle Sports Cheryll Cockayne, M.D.     RES/MEDQ  D:  11/18/2008  T:  11/19/2008  Job:  [id number]

## 2011-01-24 NOTE — Group Therapy Note (Signed)
[name], [name]               ACCOUNT NO.:  [id number]   MEDICAL RECORD NO.:  [id number]          PATIENT TYPE:  REC   LOCATION:  FOOT                         FACILITY:  [facility]   PHYSICIAN:  Lenon Curt. Chilton Si, M.D.  DATE OF BIRTH:  08-20-1946                                 PROGRESS NOTE   HISTORY:  [name] is a 65 year old, insulin-controlled diabetic who is  significantly overweight and has had recurrent problems with venous  stasis ulcers, venous incompetency and inability to wear any kind of  compression stocking, per her history because of traumatic injuries to  the skin repeatedly in the past.  She states that her legs get really  swollen during the heat over the last week, and she broke down in new  ulcerations over the lateral left lower leg above the ankle.  There is a  small pinpoint lesion of the dorsum of the right foot, which was also  leaking a significant amount of fluid over the last several days, which  she says is now under better control.   PHYSICAL EXAMINATION:  VITAL SIGNS:  The patient is afebrile with a temperature of 98.4  degrees, pulse 74, respirations 20, blood pressure 132/63.  Capillary  glucose 146 mg%, the most recent hemoglobin A1c of 6.4 on April 12, 2008.  [name] is her primary care physician and follows her  diabetes.   Her wounds today were measured.  Her treatment will consist of  Silverlon, UltraMide and Profore wraps.  This should be re-evaluated in  two weeks by a physician, and the wrap should be changed in one week by  a nurse.      Lenon Curt Chilton Si, M.D.  Electronically Signed     AGG/MEDQ  D:  04/24/2008  T:  04/24/2008  Job:  [id number]   cc:   [name], M.D.  Fax: [phone]

## 2011-01-24 NOTE — Assessment & Plan Note (Signed)
[facility]   [name], DEMONT               ACCOUNT NO.:  [id number]   MEDICAL RECORD NO.:  [id number]      DATE OF BIRTH:  28-Nov-1945   PHYSICIAN:  Theresia Majors. Tanda Rockers, M.D. VISIT DATE:  05/21/2007                                   OFFICE VISIT   SUBJECTIVE:  [name] is a 65 year old lady who we are following for  left lateral stasis ulcer.  She has been treated with silver lined  swath, Ultra-Mide lotion and ABD pad on a Profore.  She returns for  follow-up.  In the interim she has had moderate drainage, no malodor and  no fever.  She continues to be ambulatory.   OBJECTIVE:  VITAL SIGNS:  Her vital signs stable.  She is afebrile.  WOUND:  Inspection of the wound shows that there is no excessive  necrosis.  The edema is reasonably controlled with the compression.  There is failure of re-epithelialization at this point.  The capillary  refill is brisk.  There is no evidence of ascending infection or  abscess.   ASSESSMENT:  Marginally improved stasis ulcer.   PLAN:  We will return the patient to compression wrap with silver lined  swath.  Re-evaluate her in 1 week.      Harold A. Tanda Rockers, M.D.  Electronically Signed     HAN/MEDQ  D:  05/23/2007  T:  05/24/2007  Job:  [id number]

## 2011-01-24 NOTE — Consult Note (Signed)
[name], [name]               ACCOUNT NO.:  [id number]   MEDICAL RECORD NO.:  [id number]          PATIENT TYPE:  REC   LOCATION:  FOOT                         FACILITY:  [facility]   PHYSICIAN:  Jonelle Sports. Sevier, M.D. DATE OF BIRTH:  [birth year]   DATE OF CONSULTATION:  07/02/2007  DATE OF DISCHARGE:                                 CONSULTATION   HISTORY:  This is a 65 year old white female well known to this clinic  with chronic venous problems in her left lower extremity and with  extensive scarring around the left ankle due to old injury and surgery  and also old venous ulcerations.  She has been in treatment again for  several weeks now for recurrent ulceration in this area.  Apparently  when last seen here, she had two wounds, one more anteriorly disposed in  the ankle crease on the left, and the other more lateral disposed.  She  reports that the more lateral wound seems to have healed upon her  removal from her dressings today.  She has had no undue pain, no  increased secretion, no fever or chills or other infectious symptoms,  and really feels as if she is doing satisfactorily.   PHYSICAL EXAMINATION:  VITAL SIGNS:  Blood pressure 126/66, pulse 68 and  regular, respirations 18, temperature 98.7.   The wound indeed now is a solitary wound on the anterior aspect of the  left ankle at the ankle crease measuring 2.0 x 2.0 x 0.1 cm.  There is  considerable crusting over a portion of this wound and in the area  adjacent to the wound, presumably the latter due in particular just to  drying of secretions from the wound itself.   IMPRESSION:  Continued progress of venous stasis ulceration left lower  extremity.   DISPOSITION:  The incrustation on the wound itself is gently sharply  removed without difficulty as is some of the incrustation from the areas  around the wound.  No new wounds are discovered, and the area seems to  be reasonably clean at this point.   The patient will  be treated again with an application of a Silverlon  pad, covered by an absorptive pad, and that extremity will be placed in  Una wrap.   She will be seen weekly for the next 2 weeks by the nurse and will be  seen by the physician again in 3 weeks.           ______________________________  Jonelle Sports Cheryll Cockayne, M.D.     RES/MEDQ  D:  07/02/2007  T:  07/03/2007  Job:  [id number]

## 2011-01-24 NOTE — Consult Note (Signed)
[name], [name]               ACCOUNT NO.:  [id number]   MEDICAL RECORD NO.:  [id number]            PATIENT TYPE:   LOCATION:                                 FACILITY:   PHYSICIAN:  Jonelle Sports. Sevier, M.D.      DATE OF BIRTH:   DATE OF CONSULTATION:  12/11/2007  DATE OF DISCHARGE:                                 CONSULTATION   HISTORY:  This 65 year old white female has been followed for an  extended period of time with a slowly-healing ulceration, stasis in  nature, on the anterolateral left ankle.  Several weeks ago this was  treated with an Apligraf application and initially healed and completely  covered, only to break down soon again thereafter.  When she was most  recently seen a week ago the wound was treated with an application of  Oasis.   The patient returns today reporting no complications, no increased  swelling, no odor, no drainage, no pain, no fever or systemic symptoms.   Blood pressure 128/79, pulse 72, respirations 20, temperature 97.4.   The ulcer itself looks quite clean and its base may be very, very  slightly hypergranular.  It now measures 2.4 x 2 x 0.1 cm and looks  clearly improved from a week ago.   IMPRESSION:  Improvement of chronic stasis ulceration, left lower  extremity.   DISPOSITION:  The wound is again treated with an application of Oasis  which is done in a traditional fashion, covered with Mepitel moist  dressing and then that extremity is placed again in a Profore wrap.   Follow-up visit will be here in 5 days in that she will be unavailable  to make a visit in 7 days.           ______________________________  Jonelle Sports Cheryll Cockayne, M.D.     RES/MEDQ  D:  12/11/2007  T:  12/11/2007  Job:  [id number]

## 2011-01-24 NOTE — Assessment & Plan Note (Signed)
[facility]   [name], [name]               ACCOUNT NO.:  [id number]   MEDICAL RECORD NO.:  [id number]      DATE OF BIRTH:  1946/04/20   PHYSICIAN:  Jonelle Sports. Sevier, M.D.  VISIT DATE:  07/22/2008                                   OFFICE VISIT   HISTORY:  This 65 year old white female with a fairly refractory stasis  ulcerations in the lateral malleolar area, it is now about 6 weeks  status post for second Apligraf.  She has not completely healed and  still has two small remaining areas that are problematic.  She was  treated most recently with Puracol and soft sore pad inside of a PRAFO  wrap.  She reports no particular problems since her last visit here a  week ago.   EXAMINATION:  Blood pressure 137/83, pulse 66, respirations 16, and  temperature 97.7.  Capillary blood glucose is 180 mg/dL.  She really has  only one clearly opened area now on the distal left lower extremity.  This is actually anterior and superior to the lateral malleolus.  It  measures 0.9 x 1.0 x 0.1 cm and appears to be somewhat hypertrophic.  A  more distal lesion now appears to have epithelialized, although it is  still very vulnerable and measures approximately 0.4 x 0.7 cm.   IMPRESSION:  Gradual continued improvement chronic stasis ulceration,  left lower extremity.   DISPOSITION:  1. Silver nitrate caustic stick is used to debride the hypertrophic      granulation from the more proximal of the wounds.  The wounds were      then be dressed again with Puracol application, soft sore pad to      the area, and a PRAFO wrap.  She complains of some itching      throughout that lower extremity with no signs of cutaneous reaction      and was simply be treated with application of a moisturizing cream      widely to that extremity beneath her wrap.  2. Followup visit will be in 1 week.           ______________________________  Jonelle Sports. Cheryll Cockayne, M.D.     RES/MEDQ  D:   07/22/2008  T:  07/22/2008  Job:  [id number]

## 2011-01-24 NOTE — Assessment & Plan Note (Signed)
[facility]   [name], [name]               ACCOUNT NO.:  [id number]   MEDICAL RECORD NO.:  [id number]      DATE OF BIRTH:  1946-02-27   PHYSICIAN:  Jonelle Sports. Sevier, M.D.       VISIT DATE:                                   OFFICE VISIT   HISTORY:  This 65 year old white female with a refractory stasis  ulceration in her lateral malleolar area at the site of old injury is  here today for Apligraf application.  She has been under traditional  stasis ulcer care for months on end without satisfactory progress and  hopefully this will get her moving so that we can finally close this  ulceration.   She reports a little more stinging in the area since she was last seen  here but otherwise has tolerated her compressive wrap and so forth  satisfactorily.   EXAMINATION:  Blood pressure 125/73, pulse 71, respirations 18,  temperature 97.4.  Random blood glucose by the patient on 90 mg/dL.  The wound on the left lateral malleolar area now measures 4.0 x 2.5 x  0.1 cm and has a clean granular base with minimal hypergranulation in  the lateral aspect of the wound.   There is also a small satellite ulcer at approximately 5 o'clock from  this wound measuring 1.5 x 1.2 x 0.05 cm.  This likewise has a  reasonably clean base.   IMPRESSION:  Satisfactory course but with failure to heal in an  otherwise satisfactory course regarding left lateral malleolar stasis  ulceration.   Ulcer now ready for Apligraf application.   PROCEDURE NOTE:  The the patient's left lateral malleolar ulcer is  sharply debrided by lite scraping away of a bit of hypergranulation at  the lateral margin of the base.  The same procedure is carried out on  the small satellite ulcer.   With the wound base now ready the Apligraf is prepared in the usual  fashion with fenestration and then applied to the patient's ulcer and  this application is extended over the small satellite ulcer as well.  The  margins are held in place by the use of Steri-Strips and the graft  is then covered with application of hydrogel and a nonstick dressing  pad.  This is covered with an absorptive pad and that extremity is  placed in a Profore wrap.   The the patient will return here in 1 week for change of the outer  dressing and re wrap of the Profore wrap by the nurse.  To be seen by a  physician only if needed.   Otherwise will return visit with this physician will be in 2 weeks.          ______________________________  Jonelle Sports Cheryll Cockayne, M.D.    RES/MEDQ  D:  10/02/2007  T:  10/02/2007  Job:  [id number]

## 2011-01-24 NOTE — Group Therapy Note (Signed)
NAMEDERRIAN, RODAK               ACCOUNT NO.:  [id number]   MEDICAL RECORD NO.:  [id number]          PATIENT TYPE:  REC   LOCATION:  FOOT                         FACILITY:  [facility]   PHYSICIAN:  Lenon Curt. Chilton Si, M.D.  DATE OF BIRTH:  November 14, 1945                                 PROGRESS NOTE   HISTORY:  A 65 year old female with chronic venous stasis ulcer of the left lower  leg.  She returns today for further evaluation.  The patient has been  dressed in Profore dressings.  They were removed.  Ulceration appears to  have beefy red material at the base of the wounds which have a somewhat  scooped-out appearance.  There is surrounding chronic tissue damage  related to previous ulcerations.   On the prior visit there was concern about a spreading erythema and the  patient was apparently put on some antibiotics by Maxwell Caul,  M.D.  She has finished her doxycycline and returns today.  The wounds  are somewhat tender, but no worse than before and perhaps a little  better.   PHYSICAL EXAMINATION:  VITAL SIGNS:  Temperature 98.2, pulse 67, respirations 20, blood  pressure 135/69.  The patient is diabetic.  Her last blood sugar was 117  and hemoglobin A1C was 6.4.  GENERAL:  Her wound inspection shows left lateral ankle wound to be 5.4  x 0.2 cm.  Left lateral ankle appears to be bridging across the wound  above it now given the greater length of both wounds.  There is mild  erythema.  The lines of erythema do not cross the previous ink markings  on the leg made by Dr. Leanord Hawking.   PLAN:  Finish antibiotics.  I have applied Oasis to the wound area using two  packages of Oasis, Steri-Strips, Mepilex, gauze, and reapplied Profore  compression dressings to the leg.   The patient advised to return June 01, 2008, to inspect Profore and  the wounds.   DIAGNOSIS:  1. 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  2. CPT code [phone].      Lenon Curt Chilton Si, M.D.  Electronically Signed     AGG/MEDQ  D:  05/29/2008  T:  05/30/2008  Job:  [id number]

## 2011-01-24 NOTE — Assessment & Plan Note (Signed)
[facility]   NAMEHIROMI, [name]               ACCOUNT NO.:  [id number]   MEDICAL RECORD NO.:  [id number]      DATE OF BIRTH:  07/15/1946   PHYSICIAN:  Theresia Majors. Tanda Rockers, M.D. VISIT DATE:  01/07/2008                                   OFFICE VISIT   SUBJECTIVE:  [name] is a 65 year old lady who we are treating for a  left lateral lower extremity stasis ulcer.  In the interim, we treated  her with a Silverlon swab and a Profore wrap.  We discontinued using the  OASIS protocol due to increased maceration and drainage.  In the  interim, culture was taken.  In the interim, she has kept the Profore  wrap and the Silverlon wrap in place.  There has been no excessive  drainage.  There has been decrease in pain and no fever.   OBJECTIVE:  Blood pressure is 146/73, respirations 20, pulse rate 73,  temperature 97.9.  She is accompanied by her husband.  Inspection of the  left lower extremity shows that there has been dramatic decrease in the  area of ulceration.  They are now separate areas separated by a bridge  of epithelium.  The wound is clean.  It appears to contain healthy  granulation with advance in epithelium.  The foot is well perfused.  The  edema is reasonably controlled and is manifested by linear wrinkles.  There is no evidence of ascending infection.   ASSESSMENT:  Clinical improvement with return to external compression  and a Silverlon swab.   PLAN:  We will resume Silverlon, Ultra Mide cream, and Profore.  The  patient will be seen weekly by the nurse and every 2 weeks by the  physician.      Harold A. Tanda Rockers, M.D.  Electronically Signed     HAN/MEDQ  D:  01/07/2008  T:  01/08/2008  Job:  [id number]

## 2011-01-24 NOTE — Assessment & Plan Note (Signed)
[facility]   NAMEVALORY, WETHERBY               ACCOUNT NO.:  [id number]   MEDICAL RECORD NO.:  [id number]      DATE OF BIRTH:  July 25, 1946   PHYSICIAN:  Maxwell Caul, M.D.      VISIT DATE:                                   OFFICE VISIT   Ms. Pinkhasov returns today for followup of a wound on her left lateral  lower leg.  We have been most recently applying Oasis largely over the  patient's objection (Oasis has never worked on me).  Indeed since last  time she was here, her drainage has picked up, the whole area felt very  irritated, and she returns today in followup.   On examination, her temperature is 98.5.  The area on the left lateral  ankle measures 5 x 4.7 x 0.2.  Unfortunately, the area has actually  worsened, probably secondary to inadequate drainage control.  We also  have asked for an Apligraf, which is not in yet.   IMPRESSION:  Largely stasis ulceration as described above.  This is not  doing well with the Oasis.  Her cellulitis aspect of that is largely  resolved and really no further antibiotics are necessary.  I have used  silver cell here to help with the drainage and to control any bile  burden.  Obviously, the Oasis was not helpful.  The last time we went  through this wound with her it took an Apligraf to start the healing  process and I will look forward to applying that hopefully next week.           ______________________________  Maxwell Caul, M.D.     MGR/MEDQ  D:  06/05/2008  T:  06/06/2008  Job:  [id number]

## 2011-01-24 NOTE — Assessment & Plan Note (Signed)
[facility]   NAMEANNAKATE, [name]               ACCOUNT NO.:  [id number]   MEDICAL RECORD NO.:  [id number]      DATE OF BIRTH:  September 20, 1945   PHYSICIAN:  Theresia Majors. Tanda Rockers, M.D. VISIT DATE:  08/22/2007                                   OFFICE VISIT   SUBJECTIVE:  [name] returns for follow-up of stasis ulcer involving  the lateral left leg.  In the interim she continues to be ambulatory.  There has been moderate drainage, but no fever or malodor.   OBJECTIVE:  Blood pressure is 138/81, respirations 18, pulse rate 81,  temperature is 97.5.  Inspection of the wound shows actual improvement  with decrease in volume.  There is healthy-appearing granulation with  advancing epithelium.  There is no drainage.  There is no malodor.  There is brisk capillary refill.  No evidence of ischemia or ascending  infection.   IMPRESSION:  Clinical response to compression of stasis ulcer.   PLAN:  We will return her to the Silverlon swath, the Ultramia cream and  the PROFORE we will reevaluate her in 1 week p.r.n.      Harold A. Tanda Rockers, M.D.  Electronically Signed     HAN/MEDQ  D:  08/22/2007  T:  08/22/2007  Job:  [id number]

## 2011-01-24 NOTE — Assessment & Plan Note (Signed)
[facility]   NAMENIEMA, CARRARA               ACCOUNT NO.:  [id number]   MEDICAL RECORD NO.:  [id number]      DATE OF BIRTH:  [birth year]   PHYSICIAN:  Jonelle Sports. Sevier, M.D.  VISIT DATE:  06/24/2008                                   OFFICE VISIT   HISTORY:  This is 65 year old white female with an extremely refractory  stasis ulceration of the left lateral malleolar area, received Apligraf  application 2 weeks ago.  She was seen a week ago and only the outer  dressing was removed as is indicated.   She notices some odor to the wound over the past week but no fever or  systemic symptoms.  No other direct change that she can appreciate.   EXAMINATION:  Blood pressure 139/74, pulse 69, respirations 12, and  temperature 97.5.  Capillary blood glucose 220 mg/dL.   The wound on the left ankle which is now divided into 2 wounds, measures  3.5 x 2.6 cm more laterally and 2.0 x 2.8 cm more proximally.  The wound  surface is shiny and appears well granulated with may be 1 or 2 possible  islands of epithelialization.  More importantly, it was noted that her  dressing was slightly green at the time it was removed and there is  clearly the odor of Pseudomonas in this wound.   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   DISPOSITION:  The patient will be placed on Cipro 500 mg p.o. b.i.d.   In keeping with the protocol for Apligraf that will simply be redressed  today with a contact layer covered with a small amount of hydrogel and  absorptive bandage, and she will be returned to Profore wrap.   Followup visit will be here in 1 week.           ______________________________  Jonelle Sports. Cheryll Cockayne, M.D.     RES/MEDQ  D:  06/24/2008  T:  06/25/2008  Job:  [id number]

## 2011-01-27 NOTE — Consult Note (Signed)
[name], [name]               ACCOUNT NO.:  [id number]   MEDICAL RECORD NO.:  [id number]            PATIENT TYPE:   LOCATION:                                 FACILITY:   PHYSICIAN:  Lebron Conners, M.D.        DATE OF BIRTH:   DATE OF CONSULTATION:  05/30/2006  DATE OF DISCHARGE:                                   CONSULTATION   HISTORY:  She is back because of new ulceration, medial aspect, right ankle.  It is very superficial and measures about 1.5 x 2 cm fully granulated at  this point.  Moderate edema.  Skin generally in good condition.   IMPRESSION:  This seems typical to what she has had in the past, and she has  done well with Unna boot in that case.   PLAN:  Unna boot is reapplied.  She is also given a new prescription for a  custom foot, as the hers are showing considerable signs of use.  Return in a  week.      Lebron Conners, M.D.  Electronically Signed     WB/MEDQ  D:  05/30/2006  T:  05/31/2006  Job:  [id number]

## 2011-01-27 NOTE — Assessment & Plan Note (Signed)
[facility]   NAMEBRYTANI, [name]               ACCOUNT NO.:  [id number]   MEDICAL RECORD NO.:  [id number]      DATE OF BIRTH:  Nov 30, 1945   PHYSICIAN:  Jonelle Sports. Sevier, M.D.  VISIT DATE:  02/07/2006                                     OFFICE VISIT   HISTORY:  This 65 year old white female is followed in a palliative way for  chronic venous ulceration on the lateral aspect of the left ankle.  She is  seen at approximately 2-week intervals and treated with debridement of the  wound, application of gentamycin gel, Silverlon, and Profore wrap.  In the  interim between visits, she removes the Profore and rewraps herself with  Cling and Ace bandage.   She is here now after a 2 to 3-week hiatus for reevaluation.   PHYSICAL EXAMINATION:  VITAL SIGNS: Blood pressure 132/76, heart rate 68 and  regular, respirations 14, temperature 98.3.   The wound on the lateral aspect of the left lower extremity now measures 2.8  x 2.3 x 0.1 cm with minimal to moderate serous drainage, no odor,  approximately 20% slough, no eschar, and with chronic eschar at wound  margins.   IMPRESSION:  Status quo.   DISPOSITION:  The wound was full-thickness debrided to get rid of the slough  at its base and some other crusty debris.   It has been treated with an application of gentamycin gel, covered with a  Silverlon pad.   Extremity in general is treated with application of Ultra Mide 25.   The extremity is then placed in a Profore wrap.   Followup visit will be in 2 weeks.           ______________________________  Jonelle Sports Cheryll Cockayne, M.D.     RES/MEDQ  D:  02/07/2006  T:  02/07/2006  Job:  [id number]

## 2011-01-27 NOTE — Assessment & Plan Note (Signed)
[facility]   [name], [name]               ACCOUNT NO.:  [id number]   MEDICAL RECORD NO.:  [id number]      DATE OF BIRTH:  Aug 22, 1946   PHYSICIAN:  Theresia Majors. Tanda Rockers, M.D.      VISIT DATE:                                     OFFICE VISIT   VITAL SIGNS:  Her vital signs are stable.  Blood pressure is 130/82.  Respirations are 20.  Pulse rate 76, and she is afebrile.   PURPOSE OF TODAY'S VISIT:  [name] returns for followup of a stasis  ulcer on the lateral malleolus of the left lower extremity.  During the  interim, she has worn multilayer compressive wrap with Silverlon, gentamicin  ointment and Ultra Mide cream.  She reports minimum drainage, and no malodor  but a persistence of edema.   WOUND EXAM:  Examination of the lower extremities shows there is a  persistence of edema, 2+, with prominent reticular varicosities.  The ulcer  itself is clean with scant drainage.  There has been contraction of the  granulating base, and there is evidence of epithelial advancement from the  periphery.  There is no malodor.  There is no evidence of digital ischemia.   WOUND SINCE LAST VISIT:   CHANGE IN INTERVAL MEDICAL HISTORY:   DIAGNOSIS:  Improved wound.   TREATMENT:  We are reapplying Ultra Mide, gentamicin and Silverlon with the  Profore.   ANESTHETIC USED:   TISSUE DEBRIDED:   LEVEL:   CHANGE IN MEDS:   COMPRESSION BANDAGE:   OTHER:   MANAGEMENT PLAN & GOAL:  We will reevaluate the wound in 2 weeks.          ______________________________  Theresia Majors Tanda Rockers, M.D.    Cephus Slater  D:  03/22/2006  T:  03/22/2006  Job:  [id number]

## 2011-01-27 NOTE — Assessment & Plan Note (Signed)
[facility]   NAMEBIRGIT, [name]               ACCOUNT NO.:  [id number]   MEDICAL RECORD NO.:  [id number]      DATE OF BIRTH:  06-24-1946   PHYSICIAN:  Theresia Majors. Tanda Rockers, M.D. VISIT DATE:  01/24/2006                                     OFFICE VISIT   [name] returns for follow-up with a stasis ulcer of her left lateral  malleolar area.  In the interim she has worn a compressive hose.  She  reports that there has been some decrease in the amount of swelling and in  the secretions.  She has had no change in her medication, no fever.   OBJECTIVE:  Her blood pressure is 128/66, respirations are 12, pulse rate is  76 and she is afebrile.  Capillary blood glucose is 112.  The previous wrap  is removed, disclosing a healthy granulating ulcer bed with 100% granulation  and evidence of re-epithelilialization advancing from the periphery.  There  was scant drainage.  There is a persistence of 2-3+ edema.   IMPRESSION:  Improved stasis ulcer.   RECOMMENDATIONS:  Ultramide 25 with topical gentamicin and __________ and a  Profore wrap.  We will see the patient in follow-up in one week.           ______________________________  Theresia Majors. Tanda Rockers, M.D.     [name]  D:  01/24/2006  T:  01/25/2006  Job:  [id number]

## 2011-01-27 NOTE — Assessment & Plan Note (Signed)
[facility]   [name], BIAS               ACCOUNT NO.:  [id number]   MEDICAL RECORD NO.:  [id number]      DATE OF BIRTH:  Aug 04, 1946   PHYSICIAN:  Theresia Majors. Tanda Rockers, M.D. VISIT DATE:  04/11/2006                                     OFFICE VISIT   CLINIC NOTE:  Ms. [name] returns for followup of stasis ulcer on the  lateral aspect of the left lower extremity.  During the interim, she has  worn a Le[employer] centre manager dressing.  She has  currently been  taking a tapering dose of steroids for an acute flareup of degenerative  joint disease.  She denies fever.  Her swelling is less.  She has lost 10  pounds most likely due to diuresis over the last week.  Her capillary blood  glucose was 26 mg/%.  She has eaten fast food within the last hour.  Vital  signs:  Blood pressure was 130/70, respirations 22, pulse rate of 80 and she  is afebrile.   Exam of the left lower extremity shows  the stasis ulcers associated with a  rim of erythema and desquamated necrotic tissue.  These areas were sharply  debrided with a 10-blade following the topical application of Lidocaine  ointment.  The resulting bleeding was easily controlled with direct  pressure.   ASSESSMENT:  Improved stasis ulcers, adequate edema control with compression  wrap.   PLAN:  We will replace the patient in the SelectSilver Ultramad and Profore  wrap.  We will re-evaluate her in 1 week.           ______________________________  Theresia Majors. Tanda Rockers, M.D.     Cephus Slater  D:  04/11/2006  T:  04/11/2006  Job:  [id number]

## 2011-01-27 NOTE — Assessment & Plan Note (Signed)
[facility]   NAMEMELODY, CIRRINCIONE               ACCOUNT NO.:  [id number]   MEDICAL RECORD NO.:  [id number]      DATE OF BIRTH:  09/23/1945   PHYSICIAN:  Theresia Majors. Tanda Rockers, M.D.      VISIT DATE:                                     OFFICE VISIT   SUBJECTIVE:  Ms. Greenlaw is a 65 year old with a stasis ulceration on the  lateral aspect of her left lower extremity.  She has been treated over  several years with various compression strategies.  Most recently, she was  treated with Ultramide and Profore wrap and a Select Silver directional  dressing.  She returns for followup.  In the interim, she denies pain, fever  or excessive drainage.   OBJECTIVE:  Blood pressure is 120/70, respirations 18, pulse rate 84 and she  is afebrile.  Capillary blood glucose is 152 mg percent.  Inspection of the  wound shows that there has been adequate compression as manifested by the  lineal wrinkles in her skin.  The previous ulcer has a healthy eschar with  no drainage.  Capillary refill is brisk, pedal pulses are readily palpable.   ASSESSMENT:  Improved stasis ulcer.   PLAN:  We will return the patient to a Profore wrap and reevaluate her in  ten days.           ______________________________  Theresia Majors Tanda Rockers, M.D.     Cephus Slater  D:  07/03/2006  T:  07/03/2006  Job:  [id number]

## 2011-01-27 NOTE — Assessment & Plan Note (Signed)
[facility]   [name], [name]               ACCOUNT NO.:  [id number]   MEDICAL RECORD NO.:  [id number]      DATE OF BIRTH:  Dec 12, 1945   PHYSICIAN:  Maxwell Caul, M.D. VISIT DATE:  05/30/2006                                     OFFICE VISIT   HISTORY OF PRESENT ILLNESS:  [name] was  doing well when I last saw her  at the end of August. Her wound had essentially healed. She does not wear  greater pressure stockings. Her husband wraps her legs every second day. I  thought we declared this resolved. Unfortunately, apparently this  deteriorated and she was seen again on May 30, 2006 with a repeat  ulceration in the left lower extremity laterally. This currently measures  1.8 x 1.9 x 0.1. There is good granulation, however there was slough  present.   Debridement full thickness to the wound to remove superficial slough.  Hemostasis with direct pressure.   Wound since last visit is stable to improved.   MANAGEMENT PLAN AND FOLLOW UP:  We applied select silver ultramide and a  Profore wrap. The wound is 100% granulated. However there is no  epithelization. Once again this will probably be a protracted thing for Ms.  [name]. We will see her again in a week.           ______________________________  Maxwell Caul, M.D.     MGR/MEDQ  D:  06/08/2006  T:  06/10/2006  Job:  [id number]

## 2011-01-27 NOTE — Assessment & Plan Note (Signed)
[facility]   NAMEARDITH, TEST               ACCOUNT NO.:  [id number]   MEDICAL RECORD NO.:  [id number]      DATE OF BIRTH:  1946-04-29   PHYSICIAN:  Theresia Majors. Tanda Rockers, M.D. VISIT DATE:  04/05/2006                                     OFFICE VISIT   CLINIC NOTE   DATE OF VISIT:  April 05, 2006   SUBJECTIVE:  Ms. Millis returns for followup of a left lateral lower  extremity stasis ulcer.  During the interim, she has had progressive malaise  with joint pain and no fever.  She has not had significant drainage.  She  has been seen by her primary care physician who is recommending a short  course or corticosteroids.  She reports that there has been saturation of  the dressing but there has been no malodor.   OBJECTIVE:  VITAL SIGNS:  Her blood pressure is 120/70, her capillary blood  glucose is 99 mg%.  Respirations are 24.  Pulse rate 18 and she is afebrile.  MUSCULOSKELETAL:  Inspection of the left lower extremity shows that there is  3+ edema.  The ulcer has a 100% granulating base with a moist clear exudate.  There is mild to moderate erythema, absolutely no malodor and no marked  tenderness.   ASSESSMENT:  Persistent stasis ulcer.   PLAN:  We will change her dressing to include the Select Silver product to  wick away the exudate.  We will also apply multilayer wrap and Ultramide.  We will re-evaluate the patient in one week.           ______________________________  Theresia Majors. Tanda Rockers, M.D.     Cephus Slater  D:  04/05/2006  T:  04/05/2006  Job:  [id number]

## 2011-01-27 NOTE — Assessment & Plan Note (Signed)
[facility]   NAMEARIDAY, BRINKER               ACCOUNT NO.:  [id number]   MEDICAL RECORD NO.:  [id number]            DATE OF BIRTH:   PHYSICIAN:  Maxwell Caul, M.D. VISIT DATE:  05/11/2006                                     OFFICE VISIT   HISTORY/PURPOSE OF THIS EXAM:  Ms. Feltner returns for followup of her left  lower extremity venous stasis ulcer.  She was last seen here a week ago.  This was debrided and the wound was well on the way to epithelization.  She  had Profore wrap along with sliver select.   WOUND EXAM:  The left lower extremity ulcer has completely reepithealized.  I was surprised about how well this has done.  She still has the 1+ edema  and chronic changes of stasis.   Wound change since last visit resolved.   DIAGNOSIS:  Chronic venous stasis wound currently resolved.   TREATMENT:  The patient's husband wraps her other leg with a graded pressure  wrap.  They have tried graded pressure stockings in the past, and found it,  for one reason or another this does not have good effects for her.  Therefore, we have turned her back over to her husband in a graded pressure  wrap.  She asked for a prescription of Ultramide 25 and we have provided  this.   The patient is discharged at this point.           ______________________________  Maxwell Caul, M.D.     MGR/MEDQ  D:  05/11/2006  T:  05/12/2006  Job:  [id number]

## 2011-01-27 NOTE — Assessment & Plan Note (Signed)
[facility]   [name], [name]               ACCOUNT NO.:  [id number]   MEDICAL RECORD NO.:  [id number]      DATE OF BIRTH:  [birth year]   PHYSICIAN:  Theresia Majors. Tanda Rockers, M.D. VISIT DATE:  02/14/2006                                     OFFICE VISIT   [name] returns for follow-up of a stasis ulcer on the lateral aspect of  her left lower extremity.  In the interim, the patient has continued to wear  a compressive wrap.  She denies fever or excessive drainage or malodor.   OBJECTIVE:  Blood pressure is 114/68, pulse rate is 72, respirations 20, she  is afebrile.  Inspection of the legs shows that there is a retention of 2 to  3+ edema.  The ulcer itself has an area of maceration and exudate that was  full thickness debrided down to healthy bleeding tissue.  Hemorrhage was  controlled with direct pressure.   ASSESSMENT:  Slight improvement.   PLAN:  We will continue the previous regimen of topical gentamicin,  Silverlon and a Profore wrap.  We will see the patient in two weeks for  reevaluation.           ______________________________  Theresia Majors Tanda Rockers, M.D.     Cephus Slater  D:  02/14/2006  T:  02/15/2006  Job:  [id number]

## 2011-01-27 NOTE — Assessment & Plan Note (Signed)
[facility]   [name], [name]               ACCOUNT NO.:  [id number]   MEDICAL RECORD NO.:  [id number]      DATE OF BIRTH:  [birth year]   PHYSICIAN:  Lebron Conners, M.D.         VISIT DATE:                                     OFFICE VISIT   REASON FOR VISIT:  Followup of stasis ulcer, left lower extremity.   HISTORY:  The patient has been comfortable in her Profore wrap.  She took it  off a couple of days ago and has been treating the skin with lotion and  taking showers.  Reports that this has done a great deal for her sense of  well being.   EXAM:  There is good control of edema.  The skin is in good condition except  for the ulceration, lateral aspect of the left leg, which has decreased by  approximately half in both dimensions of measurement.   IMPRESSION:  Good progress.   PLAN:  Continuation of compressive wraps and return in two weeks.      Lebron Conners, M.D.  Electronically Signed     WB/MEDQ  D:  02/28/2006  T:  02/28/2006  Job:  [id number]

## 2011-01-27 NOTE — Assessment & Plan Note (Signed)
[facility]   NAMESTEFANNIE, [name]               ACCOUNT NO.:  [id number]   MEDICAL RECORD NO.:  [id number]           DATE OF BIRTH:   PHYSICIAN:  Jonelle Sports. Sevier, M.D.  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  She has been known to this clinic for years, and has healed on  occasions, but always breaks down again, and, accordingly, has been placed  in a palliative status.  She tends to manipulate her own dressings and seems  not highly motivated to total healing.  Hence, this status seems appropriate  for her.   She reports that since her last visit 8 days ago that she simply could not  tolerate the presence of the wrap any longer and took it off last night.  Yesterday would have been 7 days, and we have given her an appointment for 9  days instead of her usual 7, so this would appear to be her way of  manipulating herself back into more frequent visits.  Whatever, she has had  no additional pain in the wound itself.  She has noted no increase in  drainage.  Has had no fever or systems symptoms, and nothing else is new of  changed.  She has had no change in medications.   PHYSICAL EXAMINATION:  On examination today, the ulcer is very superficial,  measuring 2.5 x 1.9 cm in dimension, and approximately 0.025 cm in depth.  There is minimal serous drainage.  No slough, no black eschar.  There is  some hemorrhage change around the margins of the ulcer, but this is innocent  in appearance.  There appears to be greater than 50% epithelialization  compared to when this wound has been at its worst.   IMPRESSION:  Chronic venous insufficiency with edema and venous hypertension  of the left lower extremity with chronic ulceration at the  anterolateral  ankle crease.   DISPOSITION:  Application of gentamicin cream to the area (recently grew a  non-methicillin-resistant Staphylococcus.  __________ pad, SofSorb pad,  Profore wrap to the extremity to control the edema.  UltraMide 25 was used  on the remainder of the leg to get rid of some hyperkeratosis and also to  make her Profore stay in place better, which has been proved true for her in  the past.   FOLLOW UP:  A follow up visit is given for 1 week.   Incidentally, vital signs today revealed a blood pressure of 138/82, pulse  80 and regular, respirations 18, temperature 98.3.           ______________________________  Jonelle Sports. Cheryll Cockayne, M.D.     RES/MEDQ  D:  01/17/2006  T:  01/18/2006  Job:  [id number]

## 2011-01-27 NOTE — Assessment & Plan Note (Signed)
[facility]   NAMEDENAE, ZULUETA               ACCOUNT NO.:  [id number]   MEDICAL RECORD NO.:  [id number]            DATE OF BIRTH:   PHYSICIAN:  Maxwell Caul, M.D. VISIT DATE:  06/15/2006                                     OFFICE VISIT   VITAL SIGNS:  Her vitals are stable.  Temperature is 96.7.  Glucose 142.   PURPOSE OF TODAY'S VISIT:  Mrs. Barthel returns for her new ulceration on  the lateral aspect of her left ankle.  This measures 2 x 2 x 2 x 0.  I  mechanically debrided this last week.  We put Select Silver dressing on  this.  She returns today in followup.   WOUND EXAM:  The wound itself has measurements of 2.2 x 2 x 0.  It has 75-  100% good granulation.  I see the beginnings of epithelialization here.   WOUND SINCE LAST VISIT:   CHANGE IN INTERVAL MEDICAL HISTORY:   DIAGNOSIS:  Predominantly venous stasis wound to the left lateral ankle.   TREATMENT:  I will change her to a Promogran dressing and continue the  Profore wraps.  The wound looks improved.   ANESTHETIC USED:   TISSUE DEBRIDED:   LEVEL:   CHANGE IN MEDS:   COMPRESSION BANDAGE:   OTHER:   MANAGEMENT PLAN & GOAL:           ______________________________  Maxwell Caul, M.D.     MGR/MEDQ  D:  06/15/2006  T:  06/15/2006  Job:  [id number]

## 2011-01-27 NOTE — Consult Note (Signed)
NAMEMARYLEN, [name]               ACCOUNT NO.:  [id number]   MEDICAL RECORD NO.:  [id number]          PATIENT TYPE:  REC   LOCATION:  FOOT                         FACILITY:  [facility]   PHYSICIAN:  Jonelle Sports. Sevier, M.D. DATE OF BIRTH:  1946-02-28   DATE OF CONSULTATION:  06/26/2006  DATE OF DISCHARGE:                                   CONSULTATION   HISTORY:  This 65 year old white female with longstanding chronic venous  insufficiency with ulceration has been followed for months in sort of a  palliative fashion in that it has been never been possible to get her fully  healed and maintained that way, there being some suspicion of the need for  secondary gain with the persistence of a wound.   Whenever she has recently shown some progress in the venous wound on the  lateral malleolar area of her left lower extremity, this apparently in the  face of the use of Select Silver dressing.   She returns today having been in a Pro-for wrap for approximately 11 days,  having missed her scheduled visit.  She reports some itching but no other  change in symptomatology.  No increased drainage.  No pain.  No fever or  systemic symptoms.   EXAMINATION:  Blood pressure 130/67, pulse 67, respirations 16, temperature  98.1.   The wound on the lateral malleolar area of the left lower extremity measures  1.6 x 1.6 cm x 0.05 cm.  There is some epithelialization of the margins  minimal serous drainage and some dry encrustation of the surrounding skin  but no evidence of spreading cellulitis or inflammation.   IMPRESSION:  Satisfactory and stable course chronic venous wound left lower  extremity.   DISPOSITION:  1. The areas partial-thickness debrided with removal of several of the      areas of encrustation and dry blood adjacent to the wound.  This leaves      the periwound area looking much better.  The wound itself for requires      no direct attention.   Is treated with an application of Select  Silver, covered wound absorptive  pad and that extremity is again placed in Pro-for 4.  The skin of the  extremity in general is prepared with an application of Ultramide  have  before wrapping.   The patient will be seen in follow-up in 1 week.           ______________________________  Jonelle Sports. Cheryll Cockayne, M.D.     RES/MEDQ  D:  06/26/2006  T:  06/27/2006  Job:  [id number]

## 2011-01-27 NOTE — Assessment & Plan Note (Signed)
[facility]   [name], [name]               ACCOUNT NO.:  [id number]   MEDICAL RECORD NO.:  [id number]      DATE OF BIRTH:  May 07, 1946   PHYSICIAN:  Maxwell Caul, M.D.      VISIT DATE:                                     OFFICE VISIT   VITAL SIGNS:  Temperature is 97.5, pulse 70, respirations 22, blood pressure  130/70.   PURPOSE OF TODAY'S VISIT:  [name] returns for followup of a left lower  extremity lateral leg stasis ulcer.  This has been present chronically.  Dating back many years, she has had this problem and for a while had it  healed; however, recently it is exacerbated again.  She was seen here on  August 15.  She had a Profore wrap along with SelectSilver dressing.  She  returns today in followup.   WOUND EXAM:  The left lower extremity wound has improved.  There is  attempted epithelialization.  There was necrotic tissue along roughly 50% of  the outer circumference of this wound, which was full-thickness debrided.  The wound itself continues to look improved with good granulation and as  mentioned, an attempt at epithelialization.  She still has 1+ edema and  chronic changes of stasis.   Wound change since last visit improved.   DIAGNOSIS:  Chronic venous stasis wound.   TREATMENT:  Tissue debrided.  Full-thickness debridement along roughly 50%  of the circumference of this wound.   MANAGEMENT PLAN/GOAL:  We are returning to a Profore dressing with  SelectSilver and UltraMide.  We will re-review her in a week.  This wound  looks like it is on its way to resolution.      Maxwell Caul, M.D.  Electronically Signed     MGR/MEDQ  D:  05/04/2006  T:  05/04/2006  Job:  [id number]

## 2011-01-27 NOTE — Assessment & Plan Note (Signed)
[facility]   NAMESHAMYIA, GRANDPRE               ACCOUNT NO.:  [id number]   MEDICAL RECORD NO.:  [id number]      DATE OF BIRTH:  1946/06/25   PHYSICIAN:  Maxwell Caul, M.D. VISIT DATE:  05/11/2006                                     OFFICE VISIT   VITAL SIGNS:   HISTORY:  Ms. Rada returns today in followup from her chronic venous  stasis wound.  When we saw her last week, we debrided this wound which has  already epithelialized 50%.  We put Silver Select on this and graded  pressure wrap.   PURPOSE OF TODAY'S VISIT:   WOUND EXAM:  There is almost 100% resolution of the wound with complete  epithelialization.   WOUND SINCE LAST VISIT:  Improved.   CHANGE IN INTERVAL MEDICAL HISTORY:   DIAGNOSIS:   TREATMENT:   ANESTHETIC USED:   TISSUE DEBRIDED:   LEVEL:   CHANGE IN MEDS:   COMPRESSION BANDAGE:   OTHER:  The patient does not wear graded pressure stockings having tried  them in the past.  Her husband wraps her legs every second day and I will  turn that care back over to him.   MANAGEMENT PLAN & GOAL:  The patient has done well here and will return  p.r.n.           ______________________________  Maxwell Caul, M.D.    MGR/MEDQ  D:  05/11/2006  T:  05/12/2006  Job:  [id number]

## 2011-01-27 NOTE — Consult Note (Signed)
[facility]  Patient:    [name], [name]                        MRN: [id number] Proc. Date: 01/18/00 Attending:  Jonelle Sports. Cheryll Cockayne, M.D. CC:         Elana Alm. Eliezer Lofts., M.D.                          Consultation Report  REASON FOR CONSULTATION:  This 65 year old white female is seen at the courtesy of Dr. Elana Alm. Eliezer Lofts. for assistance with the management of a longstanding stasis wound of the left lower extremity.  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  She had become progressively more obese in adulthood and this has worsened, and she rapidly developed worsening of her varicosities and stasis dermatitis changes.  In 1984, she first developed an ulceration on the anterior surface of the left lower extremity distally, which has been present as an open ulceration since that time, despite several interventions.  These interventions have included Una boots on occasion, various other topical treatments, and even a stent at the wound care center in [facility].  Apparently very little was done there, because she could not afford the cost of the recommended therapies.  The problem has also been rendered somewhat more difficult by the fact that she claims to be allergic to "latex" and to all compression hose that have ever been tried.  For the past couple of years the patient has simply been treating the lesion herself by washing with Dial soap daily, applying boric acid ointment, and a dry dressing.  As stated, she now presents for our evaluation and advice.  PAST MEDICAL HISTORY: 1. Notable for type 2 diabetes mellitus of six years duration which apparently    is in satisfactory control with a recent A1C of 6.3%. 2. She also has glaucoma and cataracts.  ALLERGIES:  No known drug allergies other than the LATEX.  CURRENT MEDICATIONS: 1.  Actos. 2. Glucotrol XL. 3. Glucophage. 4. Lasix. 5. Hydrochlorothiazide. 6. Prozac. 7. Multiple eye drops. 8. She also takes some oral vitamins and Tylenol.  PHYSICAL EXAMINATION:  The examination today is limited to the distal lower extremities.  Her legs are grossly edematous bilaterally with rope-like varicosities and with chronic stasis changes on the distal pretibial area on the right with some degree of discoloration, but no ulceration.  Throughout the entirety of the pretibial area on the left, with ulcerations which will be later described in the distal aspects of this area of stasis change.  All pulses are palpable throughout her feet.  Monofilament testing shows that she has protected sensation throughout.  She has no pedal ulcerations of any kind.  Her foot temperatures are normal and essentially symmetrical.  Over the distal lower pretibial  area on the left is an irregular ulcer which is traced for future comparison, which measures approximately 139.0 mm in its maximum transverse dimension, and 74.0 mm in its maximum longitudinal dimension.  It has a yellowish exudative-type base.  There is on malodor.  IMPRESSION: 1. Severe stasis dermatitis of both lower extremities with chronic ulceration    of the left lower extremity. 2. Diabetes mellitus type 2.  DISPOSITION: 1. The patient is given some general instruction regarding foot and wound care,  with which she expressed some prior familiarity. 2. A plan is made to clean the ulcer with saline today and subsequently    with pulse lavage.  It will then be treated with Iodosorb ointment and    overlaid with absorptive pads such as perhaps ABDs, and then the entire    distal left lower extremity will be wrapped in a Profore dressing. 3. The dressing will be changed in three days, at which point she will be    subjected to pulse lavage of the wound.  Depending upon her degree of    drainage, the ability with which we  are able to get the edema out of    that extremity, and her overall progress, she will be placed repeatedly    in Profore boots until adequate healing has been achieved or other    disposition has been decided upon.  FOLLOWUP:  A follow-up visit will be to this clinic in one week. DD:  01/18/00 TD:  01/18/00 Job: [id number]

## 2011-01-27 NOTE — Assessment & Plan Note (Signed)
[facility]   NAME:  [name], [name]               ACCOUNT NO.:  [id number]   MEDICAL RECORD NO.:  [id number]            DATE OF BIRTH:   PHYSICIAN:  Maxwell Caul, M.D. VISIT DATE:  07/13/2006                                     OFFICE VISIT   PURPOSE OF TODAY'S VISIT:  [name] returned today, a followup on her  venous stasis ulcer over the left lateral malleolus.  We have been treating  this with Profore wraps.  She has done well.  The wound is completely  resolved at this point.  She has not tolerated graded pressure stockings in  the past.  Her husband wraps her right leg and I have advised her to go  ahead and allow him to do this on the left.  I have also cautioned that she  needs edema control around her ankle, especially on the lateral aspect or  she will have recurrent wounds.   Other than that, the area in question has resolved.  She will go back to her  usual care, which is her husband guided lower extremity wraps.           ______________________________  Maxwell Caul, M.D.     MGR/MEDQ  D:  07/13/2006  T:  07/14/2006  Job:  [id number]

## 2011-01-27 NOTE — Assessment & Plan Note (Signed)
[facility]   [name], [name]               ACCOUNT NO.:  [id number]   MEDICAL RECORD NO.:  [id number]      DATE OF BIRTH:  08-Sep-1946   PHYSICIAN:  Theresia Majors. Tanda Rockers, M.D. VISIT DATE:  04/25/2006                                     OFFICE VISIT   SUBJECTIVE:  [name] returns for follow-up of a left lower extremity  lateral leg stasis ulcer.  During the interim she has completed a full  course of tapered steroids for an exacerbation of arthritis.  She has also  had a diuresis at the instruction of her medical physician.  She denies  fever, excessive drainage or malodor.   VITAL SIGNS:  Blood pressure is 138/84, respiratory rate 20, pulse rate 76  and she is afebrile.  Her capillary blood glucose was 238 mg% this morning.   PURPOSE OF TODAY'S VISIT:   WOUND EXAM:  Inspection of the lower extremity shows that there is quite  impressively less edema but there is still 2+ edema.  The chronic changes of  stasis are persistent.  The wound itself appears to be improved.  There is a  100% granulating bed.  The wound has been photographed and entered into the  Wound [facility].  Capillary refill remains good.  Sensation remains intact.   WOUND SINCE LAST VISIT:   CHANGE IN INTERVAL MEDICAL HISTORY:   DIAGNOSIS:   TREATMENT:   ANESTHETIC USED:   TISSUE DEBRIDED:   LEVEL:   CHANGE IN MEDS:   COMPRESSION BANDAGE:   OTHER:   ASSESSMENT:  Improved wound.   MANAGEMENT PLAN & GOAL:  We are returning the patient to a Profore light  dressing with a Select silver unidirectional wicking dressing and Ultramide.  We will re-evaluate her in 10 days.           ______________________________  Theresia Majors Tanda Rockers, M.D.     [name]  D:  04/25/2006  T:  04/25/2006  Job:  [id number]

## 2011-02-20 ENCOUNTER — Encounter (HOSPITAL_BASED_OUTPATIENT_CLINIC_OR_DEPARTMENT_OTHER): Payer: Medicare Other | Attending: Internal Medicine

## 2011-02-20 DIAGNOSIS — E119 Type 2 diabetes mellitus without complications: Secondary | ICD-10-CM | POA: Insufficient documentation

## 2011-02-20 DIAGNOSIS — Z79899 Other long term (current) drug therapy: Secondary | ICD-10-CM | POA: Insufficient documentation

## 2011-02-20 DIAGNOSIS — Z794 Long term (current) use of insulin: Secondary | ICD-10-CM | POA: Insufficient documentation

## 2011-02-20 DIAGNOSIS — L97809 Non-pressure chronic ulcer of other part of unspecified lower leg with unspecified severity: Secondary | ICD-10-CM | POA: Insufficient documentation

## 2011-02-20 DIAGNOSIS — I872 Venous insufficiency (chronic) (peripheral): Secondary | ICD-10-CM | POA: Insufficient documentation

## 2011-02-27 ENCOUNTER — Encounter: Payer: Medicare Other | Admitting: Vascular Surgery

## 2011-03-13 ENCOUNTER — Encounter (HOSPITAL_BASED_OUTPATIENT_CLINIC_OR_DEPARTMENT_OTHER): Payer: Medicare Other | Attending: Internal Medicine

## 2011-03-13 DIAGNOSIS — I872 Venous insufficiency (chronic) (peripheral): Secondary | ICD-10-CM | POA: Insufficient documentation

## 2011-03-13 DIAGNOSIS — Z79899 Other long term (current) drug therapy: Secondary | ICD-10-CM | POA: Insufficient documentation

## 2011-03-13 DIAGNOSIS — E119 Type 2 diabetes mellitus without complications: Secondary | ICD-10-CM | POA: Insufficient documentation

## 2011-03-13 DIAGNOSIS — Z794 Long term (current) use of insulin: Secondary | ICD-10-CM | POA: Insufficient documentation

## 2011-03-13 DIAGNOSIS — L97809 Non-pressure chronic ulcer of other part of unspecified lower leg with unspecified severity: Secondary | ICD-10-CM | POA: Insufficient documentation

## 2011-03-28 ENCOUNTER — Encounter (INDEPENDENT_AMBULATORY_CARE_PROVIDER_SITE_OTHER): Payer: Medicare Other

## 2011-03-28 ENCOUNTER — Encounter (INDEPENDENT_AMBULATORY_CARE_PROVIDER_SITE_OTHER): Payer: Medicare Other | Admitting: Vascular Surgery

## 2011-03-28 DIAGNOSIS — I83893 Varicose veins of bilateral lower extremities with other complications: Secondary | ICD-10-CM

## 2011-03-29 NOTE — Consult Note (Signed)
NEW PATIENT CONSULTATION  [name], [name] DOB:  1946/01/06                                       03/28/2011 ZOXWR#:[id number]  This patient is a 65 year old patient referred by Dr. Cheryll [name] for the recurrent venous stasis ulcer in the left leg and severe venous insufficiency in the right leg.  This patient has had multiple venous stasis ulcers over the last 10 years, treated in the [facility].  She has never had treatment for her venous insufficiency.  She has developed significant darkening of the skin in both legs with bulging varicosities which cause aching, throbbing, and burning discomfort.  She tried to wear long-leg elastic compression stockings but because of her obesity there are no stockings that will fit and stay in place.  She has been having compression dressings changed by the [facility] Center and Dr. Cheryll [name] for several months with slow healing of the most recent ulcer on the lateral aspect of the left leg.  She also had an episode of bleeding in the right leg many years ago. She has no history of DVT or thrombophlebitis but does have severe swelling as the day progresses.  CHRONIC MEDICAL PROBLEMS: 1. Diabetes mellitus type 1. 2. Neuropathy. 3. History of rheumatic fever in 1973, no cardiac ramifications. 4. Degenerative joint disease, bilateral knee pain. 5. Negative coronary artery disease, hypertension, hyperlipidemia or     stroke.  SOCIAL HISTORY:  She is married, has 3 children.  Does not use tobacco or alcohol.  FAMILY HISTORY:  Positive coronary artery disease in her father, diabetes in a paternal grandmother.  Negative for stroke.  REVIEW OF SYSTEMS:  Positive for osteoarthritis, joint pain, morbid obesity.  Denies any chest pain, dyspnea on exertion, PND, orthopnea. Does have anemia.  All other systems are negative in complete review of systems.  PHYSICAL EXAMINATION:  Blood pressure 136/63, heart rate 64, respirations 24.  General:   She is an obese female who is in no apparent distress, alert and  oriented x3.  HEENT:  Normal for age.  EOMs intact. Lungs:  Clear to auscultation.  No rhonchi or wheezing.  Cardiovascular: Regular rhythm.  No murmurs.  Carotid pulses 3+.  No audible bruits. Abdomen:  Soft and obese.  No palpable masses.  Musculoskeletal:  Free of major deformities.  Neurologic:  Decreased sensation in the feet. Skin:  Exam reveals severe lipodermatosclerosis bilaterally, left worse than right,  with 2 active ulcers in the left lower leg anteriorly and laterally, each measuring about 2 x 1 cm.  There are bulging varicosities in both legs beginning in the mid to distal thigh extending into the medial calf.  She has 1+ to 2+ edema bilaterally.  Severe hyperpigmentation on the right leg medially and laterally as well as well as multiple reticular veins.  She has 3+ dorsalis pedis pulses bilaterally.  Today I ordered venous duplex exam bilaterally, which I have reviewed and interpreted.  She has the following findings: 1. No DVT. 2. Gross reflux in both great saphenous veins from the knee to the     saphenofemoral junction. 3. Gross reflux in the left small saphenous vein from mid calf to the     saphenopopliteal junction.  This patient has severe problems with venous insufficiency and multiple complications such as recurrent ulceration and bleeding and needs the following procedures: 1. Laser ablation of the left  great saphenous vein. 2. Laser ablation of the left small saphenous vein. 3. Laser ablation of the right great saphenous vein. 4. Return after 3 months to reassess bulging varicosities.  We will proceed with precertification to perform this in the near future because of her severe complications and skin changes.    [name], M.D. Electronically Signed  JDL/MEDQ  D:  03/28/2011  T:  03/29/2011  Job:  [id number]  cc:   [name]. [name], M.D.

## 2011-04-04 NOTE — Procedures (Unsigned)
LOWER EXTREMITY VENOUS REFLUX EXAM  INDICATION:  Venous hypertension with ulcer of the left lower extremity.  EXAM:  Using color-flow imaging and pulse Doppler spectral analysis, the bilateral common femoral, superficial femoral, popliteal, posterior tibial, greater and lesser saphenous veins are evaluated.  There is evidence suggesting deep venous reflux of >500 milliseconds noted in the bilateral common femoral veins.  The bilateral saphenofemoral junctions and nontortuous greater saphenous veins demonstrate reflux >500 milliseconds.  The right proximal short saphenous vein demonstrates competency.  The left proximal small saphenous vein demonstrates reflux >500 milliseconds with diameter measurements ranging from 0.42 to 0.55 cm.  GSV Diameter (used if found to be incompetent only)                                           Right    Left Proximal Greater Saphenous Vein           0.71 cm  0.70 cm Proximal-to-mid-thigh                     cm       cm Mid thigh                                 0.67 cm  0.65 cm Mid-distal thigh                          cm       cm Distal thigh                              0.60 cm  0.54 cm Knee                                      0.48 cm  0.47 cm  IMPRESSION: 1. The bilateral great saphenous veins, bilateral common femoral     veins, and left short saphenous vein demonstrate incompetency, as     described above. 2. Cystic structure with no internal flow noted in the medial     popliteal fossa on the right.           ___________________________________________ Quita Skye. Hart Rochester, M.D.  CH/MEDQ  D:  03/29/2011  T:  03/30/2011  Job:  [id number]

## 2011-04-17 ENCOUNTER — Encounter (HOSPITAL_BASED_OUTPATIENT_CLINIC_OR_DEPARTMENT_OTHER): Payer: Medicare Other | Attending: Internal Medicine

## 2011-04-17 DIAGNOSIS — B369 Superficial mycosis, unspecified: Secondary | ICD-10-CM | POA: Insufficient documentation

## 2011-04-17 DIAGNOSIS — L97809 Non-pressure chronic ulcer of other part of unspecified lower leg with unspecified severity: Secondary | ICD-10-CM | POA: Insufficient documentation

## 2011-04-20 ENCOUNTER — Encounter: Payer: Self-pay | Admitting: Vascular Surgery

## 2011-04-27 ENCOUNTER — Encounter: Payer: Self-pay | Admitting: Vascular Surgery

## 2011-05-11 ENCOUNTER — Encounter: Payer: Self-pay | Admitting: Vascular Surgery

## 2011-05-18 ENCOUNTER — Encounter (HOSPITAL_BASED_OUTPATIENT_CLINIC_OR_DEPARTMENT_OTHER): Payer: Medicare Other | Attending: Internal Medicine

## 2011-05-18 DIAGNOSIS — L97809 Non-pressure chronic ulcer of other part of unspecified lower leg with unspecified severity: Secondary | ICD-10-CM | POA: Insufficient documentation

## 2011-05-22 ENCOUNTER — Other Ambulatory Visit: Payer: Medicare Other | Admitting: Vascular Surgery

## 2011-05-30 ENCOUNTER — Ambulatory Visit: Payer: Medicare Other | Admitting: Vascular Surgery

## 2011-06-14 ENCOUNTER — Encounter (HOSPITAL_BASED_OUTPATIENT_CLINIC_OR_DEPARTMENT_OTHER): Payer: Medicare Other | Attending: Internal Medicine

## 2011-06-14 DIAGNOSIS — L97809 Non-pressure chronic ulcer of other part of unspecified lower leg with unspecified severity: Secondary | ICD-10-CM | POA: Insufficient documentation

## 2011-07-17 ENCOUNTER — Encounter (HOSPITAL_BASED_OUTPATIENT_CLINIC_OR_DEPARTMENT_OTHER): Payer: Medicare Other | Attending: Internal Medicine

## 2011-07-17 DIAGNOSIS — I872 Venous insufficiency (chronic) (peripheral): Secondary | ICD-10-CM | POA: Insufficient documentation

## 2011-07-17 DIAGNOSIS — L97809 Non-pressure chronic ulcer of other part of unspecified lower leg with unspecified severity: Secondary | ICD-10-CM | POA: Insufficient documentation

## 2011-08-14 ENCOUNTER — Encounter (HOSPITAL_BASED_OUTPATIENT_CLINIC_OR_DEPARTMENT_OTHER): Payer: Medicare Other

## 2011-08-29 ENCOUNTER — Emergency Department (HOSPITAL_COMMUNITY): Payer: Medicare Other

## 2011-08-29 ENCOUNTER — Emergency Department (HOSPITAL_COMMUNITY)
Admission: EM | Admit: 2011-08-29 | Discharge: 2011-08-29 | Disposition: A | Discharge status: Home / Self Care | Payer: Medicare Other | Attending: Emergency Medicine | Admitting: Emergency Medicine

## 2011-08-29 ENCOUNTER — Other Ambulatory Visit: Payer: Self-pay

## 2011-08-29 ENCOUNTER — Encounter (HOSPITAL_COMMUNITY): Payer: Self-pay | Admitting: *Deleted

## 2011-08-29 DIAGNOSIS — R1110 Vomiting, unspecified: Secondary | ICD-10-CM

## 2011-08-29 DIAGNOSIS — R404 Transient alteration of awareness: Secondary | ICD-10-CM | POA: Insufficient documentation

## 2011-08-29 DIAGNOSIS — N289 Disorder of kidney and ureter, unspecified: Secondary | ICD-10-CM

## 2011-08-29 DIAGNOSIS — E876 Hypokalemia: Secondary | ICD-10-CM | POA: Insufficient documentation

## 2011-08-29 DIAGNOSIS — J069 Acute upper respiratory infection, unspecified: Secondary | ICD-10-CM | POA: Insufficient documentation

## 2011-08-29 DIAGNOSIS — Z79899 Other long term (current) drug therapy: Secondary | ICD-10-CM | POA: Insufficient documentation

## 2011-08-29 DIAGNOSIS — E860 Dehydration: Secondary | ICD-10-CM | POA: Insufficient documentation

## 2011-08-29 DIAGNOSIS — R5383 Other fatigue: Secondary | ICD-10-CM | POA: Insufficient documentation

## 2011-08-29 DIAGNOSIS — R42 Dizziness and giddiness: Secondary | ICD-10-CM | POA: Insufficient documentation

## 2011-08-29 DIAGNOSIS — Z7982 Long term (current) use of aspirin: Secondary | ICD-10-CM | POA: Insufficient documentation

## 2011-08-29 DIAGNOSIS — Z794 Long term (current) use of insulin: Secondary | ICD-10-CM | POA: Insufficient documentation

## 2011-08-29 DIAGNOSIS — E119 Type 2 diabetes mellitus without complications: Secondary | ICD-10-CM | POA: Insufficient documentation

## 2011-08-29 DIAGNOSIS — R112 Nausea with vomiting, unspecified: Secondary | ICD-10-CM | POA: Insufficient documentation

## 2011-08-29 DIAGNOSIS — R5381 Other malaise: Secondary | ICD-10-CM | POA: Insufficient documentation

## 2011-08-29 DIAGNOSIS — M1990 Unspecified osteoarthritis, unspecified site: Secondary | ICD-10-CM | POA: Insufficient documentation

## 2011-08-29 DIAGNOSIS — R197 Diarrhea, unspecified: Secondary | ICD-10-CM

## 2011-08-29 LAB — BASIC METABOLIC PANEL
BUN: 25 mg/dL — ABNORMAL HIGH (ref 6–23)
CO2: 29 mEq/L (ref 19–32)
Calcium: 9.8 mg/dL (ref 8.4–10.5)
Chloride: 87 mEq/L — ABNORMAL LOW (ref 96–112)
Creatinine, Ser: 1.65 mg/dL — ABNORMAL HIGH (ref 0.50–1.10)
GFR calc Af Amer: 36 mL/min — ABNORMAL LOW (ref >90)
GFR calc non Af Amer: 31 mL/min — ABNORMAL LOW (ref >90)
Glucose, Bld: 243 mg/dL — ABNORMAL HIGH (ref 70–99)
Potassium: 3 mEq/L — ABNORMAL LOW (ref 3.5–5.1)
Sodium: 130 mEq/L — ABNORMAL LOW (ref 135–145)

## 2011-08-29 LAB — CBC
HCT: 31.3 % — ABNORMAL LOW (ref 36.0–46.0)
Hemoglobin: 10.8 g/dL — ABNORMAL LOW (ref 12.0–15.0)
MCH: 29.7 pg (ref 26.0–34.0)
MCHC: 34.5 g/dL (ref 30.0–36.0)
MCV: 86 fL (ref 78.0–100.0)
Platelets: 296 10*3/uL (ref 150–400)
RBC: 3.64 MIL/uL — ABNORMAL LOW (ref 3.87–5.11)
RDW: 15.9 % — ABNORMAL HIGH (ref 11.5–15.5)
WBC: 6.2 10*3/uL (ref 4.0–10.5)

## 2011-08-29 LAB — TROPONIN I: Troponin I: <0.3 ng/mL (ref <0.30)

## 2011-08-29 MED ORDER — ONDANSETRON HCL 4 MG PO TABS: 4.0000 mg | ORAL_TABLET | Freq: Four times a day (QID) | ORAL | Status: AC

## 2011-08-29 MED ORDER — SODIUM CHLORIDE 0.9 % IV BOLUS (SEPSIS)
1000.0000 mL | Freq: Once | INTRAVENOUS | Status: AC
  Administered 2011-08-29: 1000 mL via INTRAVENOUS

## 2011-08-29 MED ORDER — POTASSIUM CHLORIDE CRYS ER 20 MEQ PO TBCR
40.0000 meq | EXTENDED_RELEASE_TABLET | Freq: Once | ORAL | Status: AC
  Administered 2011-08-29: 40 meq via ORAL
  Filled 2011-08-29: qty 2

## 2011-08-29 MED ORDER — PROMETHAZINE HCL 25 MG PO TABS: 25.0000 mg | ORAL_TABLET | Freq: Four times a day (QID) | ORAL | Status: AC | PRN

## 2011-08-29 MED ORDER — PROMETHAZINE HCL 25 MG PO TABS: 12.5000 mg | ORAL_TABLET | Freq: Three times a day (TID) | ORAL | Status: AC | PRN

## 2011-08-29 NOTE — ED Provider Notes (Signed)
History    65yf with diarrhea for past week. Nausea and vomiting today. No fever or chills. Denies abdominal pain. NO urinary complaints. Feels generally weak. Finishing zpack for sinusitis. Has been having some facial pain and congestion. No blood in stool or melena. Occasional nonproducitve cough. No sob.  CSN: [id number] Arrival date & time: 08/29/2011  2:25 AM   First MD Initiated Contact with Patient 08/29/11 [phone]      Chief Complaint  Patient presents with  . Loss of Consciousness    (Consider location/radiation/quality/duration/timing/severity/associated sxs/prior treatment) HPI  Past Medical History  Diagnosis Date  . Venous insufficiency   . Venous stasis ulcer   . Diabetes mellitus   . Neuropathy   . DJD (degenerative joint disease)   . Anemia     Past Surgical History  Procedure Date  . Tonsilectomy, adenoidectomy, bilateral myringotomy and tubes   . Mouth surgery     Family History  Problem Relation Age of Onset  . Heart disease Mother   . Heart disease Father   . Diabetes Maternal Grandmother     History  Substance Use Topics  . Smoking status: Never Smoker   . Smokeless tobacco: Not on file  . Alcohol Use: No    OB History    Grav Para Term Preterm Abortions TAB SAB Ect Mult Living                  Review of Systems   Review of symptoms negative unless otherwise noted in HPI.   Allergies  Actos; Celebrex; Ibuprofen; and Neosporin  Home Medications   Current Outpatient Rx  Name Route Sig Dispense Refill  . ASPIRIN 325 MG PO TABS Oral Take 325 mg by mouth 2 (two) times daily. Two tabs BID     . AZITHROMYCIN 250 MG PO TABS Oral Take 250 mg by mouth daily.     . BUMETANIDE 2 MG PO TABS Oral Take 2 mg by mouth 2 (two) times daily.      . COD LIVER OIL PO Oral Take 1,000 mg by mouth daily.      Marland Kitchen HYDROCHLOROTHIAZIDE 25 MG PO TABS Oral Take 25 mg by mouth 1 day or 1 dose.      . INSULIN ISOPHANE & REGULAR (70-30) 100 UNIT/ML Isabella SUSP  Subcutaneous Inject 25-40 Units into the skin 2 (two) times daily with a meal. Per sliding scale. Usually 35-40 units in the morning and 25 units in the evening.    Marland Kitchen POLYSACCHARIDE IRON COMPLEX 150 MG PO CAPS Oral Take 150 mg by mouth daily.     Marland Kitchen BACID PO TABS Oral Take 2 tablets by mouth daily after breakfast.      . METFORMIN HCL ER (MOD) 1000 MG PO TB24 Oral Take 1,000 mg by mouth 2 (two) times daily.      Marland Kitchen ONDANSETRON HCL 4 MG PO TABS Oral Take 16 mg by mouth every 8 (eight) hours as needed.      Marland Kitchen OVER THE COUNTER MEDICATION Oral Take 1 tablet by mouth daily. Herbal product     . PENTOXIFYLLINE 400 MG PO TBCR Oral Take 400 mg by mouth 2 (two) times daily.      Marland Kitchen VITAMIN B-1 100 MG PO TABS Oral Take 100 mg by mouth daily.     Marland Kitchen VITAMIN C 500 MG PO TABS Oral Take 500 mg by mouth daily.        BP 145/64  Pulse 67  Temp(Src) 98.2 F (  36.8 C) (Oral)  Resp 18  SpO2 100%  Physical Exam  Nursing note and vitals reviewed. Constitutional: She appears well-developed and well-nourished. No distress.  HENT:  Head: Normocephalic and atraumatic.  Right Ear: External ear normal.  Left Ear: External ear normal.  Eyes: Conjunctivae are normal. Pupils are equal, round, and reactive to light. Right eye exhibits no discharge. Left eye exhibits no discharge.  Neck: Neck supple.  Cardiovascular: Normal rate, regular rhythm and normal heart sounds.  Exam reveals no gallop and no friction rub.   No murmur heard. Pulmonary/Chest: Effort normal and breath sounds normal. No respiratory distress.  Abdominal: Soft. She exhibits no distension. There is no tenderness.  Genitourinary:       No cva tenderness  Musculoskeletal: She exhibits no edema and no tenderness.  Neurological: She is alert.  Skin: Skin is warm and dry.  Psychiatric: She has a normal mood and affect. Her behavior is normal. Thought content normal.    ED Course  Procedures (including critical care time)  Labs Reviewed  CBC -  Abnormal; Notable for the following:    RBC 3.64 (*)    Hemoglobin 10.8 (*)    HCT 31.3 (*)    RDW 15.9 (*)    All other components within normal limits  BASIC METABOLIC PANEL - Abnormal; Notable for the following:    Sodium 130 (*)    Potassium 3.0 (*)    Chloride 87 (*)    Glucose, Bld 243 (*)    BUN 25 (*)    Creatinine, Ser 1.65 (*)    GFR calc non Af Amer 31 (*)    GFR calc Af Amer 36 (*)    All other components within normal limits  TROPONIN I   Dg Chest 2 View  08/29/2011  *RADIOLOGY REPORT*  Clinical Data: Cough, dehydration, diarrhea and dizziness, decreased blood sugar  CHEST - 2 VIEW  Comparison: March 26, 2006  Findings: The cardiac silhouette, mediastinum, pulmonary vasculature are within normal limits.  Both lungs are clear. There is no acute bony abnormality.  IMPRESSION: There is no evidence of acute cardiac or pulmonary process.  Original Report Authenticated By: Brandon Melnick, M.D.   EKG:  Rhythm:  Sinus brady Rate: 54 Axis: normal Intervals: normal ST segments: NS ST changes. Flattening v2/aVL, similar to previous.   1. Hypokalemia   2. Renal insufficiency   3. Vomiting   4. Diarrhea   5. URI (upper respiratory infection)       MDM  15XW with multiple complaints. URI, likely viral. Low clinical suspicion for SBI. Abdominal exam benign. Mild hypoK. Repleted. Suspect 2/2 gi loss. Pt currently feels better. Plan symptomatic tx and outpt fu.       Raeford Razor, MD 09/05/11 [phone]

## 2011-08-29 NOTE — ED Notes (Signed)
Pt been having diarr for wk; been on z pack for sinus infection; tonight became nauseated and vomited. No flu shot this year; afebrile.

## 2011-08-29 NOTE — ED Notes (Signed)
4 zofran and 200 liters normal saline given by EMS.

## 2011-09-18 ENCOUNTER — Encounter (HOSPITAL_BASED_OUTPATIENT_CLINIC_OR_DEPARTMENT_OTHER): Payer: Medicare Other | Attending: Internal Medicine

## 2011-09-18 DIAGNOSIS — Z794 Long term (current) use of insulin: Secondary | ICD-10-CM | POA: Insufficient documentation

## 2011-09-18 DIAGNOSIS — N289 Disorder of kidney and ureter, unspecified: Secondary | ICD-10-CM | POA: Insufficient documentation

## 2011-09-18 DIAGNOSIS — E119 Type 2 diabetes mellitus without complications: Secondary | ICD-10-CM | POA: Insufficient documentation

## 2011-09-18 DIAGNOSIS — L97309 Non-pressure chronic ulcer of unspecified ankle with unspecified severity: Secondary | ICD-10-CM | POA: Insufficient documentation

## 2011-09-18 DIAGNOSIS — Z79899 Other long term (current) drug therapy: Secondary | ICD-10-CM | POA: Insufficient documentation

## 2011-09-18 DIAGNOSIS — D649 Anemia, unspecified: Secondary | ICD-10-CM | POA: Insufficient documentation

## 2011-09-18 DIAGNOSIS — I872 Venous insufficiency (chronic) (peripheral): Secondary | ICD-10-CM | POA: Insufficient documentation

## 2011-10-16 ENCOUNTER — Encounter (HOSPITAL_BASED_OUTPATIENT_CLINIC_OR_DEPARTMENT_OTHER): Payer: Medicare Other | Attending: Internal Medicine

## 2011-10-16 DIAGNOSIS — Z79899 Other long term (current) drug therapy: Secondary | ICD-10-CM | POA: Insufficient documentation

## 2011-10-16 DIAGNOSIS — E669 Obesity, unspecified: Secondary | ICD-10-CM | POA: Insufficient documentation

## 2011-10-16 DIAGNOSIS — E119 Type 2 diabetes mellitus without complications: Secondary | ICD-10-CM | POA: Insufficient documentation

## 2011-10-16 DIAGNOSIS — I872 Venous insufficiency (chronic) (peripheral): Secondary | ICD-10-CM | POA: Insufficient documentation

## 2011-10-16 DIAGNOSIS — L97809 Non-pressure chronic ulcer of other part of unspecified lower leg with unspecified severity: Secondary | ICD-10-CM | POA: Insufficient documentation

## 2011-11-13 ENCOUNTER — Encounter (HOSPITAL_BASED_OUTPATIENT_CLINIC_OR_DEPARTMENT_OTHER): Payer: Medicare Other | Attending: Internal Medicine

## 2011-11-13 DIAGNOSIS — Z79899 Other long term (current) drug therapy: Secondary | ICD-10-CM | POA: Insufficient documentation

## 2011-11-13 DIAGNOSIS — I872 Venous insufficiency (chronic) (peripheral): Secondary | ICD-10-CM | POA: Insufficient documentation

## 2011-11-13 DIAGNOSIS — L97809 Non-pressure chronic ulcer of other part of unspecified lower leg with unspecified severity: Secondary | ICD-10-CM | POA: Insufficient documentation

## 2011-11-13 DIAGNOSIS — E119 Type 2 diabetes mellitus without complications: Secondary | ICD-10-CM | POA: Insufficient documentation

## 2011-11-13 DIAGNOSIS — E669 Obesity, unspecified: Secondary | ICD-10-CM | POA: Insufficient documentation

## 2011-12-11 ENCOUNTER — Encounter (HOSPITAL_BASED_OUTPATIENT_CLINIC_OR_DEPARTMENT_OTHER): Payer: Medicare Other | Attending: Internal Medicine

## 2011-12-11 DIAGNOSIS — Z79899 Other long term (current) drug therapy: Secondary | ICD-10-CM | POA: Insufficient documentation

## 2011-12-11 DIAGNOSIS — L97809 Non-pressure chronic ulcer of other part of unspecified lower leg with unspecified severity: Secondary | ICD-10-CM | POA: Insufficient documentation

## 2011-12-11 DIAGNOSIS — I872 Venous insufficiency (chronic) (peripheral): Secondary | ICD-10-CM | POA: Insufficient documentation

## 2011-12-11 DIAGNOSIS — IMO0001 Reserved for inherently not codable concepts without codable children: Secondary | ICD-10-CM | POA: Insufficient documentation

## 2011-12-11 DIAGNOSIS — Z794 Long term (current) use of insulin: Secondary | ICD-10-CM | POA: Insufficient documentation

## 2012-01-12 ENCOUNTER — Encounter (HOSPITAL_BASED_OUTPATIENT_CLINIC_OR_DEPARTMENT_OTHER): Payer: Medicare Other | Attending: Internal Medicine

## 2012-01-12 DIAGNOSIS — IMO0001 Reserved for inherently not codable concepts without codable children: Secondary | ICD-10-CM | POA: Insufficient documentation

## 2012-01-12 DIAGNOSIS — E1165 Type 2 diabetes mellitus with hyperglycemia: Secondary | ICD-10-CM

## 2012-01-12 DIAGNOSIS — Z79899 Other long term (current) drug therapy: Secondary | ICD-10-CM | POA: Insufficient documentation

## 2012-01-12 DIAGNOSIS — I872 Venous insufficiency (chronic) (peripheral): Secondary | ICD-10-CM | POA: Insufficient documentation

## 2012-01-12 DIAGNOSIS — L97809 Non-pressure chronic ulcer of other part of unspecified lower leg with unspecified severity: Secondary | ICD-10-CM | POA: Insufficient documentation

## 2012-01-12 DIAGNOSIS — Z794 Long term (current) use of insulin: Secondary | ICD-10-CM | POA: Insufficient documentation

## 2012-01-15 ENCOUNTER — Encounter (HOSPITAL_BASED_OUTPATIENT_CLINIC_OR_DEPARTMENT_OTHER): Payer: Medicare Other

## 2013-12-24 ENCOUNTER — Ambulatory Visit (INDEPENDENT_AMBULATORY_CARE_PROVIDER_SITE_OTHER): Payer: Medicare Other | Admitting: Infectious Diseases

## 2013-12-24 ENCOUNTER — Other Ambulatory Visit: Payer: Self-pay | Admitting: *Deleted

## 2013-12-24 ENCOUNTER — Encounter: Payer: Self-pay | Admitting: Infectious Diseases

## 2013-12-24 VITALS — BP 145/69 | HR 81 | Temp 97.7°F | Ht 67.0 in | Wt 307.0 lb

## 2013-12-24 DIAGNOSIS — E1169 Type 2 diabetes mellitus with other specified complication: Secondary | ICD-10-CM

## 2013-12-24 DIAGNOSIS — E1165 Type 2 diabetes mellitus with hyperglycemia: Secondary | ICD-10-CM | POA: Insufficient documentation

## 2013-12-24 DIAGNOSIS — IMO0002 Reserved for concepts with insufficient information to code with codable children: Secondary | ICD-10-CM

## 2013-12-24 DIAGNOSIS — L98499 Non-pressure chronic ulcer of skin of other sites with unspecified severity: Principal | ICD-10-CM

## 2013-12-24 DIAGNOSIS — L97929 Non-pressure chronic ulcer of unspecified part of left lower leg with unspecified severity: Secondary | ICD-10-CM

## 2013-12-24 DIAGNOSIS — E11622 Type 2 diabetes mellitus with other skin ulcer: Secondary | ICD-10-CM

## 2013-12-24 DIAGNOSIS — E118 Type 2 diabetes mellitus with unspecified complications: Secondary | ICD-10-CM

## 2013-12-24 DIAGNOSIS — I83029 Varicose veins of left lower extremity with ulcer of unspecified site: Secondary | ICD-10-CM | POA: Insufficient documentation

## 2013-12-24 MED ORDER — DOXYCYCLINE HYCLATE 50 MG PO CAPS: 100.0000 mg | ORAL_CAPSULE | Freq: Two times a day (BID) | ORAL | Status: DC

## 2013-12-24 MED ORDER — DOXYCYCLINE HYCLATE 100 MG PO TABS: 100.0000 mg | ORAL_TABLET | Freq: Two times a day (BID) | ORAL | Status: DC

## 2013-12-24 NOTE — Assessment & Plan Note (Addendum)
Will get her previous MRI.  Had unaboot previously, did not help. States that dressing can make her skin breakdown due to pressure. I will defer her dressings to her wound care consultants. We spoke about starting her on antibiotics for her wound- that it is difficult to discern colonization from infection. She has improved in the last 2-3 weeks without antibiotics. Will start her on doxy (avoid IV for now) for the next 2 weeks and see her back in ID clinic.  She needs to keep her wound elevated, needs fluid, weight management.  We spoke about the importance of keeping her DM well controlled as well.

## 2013-12-24 NOTE — Progress Notes (Signed)
   Subjective:    Patient ID: [name], female    DOB: 24-May-1946, 69 y.o.   MRN: [id number]  HPI 68 yo F with hx of DM2 (20 yrs), CRI (sees Dr Clover Mealy), and LE ulcers since 1996. She has had a LLE ulcer since 2011. She had Cx done during debridement 11-30-13 showing 2+ P aeruginosa (R- aztreonam and cipro) and 1+ CNS.  Has been followed by [facility] who comes to her house. Her wound was improving until she got psuedomonas, "it turned totally green'. Has been putting water:vinegar, zinc oxide and santyl on wound. Husband feels like it has been improving.  Had deep debridement in April. Has been on no anbx.  FSG have been 90-200. Last A1C 5.8%. Has had eye exam in last year (cataracts and glaucoma).   FHx/Sochx reviewed.   Review of Systems  Constitutional: Negative for fever and chills.  Cardiovascular: Positive for leg swelling.  Gastrointestinal: Negative for diarrhea and constipation.  Genitourinary: Negative for difficulty urinating.  Neurological: Positive for numbness.       Objective:   Physical Exam  Constitutional: She appears well-developed and well-nourished.  HENT:  Mouth/Throat: No oropharyngeal exudate.  Eyes: EOM are normal. Pupils are equal, round, and reactive to light.  Neck: Neck supple.  Cardiovascular: An irregularly irregular rhythm present.  Pulmonary/Chest: Effort normal and breath sounds normal.  Abdominal: Soft. Bowel sounds are normal. There is no tenderness.  Musculoskeletal:       Feet:  Lymphadenopathy:    She has no cervical adenopathy.          Assessment & Plan:

## 2014-01-07 ENCOUNTER — Encounter: Payer: Self-pay | Admitting: Infectious Diseases

## 2014-01-07 ENCOUNTER — Ambulatory Visit (INDEPENDENT_AMBULATORY_CARE_PROVIDER_SITE_OTHER): Payer: Medicare Other | Admitting: Infectious Diseases

## 2014-01-07 VITALS — BP 154/70 | HR 77 | Temp 97.4°F | Ht 67.0 in | Wt 313.0 lb

## 2014-01-07 DIAGNOSIS — L98499 Non-pressure chronic ulcer of skin of other sites with unspecified severity: Secondary | ICD-10-CM

## 2014-01-07 DIAGNOSIS — E11622 Type 2 diabetes mellitus with other skin ulcer: Secondary | ICD-10-CM

## 2014-01-07 DIAGNOSIS — E1169 Type 2 diabetes mellitus with other specified complication: Secondary | ICD-10-CM

## 2014-01-07 MED ORDER — DOXYCYCLINE HYCLATE 50 MG PO CAPS: 100.0000 mg | ORAL_CAPSULE | Freq: Two times a day (BID) | ORAL | Status: DC

## 2014-01-07 NOTE — Assessment & Plan Note (Signed)
Her wound appears to be doing well. Will continue her on doxy until we see her in f/u in early June. She is being cautious about her diet and interactions with her anbx. She needs to improve her LE edema.

## 2014-01-07 NOTE — Progress Notes (Signed)
   Subjective:    Patient ID: [name], female    DOB: [birth year], 68 y.o.   MRN: [id number]  HPI 68 yo F with hx of DM2 (20 yrs), CRI (sees Dr Clover Mealy), and LE ulcers since 1996. She has had a LLE ulcer since 2011. She had Cx done during debridement 11-30-13 showing 2+ P aeruginosa (R- aztreonam and cipro) and 1+ CNS.  Has been followed by [facility] who comes to her house. Her wound was improving until she got psuedomonas, "it turned totally green'. Has been putting water:vinegar, zinc oxide and santyl on wound. Husband feels like it has been improving.  Had deep debridement in April. Has been on no anbx.  At ID f/u ~ 2 weeks ago, she was started on doxy (trying to avoid IV). Her wound has been having some yellow d/c, green more recently.  No f/c. LE edema has been improving.      Review of Systems     Objective:   Physical Exam  Constitutional: She appears well-developed and well-nourished.  Musculoskeletal:       Legs:         Assessment & Plan:

## 2014-01-11 ENCOUNTER — Emergency Department (HOSPITAL_COMMUNITY): Payer: Medicare Other

## 2014-01-11 ENCOUNTER — Inpatient Hospital Stay (HOSPITAL_COMMUNITY)
Admission: EM | Admit: 2014-01-11 | Discharge: 2014-01-17 | DRG: 854 | Disposition: A | Discharge status: Home Health | Payer: Medicare Other | Attending: Internal Medicine | Admitting: Internal Medicine

## 2014-01-11 ENCOUNTER — Encounter (HOSPITAL_COMMUNITY): Payer: Self-pay | Admitting: Emergency Medicine

## 2014-01-11 DIAGNOSIS — L02419 Cutaneous abscess of limb, unspecified: Secondary | ICD-10-CM | POA: Diagnosis present

## 2014-01-11 DIAGNOSIS — I480 Paroxysmal atrial fibrillation: Secondary | ICD-10-CM

## 2014-01-11 DIAGNOSIS — E118 Type 2 diabetes mellitus with unspecified complications: Secondary | ICD-10-CM

## 2014-01-11 DIAGNOSIS — R197 Diarrhea, unspecified: Secondary | ICD-10-CM | POA: Diagnosis not present

## 2014-01-11 DIAGNOSIS — I83029 Varicose veins of left lower extremity with ulcer of unspecified site: Secondary | ICD-10-CM | POA: Diagnosis present

## 2014-01-11 DIAGNOSIS — Z1635 Resistance to multiple antimicrobial drugs: Secondary | ICD-10-CM

## 2014-01-11 DIAGNOSIS — T502X5A Adverse effect of carbonic-anhydrase inhibitors, benzothiadiazides and other diuretics, initial encounter: Secondary | ICD-10-CM | POA: Diagnosis not present

## 2014-01-11 DIAGNOSIS — M1990 Unspecified osteoarthritis, unspecified site: Secondary | ICD-10-CM | POA: Diagnosis present

## 2014-01-11 DIAGNOSIS — D649 Anemia, unspecified: Secondary | ICD-10-CM | POA: Diagnosis present

## 2014-01-11 DIAGNOSIS — L03119 Cellulitis of unspecified part of limb: Secondary | ICD-10-CM

## 2014-01-11 DIAGNOSIS — N183 Chronic kidney disease, stage 3 unspecified: Secondary | ICD-10-CM | POA: Diagnosis present

## 2014-01-11 DIAGNOSIS — I83009 Varicose veins of unspecified lower extremity with ulcer of unspecified site: Secondary | ICD-10-CM

## 2014-01-11 DIAGNOSIS — N179 Acute kidney failure, unspecified: Secondary | ICD-10-CM | POA: Diagnosis present

## 2014-01-11 DIAGNOSIS — I4891 Unspecified atrial fibrillation: Secondary | ICD-10-CM | POA: Diagnosis not present

## 2014-01-11 DIAGNOSIS — E1129 Type 2 diabetes mellitus with other diabetic kidney complication: Secondary | ICD-10-CM | POA: Diagnosis present

## 2014-01-11 DIAGNOSIS — E11622 Type 2 diabetes mellitus with other skin ulcer: Secondary | ICD-10-CM | POA: Diagnosis present

## 2014-01-11 DIAGNOSIS — IMO0002 Reserved for concepts with insufficient information to code with codable children: Secondary | ICD-10-CM | POA: Diagnosis present

## 2014-01-11 DIAGNOSIS — N039 Chronic nephritic syndrome with unspecified morphologic changes: Secondary | ICD-10-CM

## 2014-01-11 DIAGNOSIS — E876 Hypokalemia: Secondary | ICD-10-CM

## 2014-01-11 DIAGNOSIS — Z6841 Body Mass Index (BMI) 40.0 and over, adult: Secondary | ICD-10-CM

## 2014-01-11 DIAGNOSIS — E1169 Type 2 diabetes mellitus with other specified complication: Secondary | ICD-10-CM

## 2014-01-11 DIAGNOSIS — Z794 Long term (current) use of insulin: Secondary | ICD-10-CM

## 2014-01-11 DIAGNOSIS — B965 Pseudomonas (aeruginosa) (mallei) (pseudomallei) as the cause of diseases classified elsewhere: Secondary | ICD-10-CM | POA: Diagnosis present

## 2014-01-11 DIAGNOSIS — D631 Anemia in chronic kidney disease: Secondary | ICD-10-CM | POA: Diagnosis present

## 2014-01-11 DIAGNOSIS — L97929 Non-pressure chronic ulcer of unspecified part of left lower leg with unspecified severity: Secondary | ICD-10-CM | POA: Diagnosis present

## 2014-01-11 DIAGNOSIS — L97909 Non-pressure chronic ulcer of unspecified part of unspecified lower leg with unspecified severity: Secondary | ICD-10-CM | POA: Diagnosis present

## 2014-01-11 DIAGNOSIS — I872 Venous insufficiency (chronic) (peripheral): Secondary | ICD-10-CM | POA: Diagnosis present

## 2014-01-11 DIAGNOSIS — N189 Chronic kidney disease, unspecified: Secondary | ICD-10-CM

## 2014-01-11 DIAGNOSIS — E1165 Type 2 diabetes mellitus with hyperglycemia: Secondary | ICD-10-CM | POA: Diagnosis present

## 2014-01-11 DIAGNOSIS — E871 Hypo-osmolality and hyponatremia: Secondary | ICD-10-CM | POA: Diagnosis not present

## 2014-01-11 DIAGNOSIS — L03116 Cellulitis of left lower limb: Secondary | ICD-10-CM

## 2014-01-11 DIAGNOSIS — Z79899 Other long term (current) drug therapy: Secondary | ICD-10-CM

## 2014-01-11 DIAGNOSIS — D509 Iron deficiency anemia, unspecified: Secondary | ICD-10-CM | POA: Diagnosis present

## 2014-01-11 DIAGNOSIS — T3695XA Adverse effect of unspecified systemic antibiotic, initial encounter: Secondary | ICD-10-CM | POA: Diagnosis not present

## 2014-01-11 DIAGNOSIS — A419 Sepsis, unspecified organism: Principal | ICD-10-CM | POA: Diagnosis present

## 2014-01-11 DIAGNOSIS — N058 Unspecified nephritic syndrome with other morphologic changes: Secondary | ICD-10-CM | POA: Diagnosis present

## 2014-01-11 LAB — CBC WITH DIFFERENTIAL/PLATELET
BASOS PCT: 0 % (ref 0–1)
Basophils Absolute: 0 10*3/uL (ref 0.0–0.1)
EOS ABS: 0 10*3/uL (ref 0.0–0.7)
Eosinophils Relative: 0 % (ref 0–5)
HCT: 23.3 % — ABNORMAL LOW (ref 36.0–46.0)
Hemoglobin: 8 g/dL — ABNORMAL LOW (ref 12.0–15.0)
Lymphocytes Relative: 3 % — ABNORMAL LOW (ref 12–46)
Lymphs Abs: 0.5 10*3/uL — ABNORMAL LOW (ref 0.7–4.0)
MCH: 30.2 pg (ref 26.0–34.0)
MCHC: 34.3 g/dL (ref 30.0–36.0)
MCV: 87.9 fL (ref 78.0–100.0)
Monocytes Absolute: 0.8 10*3/uL (ref 0.1–1.0)
Monocytes Relative: 4 % (ref 3–12)
NEUTROS PCT: 93 % — AB (ref 43–77)
Neutro Abs: 17 10*3/uL — ABNORMAL HIGH (ref 1.7–7.7)
PLATELETS: 199 10*3/uL (ref 150–400)
RBC: 2.65 MIL/uL — ABNORMAL LOW (ref 3.87–5.11)
RDW: 17.1 % — AB (ref 11.5–15.5)
WBC: 18.2 10*3/uL — ABNORMAL HIGH (ref 4.0–10.5)

## 2014-01-11 LAB — I-STAT CG4 LACTIC ACID, ED: Lactic Acid, Venous: 1.58 mmol/L (ref 0.5–2.2)

## 2014-01-11 LAB — BASIC METABOLIC PANEL
BUN: 10 mg/dL (ref 6–23)
CALCIUM: 9.9 mg/dL (ref 8.4–10.5)
CO2: 25 mEq/L (ref 19–32)
Chloride: 97 mEq/L (ref 96–112)
Creatinine, Ser: 0.98 mg/dL (ref 0.50–1.10)
GFR, EST AFRICAN AMERICAN: 67 mL/min — AB (ref >90)
GFR, EST NON AFRICAN AMERICAN: 58 mL/min — AB (ref >90)
Glucose, Bld: 104 mg/dL — ABNORMAL HIGH (ref 70–99)
POTASSIUM: 4.6 meq/L (ref 3.7–5.3)
SODIUM: 139 meq/L (ref 137–147)

## 2014-01-11 LAB — CBG MONITORING, ED: Glucose-Capillary: 158 mg/dL — ABNORMAL HIGH (ref 70–99)

## 2014-01-11 MED ORDER — GLIPIZIDE 10 MG PO TABS
10.0000 mg | ORAL_TABLET | Freq: Two times a day (BID) | ORAL | Status: DC
  Administered 2014-01-12: 10 mg via ORAL
  Filled 2014-01-11 (×2): qty 1

## 2014-01-11 MED ORDER — INSULIN ASPART 100 UNIT/ML ~~LOC~~ SOLN
0.0000 [IU] | Freq: Three times a day (TID) | SUBCUTANEOUS | Status: DC
  Administered 2014-01-12 (×2): 3 [IU] via SUBCUTANEOUS

## 2014-01-11 MED ORDER — PIPERACILLIN-TAZOBACTAM 3.375 G IVPB
3.3750 g | Freq: Three times a day (TID) | INTRAVENOUS | Status: DC
  Administered 2014-01-12 – 2014-01-15 (×10): 3.375 g via INTRAVENOUS
  Filled 2014-01-11 (×12): qty 50

## 2014-01-11 MED ORDER — LATANOPROST 0.005 % OP SOLN
1.0000 [drp] | Freq: Every day | OPHTHALMIC | Status: DC
  Administered 2014-01-12 – 2014-01-16 (×6): 1 [drp] via OPHTHALMIC
  Filled 2014-01-11: qty 2.5

## 2014-01-11 MED ORDER — HEPARIN SODIUM (PORCINE) 5000 UNIT/ML IJ SOLN
5000.0000 [IU] | Freq: Three times a day (TID) | INTRAMUSCULAR | Status: DC
  Administered 2014-01-12 – 2014-01-17 (×15): 5000 [IU] via SUBCUTANEOUS
  Filled 2014-01-11 (×21): qty 1

## 2014-01-11 MED ORDER — ACETIC ACID 0.25 % IR SOLN
Freq: Every day | Status: DC
  Administered 2014-01-12: 16:00:00
  Filled 2014-01-11: qty 500

## 2014-01-11 MED ORDER — HYDROCHLOROTHIAZIDE 25 MG PO TABS
25.0000 mg | ORAL_TABLET | Freq: Every day | ORAL | Status: DC
  Administered 2014-01-12: 25 mg via ORAL
  Filled 2014-01-11: qty 1

## 2014-01-11 MED ORDER — INSULIN ASPART PROT & ASPART (70-30 MIX) 100 UNIT/ML ~~LOC~~ SUSP
80.0000 [IU] | Freq: Two times a day (BID) | SUBCUTANEOUS | Status: DC
  Administered 2014-01-12 – 2014-01-17 (×8): 80 [IU] via SUBCUTANEOUS
  Filled 2014-01-11 (×2): qty 10

## 2014-01-11 MED ORDER — BACID PO TABS
2.0000 | ORAL_TABLET | Freq: Every day | ORAL | Status: DC
  Filled 2014-01-11: qty 2

## 2014-01-11 MED ORDER — TIMOLOL MALEATE 0.5 % OP SOLN
1.0000 [drp] | Freq: Every day | OPHTHALMIC | Status: DC
  Administered 2014-01-12 – 2014-01-17 (×5): 1 [drp] via OPHTHALMIC
  Filled 2014-01-11 (×2): qty 5

## 2014-01-11 MED ORDER — HYDROMORPHONE HCL PF 1 MG/ML IJ SOLN: 1.0000 mg | INTRAMUSCULAR | Status: DC | PRN

## 2014-01-11 MED ORDER — MUPIROCIN 2 % EX OINT
1.0000 "application " | TOPICAL_OINTMENT | Freq: Every day | CUTANEOUS | Status: DC
  Administered 2014-01-13 – 2014-01-16 (×3): 1 via TOPICAL
  Filled 2014-01-11 (×2): qty 22

## 2014-01-11 MED ORDER — TRAMADOL HCL 50 MG PO TABS
50.0000 mg | ORAL_TABLET | Freq: Four times a day (QID) | ORAL | Status: DC | PRN
  Administered 2014-01-12 – 2014-01-15 (×6): 50 mg via ORAL
  Filled 2014-01-11 (×6): qty 1

## 2014-01-11 MED ORDER — HYDROMORPHONE HCL PF 1 MG/ML IJ SOLN: 0.5000 mg | INTRAMUSCULAR | Status: DC | PRN

## 2014-01-11 MED ORDER — ACETAMINOPHEN 325 MG PO TABS
650.0000 mg | ORAL_TABLET | Freq: Four times a day (QID) | ORAL | Status: DC | PRN
  Administered 2014-01-12 – 2014-01-14 (×7): 650 mg via ORAL
  Filled 2014-01-11 (×7): qty 2

## 2014-01-11 MED ORDER — POLYSACCHARIDE IRON COMPLEX 150 MG PO CAPS
150.0000 mg | ORAL_CAPSULE | Freq: Two times a day (BID) | ORAL | Status: DC
  Administered 2014-01-12 – 2014-01-17 (×10): 150 mg via ORAL
  Filled 2014-01-11 (×13): qty 1

## 2014-01-11 MED ORDER — HYDROXYZINE HCL 25 MG PO TABS: 50.0000 mg | ORAL_TABLET | Freq: Three times a day (TID) | ORAL | Status: DC | PRN

## 2014-01-11 MED ORDER — PIPERACILLIN-TAZOBACTAM 3.375 G IVPB 30 MIN
3.3750 g | Freq: Once | INTRAVENOUS | Status: AC
  Administered 2014-01-11: 3.375 g via INTRAVENOUS
  Filled 2014-01-11: qty 50

## 2014-01-11 MED ORDER — VANCOMYCIN HCL 10 G IV SOLR
2000.0000 mg | Freq: Once | INTRAVENOUS | Status: DC
  Filled 2014-01-11: qty 2000

## 2014-01-11 NOTE — H&P (Signed)
[facility] History and Physical  [name] NWG:[id number] DOB: 10-18-1945 DOA: 01/11/2014  Referring physician: EDP PCP: Shirline Frees, MD   Chief Complaint: LLE ulcer, fever   HPI: [name] is a 68 y.o. female with LLE venous stasis ulcer since 2011 presents to the ED after developing fever, chills, and feeling generally ill.  The ulcer is known to be infected with pseudomonas by culture.  She was prescribed a course of doxycycline by ID Dr. Johnnye Sima in an attempt to avoid IV abx treatment.  She finished the 2 week course on Sat, and then she began to feel ill.  Symptoms have been worsening since Saturday.  Fever was 102.5 today so she took tylenol and presented to the ED.  Review of Systems: Systems reviewed.  As above, otherwise negative  Past Medical History  Diagnosis Date  . Venous insufficiency   . Venous stasis ulcer   . Diabetes mellitus due to underlying condition with diabetic nephropathy   . Neuropathy   . DJD (degenerative joint disease)   . Anemia   . Nephropathy, diabetic    Past Surgical History  Procedure Laterality Date  . Tonsilectomy, adenoidectomy, bilateral myringotomy and tubes    . Mouth surgery     Social History:  reports that she has never smoked. She has never used smokeless tobacco. She reports that she does not drink alcohol or use illicit drugs.  Allergies  Allergen Reactions  . Bactrim [Sulfamethoxazole-Tmp Ds] Shortness Of Breath    Depression based reactions, altered mentality  . Atorvastatin Other (See Comments)    aches  . Sulfamethoxazole-Trimethoprim Swelling  . Actos [Pioglitazone Hydrochloride] Other (See Comments)    unknown  . Celebrex [Celecoxib] Other (See Comments) and Rash    unknown  . Ibuprofen Other (See Comments) and Rash  . Latex Rash  . Neosporin [Neomycin-Bacitracin Zn-Polymyx] Rash  . Pioglitazone Rash    Family History  Problem Relation Age of Onset  . Heart disease Mother   . Heart disease Father    . Diabetes Maternal Grandmother      Prior to Admission medications   Medication Sig Start Date End Date Taking? Authorizing Provider  acetic acid 0.25 % irrigation Irrigate with as directed daily.   Yes Historical Provider, MD  doxycycline (VIBRAMYCIN) 50 MG capsule Take 2 capsules (100 mg total) by mouth 2 (two) times daily. 01/07/14  Yes Campbell Riches, MD  glipiZIDE (GLUCOTROL) 10 MG tablet Take 10 mg by mouth 2 (two) times daily before a meal.   Yes Historical Provider, MD  hydrochlorothiazide 25 MG tablet Take 25 mg by mouth daily.    Yes Historical Provider, MD  hydrOXYzine (ATARAX/VISTARIL) 50 MG tablet Take 50 mg by mouth 3 (three) times daily as needed for itching.    Yes Historical Provider, MD  insulin NPH-insulin regular (NOVOLIN 70/30) (70-30) 100 UNIT/ML injection Inject 80 Units into the skin 2 (two) times daily with a meal.    Yes Historical Provider, MD  insulin regular (NOVOLIN R,HUMULIN R) 100 units/mL injection Inject 0-20 Units into the skin daily as needed for high blood sugar. Sliding scale   Yes Historical Provider, MD  iron polysaccharides (NIFEREX) 150 MG capsule Take 150 mg by mouth 2 (two) times daily.    Yes Historical Provider, MD  lactobacillus acidophilus (BACID) TABS Take 2 tablets by mouth daily after breakfast.     Yes Historical Provider, MD  latanoprost (XALATAN) 0.005 % ophthalmic solution Place 1 drop into both eyes at  bedtime. 12/30/13  Yes Historical Provider, MD  liver oil-zinc oxide (DESITIN) 40 % ointment Apply 1 application topically daily.   Yes Historical Provider, MD  mupirocin ointment (BACTROBAN) 2 % Apply 1 application topically daily. 11/14/13  Yes Historical Provider, MD  potassium chloride (K-DUR,KLOR-CON) 10 MEQ tablet Take 10 mEq by mouth 2 (two) times daily.   Yes Historical Provider, MD  timolol (TIMOPTIC) 0.5 % ophthalmic solution Place 1 drop into both eyes daily. 10/16/13  Yes Historical Provider, MD  traMADol (ULTRAM) 50 MG tablet Take  50 mg by mouth every 6 (six) hours as needed for moderate pain.    Yes Historical Provider, MD  vitamin C (ASCORBIC ACID) 500 MG tablet Take 500 mg by mouth 2 (two) times daily.    Yes Historical Provider, MD  bumetanide (BUMEX) 2 MG tablet Take 2 mg by mouth 2 (two) times daily.      Historical Provider, MD  COD LIVER OIL PO Take 1,000 mg by mouth daily.      Historical Provider, MD   Physical Exam: Filed Vitals:   01/11/14 2230  BP: 141/50  Pulse: 70  Temp:   Resp: 24    BP 141/50  Pulse 70  Temp(Src) 99.5 F (37.5 C) (Oral)  Resp 24  Ht 5' 6.93" (1.7 m)  Wt 140.615 kg (310 lb)  BMI 48.66 kg/m2  SpO2 99%  General Appearance:    Alert, oriented, no distress, appears stated age  Head:    Normocephalic, atraumatic  Eyes:    PERRL, EOMI, sclera non-icteric        Nose:   Nares without drainage or epistaxis. Mucosa, turbinates normal  Throat:   Moist mucous membranes. Oropharynx without erythema or exudate.  Neck:   Supple. No carotid bruits.  No thyromegaly.  No lymphadenopathy.   Back:     No CVA tenderness, no spinal tenderness  Lungs:     Clear to auscultation bilaterally, without wheezes, rhonchi or rales  Chest wall:    No tenderness to palpitation  Heart:    Regular rate and rhythm without murmurs, gallops, rubs  Abdomen:     Soft, non-tender, nondistended, normal bowel sounds, no organomegaly  Genitalia:    deferred  Rectal:    deferred  Extremities:   LLE ulcer is currently under dressing.  Pulses:   2+ and symmetric all extremities  Skin:   Skin color, texture, turgor normal, no rashes or lesions  Lymph nodes:   Cervical, supraclavicular, and axillary nodes normal  Neurologic:   CNII-XII intact. Normal strength, sensation and reflexes      throughout    Labs on Admission:  Basic Metabolic Panel:  Recent Labs Lab 01/11/14 1815  NA 139  K 4.6  CL 97  CO2 25  GLUCOSE 104*  BUN 10  CREATININE 0.98  CALCIUM 9.9   Liver Function Tests: No results found  for this basename: AST, ALT, ALKPHOS, BILITOT, PROT, ALBUMIN,  in the last 168 hours No results found for this basename: LIPASE, AMYLASE,  in the last 168 hours No results found for this basename: AMMONIA,  in the last 168 hours CBC:  Recent Labs Lab 01/11/14 1815  WBC 18.2*  NEUTROABS 17.0*  HGB 8.0*  HCT 23.3*  MCV 87.9  PLT 199   Cardiac Enzymes: No results found for this basename: CKTOTAL, CKMB, CKMBINDEX, TROPONINI,  in the last 168 hours  BNP (last 3 results) No results found for this basename: PROBNP,  in the last  8760 hours CBG:  Recent Labs Lab 01/11/14 2154  GLUCAP 158*    Radiological Exams on Admission: Dg Chest 2 View  01/11/2014   CLINICAL DATA:  Fever  EXAM: CHEST  2 VIEW  COMPARISON:  DG CHEST 2 VIEW dated 08/29/2011  FINDINGS: There is no focal parenchymal opacity, pleural effusion, or pneumothorax. There is stable cardiomegaly.  There is mild thoracic spine spondylosis.  IMPRESSION: No active cardiopulmonary disease.   Electronically Signed   By: Kathreen Devoid   On: 01/11/2014 21:57    EKG: Independently reviewed.  Assessment/Plan Principal Problem:   Venous stasis ulcer of left lower extremity Active Problems:   Type II or unspecified type diabetes mellitus with unspecified complication, uncontrolled   Sepsis   Ulcer of left lower leg   1. Venous stasis ulcer of the LLE - wound care consult, patient will need dressing change.  Have ordered zosyn given the known pseudomonas infection, patient got 1 dose of vanc in ED but will hold off on additional vanc for now. Her culture of pseudomonas is known to be resistant to Cipro and aztreonam (as per Dr. Algis Downs office note on 4/29).  Conveniently, Dr. Johnnye Sima himself would appear to be on consults for ID tomorrow.  Anticipate that day team will likely obtain routine consult with him for further management recommendations. 2. Sepsis - tylenol for fever, monitor leukocytosis, will gently hydrate by holding the  patients diuretic, however she does not appear volume depleted at this time so will hold off on additional IVF for now. 3. DM2 - will continue her home 70/30 as well as her home resistant dose SSI AC/HS.    Code Status: Full Code  Family Communication: Family at bedside Disposition Plan: Admit to inpatient   Time spent: 36 min  [facility] Pager [phone]  If 7AM-7PM, please contact the day team taking care of the patient [email or web address] Password [facility] 01/11/2014, 10:48 PM

## 2014-01-11 NOTE — ED Notes (Addendum)
Pt reports wound to left lower leg since 2011. Today had temp of 102.5, wound RN told to come here. Pt has pseudomonas to left lower leg. Is taking doxycycline but hasn't taken since Saturday morning which is when she started feeling bad. Took 2 tylenols today.

## 2014-01-11 NOTE — ED Provider Notes (Signed)
CSN: [id number]     Arrival date & time 01/11/14  1754 History   First MD Initiated Contact with Patient 01/11/14 2112     Chief Complaint  Patient presents with  . Fever      HPI Pt was seen at 2145. Per pt and her family, c/o gradual onset and persistence of intermittent home fevers and shaking chills since yesterday. Pt states she has hx chronic left lower leg ulcer since 2011, with recent pseudomonal infection. She has been taking PO doxycycline for the past 2 weeks. Pt states she took her last dose yesterday, then developed home fevers to "102.5." Pt states she "just feels bad" but cannot be more specific. Denies CP/palpitations, no SOB/cough, no abd pain, no N/V/D, no back pain.    ID: Johnnye Sima Past Medical History  Diagnosis Date  . Venous insufficiency   . Venous stasis ulcer   . Diabetes mellitus due to underlying condition with diabetic nephropathy   . Neuropathy   . DJD (degenerative joint disease)   . Anemia   . Nephropathy, diabetic    Past Surgical History  Procedure Laterality Date  . Tonsilectomy, adenoidectomy, bilateral myringotomy and tubes    . Mouth surgery     Family History  Problem Relation Age of Onset  . Heart disease Mother   . Heart disease Father   . Diabetes Maternal Grandmother    History  Substance Use Topics  . Smoking status: Never Smoker   . Smokeless tobacco: Never Used  . Alcohol Use: No    Review of Systems ROS: Statement: All systems negative except as marked or noted in the HPI; Constitutional: +fever and chills. ; ; Eyes: Negative for eye pain, redness and discharge. ; ; ENMT: Negative for ear pain, hoarseness, nasal congestion, sinus pressure and sore throat. ; ; Cardiovascular: Negative for chest pain, palpitations, diaphoresis, dyspnea and peripheral edema. ; ; Respiratory: Negative for cough, wheezing and stridor. ; ; Gastrointestinal: Negative for nausea, vomiting, diarrhea, abdominal pain, blood in stool, hematemesis, jaundice  and rectal bleeding. . ; ; Genitourinary: Negative for dysuria, flank pain and hematuria. ; ; Musculoskeletal: Negative for back pain and neck pain. Negative for swelling and trauma.; ; Skin: Negative for pruritus, abrasions, blisters, bruising and +LLE rash, ulcer.; ; Neuro: Negative for headache, lightheadedness and neck stiffness. Negative for weakness, altered level of consciousness , altered mental status, extremity weakness, paresthesias, involuntary movement, seizure and syncope.        Allergies  Bactrim; Atorvastatin; Sulfamethoxazole-trimethoprim; Actos; Celebrex; Ibuprofen; Latex; Neosporin; and Pioglitazone  Home Medications   Prior to Admission medications   Medication Sig Start Date End Date Taking? Authorizing Provider  acetic acid 0.25 % irrigation Irrigate with as directed daily.   Yes Historical Provider, MD  doxycycline (VIBRAMYCIN) 50 MG capsule Take 2 capsules (100 mg total) by mouth 2 (two) times daily. 01/07/14  Yes Campbell Riches, MD  glipiZIDE (GLUCOTROL) 10 MG tablet Take 10 mg by mouth 2 (two) times daily before a meal.   Yes Historical Provider, MD  hydrochlorothiazide 25 MG tablet Take 25 mg by mouth daily.    Yes Historical Provider, MD  hydrOXYzine (ATARAX/VISTARIL) 50 MG tablet Take 50 mg by mouth 3 (three) times daily as needed for itching.    Yes Historical Provider, MD  insulin NPH-insulin regular (NOVOLIN 70/30) (70-30) 100 UNIT/ML injection Inject 80 Units into the skin 2 (two) times daily with a meal.    Yes Historical Provider, MD  insulin regular (  NOVOLIN R,HUMULIN R) 100 units/mL injection Inject 0-20 Units into the skin daily as needed for high blood sugar. Sliding scale   Yes Historical Provider, MD  iron polysaccharides (NIFEREX) 150 MG capsule Take 150 mg by mouth 2 (two) times daily.    Yes Historical Provider, MD  lactobacillus acidophilus (BACID) TABS Take 2 tablets by mouth daily after breakfast.     Yes Historical Provider, MD  latanoprost  (XALATAN) 0.005 % ophthalmic solution Place 1 drop into both eyes at bedtime. 12/30/13  Yes Historical Provider, MD  liver oil-zinc oxide (DESITIN) 40 % ointment Apply 1 application topically daily.   Yes Historical Provider, MD  mupirocin ointment (BACTROBAN) 2 % Apply 1 application topically daily. 11/14/13  Yes Historical Provider, MD  potassium chloride (K-DUR,KLOR-CON) 10 MEQ tablet Take 10 mEq by mouth 2 (two) times daily.   Yes Historical Provider, MD  timolol (TIMOPTIC) 0.5 % ophthalmic solution Place 1 drop into both eyes daily. 10/16/13  Yes Historical Provider, MD  traMADol (ULTRAM) 50 MG tablet Take 50 mg by mouth every 6 (six) hours as needed for moderate pain.    Yes Historical Provider, MD  vitamin C (ASCORBIC ACID) 500 MG tablet Take 500 mg by mouth 2 (two) times daily.    Yes Historical Provider, MD  bumetanide (BUMEX) 2 MG tablet Take 2 mg by mouth 2 (two) times daily.      Historical Provider, MD  COD LIVER OIL PO Take 1,000 mg by mouth daily.      Historical Provider, MD   BP 129/39  Pulse 74  Temp(Src) 99.5 F (37.5 C) (Oral)  Resp 25  Ht 5' 6.93" (1.7 m)  Wt 310 lb (140.615 kg)  BMI 48.66 kg/m2  SpO2 100% Physical Exam 2150: Physical examination:  Nursing notes reviewed; Vital signs and O2 SAT reviewed;  Constitutional: Well developed, Well nourished, Well hydrated, In no acute distress; Head:  Normocephalic, atraumatic; Eyes: EOMI, PERRL, No scleral icterus; ENMT: Mouth and pharynx normal, Mucous membranes moist; Neck: Supple, Full range of motion, No lymphadenopathy; Cardiovascular: Regular rate and rhythm, No murmur, rub, or gallop; Respiratory: Breath sounds clear & equal bilaterally, No rales, rhonchi, wheezes.  Speaking full sentences with ease, Normal respiratory effort/excursion; Chest: Nontender, Movement normal; Abdomen: Soft, Nontender, Nondistended, Normal bowel sounds; Genitourinary: No CVA tenderness; Extremities: Pulses normal, No tenderness, +3 pedal edema bilat.  +left lower leg with large ulcerated wound, green drainage, surrounding erythema..; Neuro: AA&Ox3, Major CN grossly intact.  Speech clear. No gross focal motor or sensory deficits in extremities.; Skin: Color normal, Warm, Dry.   ED Course  Procedures     EKG Interpretation None      MDM  MDM Reviewed: previous chart, nursing note and vitals Reviewed previous: labs Interpretation: labs and x-ray   Results for orders placed during the hospital encounter of 01/11/14  CBC WITH DIFFERENTIAL      Result Value Ref Range   WBC 18.2 (*) 4.0 - 10.5 K/uL   RBC 2.65 (*) 3.87 - 5.11 MIL/uL   Hemoglobin 8.0 (*) 12.0 - 15.0 g/dL   HCT 23.3 (*) 36.0 - 46.0 %   MCV 87.9  78.0 - 100.0 fL   MCH 30.2  26.0 - 34.0 pg   MCHC 34.3  30.0 - 36.0 g/dL   RDW 17.1 (*) 11.5 - 15.5 %   Platelets 199  150 - 400 K/uL   Neutrophils Relative % 93 (*) 43 - 77 %   Neutro Abs 17.0 (*)  1.7 - 7.7 K/uL   Lymphocytes Relative 3 (*) 12 - 46 %   Lymphs Abs 0.5 (*) 0.7 - 4.0 K/uL   Monocytes Relative 4  3 - 12 %   Monocytes Absolute 0.8  0.1 - 1.0 K/uL   Eosinophils Relative 0  0 - 5 %   Eosinophils Absolute 0.0  0.0 - 0.7 K/uL   Basophils Relative 0  0 - 1 %   Basophils Absolute 0.0  0.0 - 0.1 K/uL  BASIC METABOLIC PANEL      Result Value Ref Range   Sodium 139  137 - 147 mEq/L   Potassium 4.6  3.7 - 5.3 mEq/L   Chloride 97  96 - 112 mEq/L   CO2 25  19 - 32 mEq/L   Glucose, Bld 104 (*) 70 - 99 mg/dL   BUN 10  6 - 23 mg/dL   Creatinine, Ser 0.98  0.50 - 1.10 mg/dL   Calcium 9.9  8.4 - 10.5 mg/dL   GFR calc non Af Amer 58 (*) >90 mL/min   GFR calc Af Amer 67 (*) >90 mL/min  I-STAT CG4 LACTIC ACID, ED      Result Value Ref Range   Lactic Acid, Venous 1.58  0.5 - 2.2 mmol/L  CBG MONITORING, ED      Result Value Ref Range   Glucose-Capillary 158 (*) 70 - 99 mg/dL   Dg Chest 2 View 01/11/2014   CLINICAL DATA:  Fever  EXAM: CHEST  2 VIEW  COMPARISON:  DG CHEST 2 VIEW dated 08/29/2011  FINDINGS: There is no  focal parenchymal opacity, pleural effusion, or pneumothorax. There is stable cardiomegaly.  There is mild thoracic spine spondylosis.  IMPRESSION: No active cardiopulmonary disease.   Electronically Signed   By: Kathreen Devoid   On: 01/11/2014 21:57    2225:  Pt with elevated WBC count and fever s/p 2 week course of abx for chronic left lower leg ulcer. Pt has hx pseudomonal infection in wound. Will start IV vancomycin and zosyn. Dx and testing d/w pt and family.  Questions answered.  Verb understanding, agreeable to admit. T/C to [facility] Dr. Alcario Drought, case discussed, including:  HPI, pertinent PM/SHx, VS/PE, dx testing, ED course and treatment:  Agreeable to admit, requests to write temporary orders, obtain medical bed to team 10.   Alfonzo Feller, DO 01/12/14 1512

## 2014-01-11 NOTE — ED Notes (Signed)
Blood cultures collected prior to antibiotic start time.

## 2014-01-11 NOTE — ED Notes (Signed)
Phlebotomy tech, whitney notified for blood culture.

## 2014-01-11 NOTE — ED Notes (Signed)
Pt reports she took 65 units of 7-/30 Novolog with the meal given to her.

## 2014-01-11 NOTE — ED Notes (Signed)
Admitting MD at bedside.

## 2014-01-11 NOTE — ED Notes (Signed)
MD at bedside. 

## 2014-01-12 ENCOUNTER — Encounter (HOSPITAL_COMMUNITY): Payer: Self-pay | Admitting: Physician Assistant

## 2014-01-12 ENCOUNTER — Other Ambulatory Visit: Payer: Self-pay | Admitting: Plastic Surgery

## 2014-01-12 DIAGNOSIS — L02419 Cutaneous abscess of limb, unspecified: Secondary | ICD-10-CM

## 2014-01-12 DIAGNOSIS — L03119 Cellulitis of unspecified part of limb: Secondary | ICD-10-CM

## 2014-01-12 LAB — CBC
HCT: 22.8 % — ABNORMAL LOW (ref 36.0–46.0)
HCT: 23.6 % — ABNORMAL LOW (ref 36.0–46.0)
HEMOGLOBIN: 7.6 g/dL — AB (ref 12.0–15.0)
Hemoglobin: 7.9 g/dL — ABNORMAL LOW (ref 12.0–15.0)
MCH: 29.5 pg (ref 26.0–34.0)
MCH: 29.5 pg (ref 26.0–34.0)
MCHC: 33.3 g/dL (ref 30.0–36.0)
MCHC: 33.5 g/dL (ref 30.0–36.0)
MCV: 88.4 fL (ref 78.0–100.0)
MCV: 88.1 fL (ref 78.0–100.0)
PLATELETS: 172 10*3/uL (ref 150–400)
Platelets: 170 10*3/uL (ref 150–400)
RBC: 2.58 MIL/uL — AB (ref 3.87–5.11)
RBC: 2.68 MIL/uL — AB (ref 3.87–5.11)
RDW: 17.3 % — ABNORMAL HIGH (ref 11.5–15.5)
RDW: 17.3 % — AB (ref 11.5–15.5)
WBC: 11.3 10*3/uL — AB (ref 4.0–10.5)
WBC: 11 10*3/uL — AB (ref 4.0–10.5)

## 2014-01-12 LAB — URINALYSIS, ROUTINE W REFLEX MICROSCOPIC
BILIRUBIN URINE: NEGATIVE
Glucose, UA: NEGATIVE mg/dL
Hgb urine dipstick: NEGATIVE
Ketones, ur: NEGATIVE mg/dL
NITRITE: NEGATIVE
PROTEIN: NEGATIVE mg/dL
SPECIFIC GRAVITY, URINE: 1.021 (ref 1.005–1.030)
UROBILINOGEN UA: 1 mg/dL (ref 0.0–1.0)
pH: 5 (ref 5.0–8.0)

## 2014-01-12 LAB — BASIC METABOLIC PANEL
BUN: 37 mg/dL — ABNORMAL HIGH (ref 6–23)
BUN: 38 mg/dL — ABNORMAL HIGH (ref 6–23)
CALCIUM: 8.8 mg/dL (ref 8.4–10.5)
CALCIUM: 9.2 mg/dL (ref 8.4–10.5)
CO2: 22 meq/L (ref 19–32)
CO2: 22 mEq/L (ref 19–32)
Chloride: 94 mEq/L — ABNORMAL LOW (ref 96–112)
Chloride: 95 mEq/L — ABNORMAL LOW (ref 96–112)
Creatinine, Ser: 1.93 mg/dL — ABNORMAL HIGH (ref 0.50–1.10)
Creatinine, Ser: 2.05 mg/dL — ABNORMAL HIGH (ref 0.50–1.10)
GFR calc non Af Amer: 25 mL/min — ABNORMAL LOW (ref >90)
GFR, EST AFRICAN AMERICAN: 29 mL/min — AB (ref >90)
GFR, EST AFRICAN AMERICAN: 27 mL/min — AB (ref >90)
GFR, EST NON AFRICAN AMERICAN: 24 mL/min — AB (ref >90)
GLUCOSE: 102 mg/dL — AB (ref 70–99)
Glucose, Bld: 138 mg/dL — ABNORMAL HIGH (ref 70–99)
Potassium: 3.1 mEq/L — ABNORMAL LOW (ref 3.7–5.3)
Potassium: 3 mEq/L — ABNORMAL LOW (ref 3.7–5.3)
SODIUM: 131 meq/L — AB (ref 137–147)
SODIUM: 134 meq/L — AB (ref 137–147)

## 2014-01-12 LAB — GLUCOSE, CAPILLARY
GLUCOSE-CAPILLARY: 133 mg/dL — AB (ref 70–99)
GLUCOSE-CAPILLARY: 137 mg/dL — AB (ref 70–99)
GLUCOSE-CAPILLARY: 98 mg/dL (ref 70–99)
GLUCOSE-CAPILLARY: 148 mg/dL — AB (ref 70–99)

## 2014-01-12 LAB — URINE MICROSCOPIC-ADD ON

## 2014-01-12 MED ORDER — VANCOMYCIN HCL 10 G IV SOLR
2000.0000 mg | INTRAVENOUS | Status: DC
  Administered 2014-01-12 – 2014-01-14 (×3): 2000 mg via INTRAVENOUS
  Filled 2014-01-12 (×4): qty 2000

## 2014-01-12 MED ORDER — MUPIROCIN 2 % EX OINT
TOPICAL_OINTMENT | CUTANEOUS | Status: AC
  Filled 2014-01-12: qty 22

## 2014-01-12 MED ORDER — POTASSIUM CHLORIDE IN NACL 20-0.9 MEQ/L-% IV SOLN
INTRAVENOUS | Status: AC
  Administered 2014-01-12 – 2014-01-13 (×2): via INTRAVENOUS
  Filled 2014-01-12 (×3): qty 1000

## 2014-01-12 MED ORDER — RISAQUAD PO CAPS
2.0000 | ORAL_CAPSULE | Freq: Every day | ORAL | Status: DC
  Administered 2014-01-12 – 2014-01-13 (×2): 2 via ORAL
  Filled 2014-01-12 (×2): qty 2

## 2014-01-12 MED ORDER — INSULIN ASPART 100 UNIT/ML ~~LOC~~ SOLN
0.0000 [IU] | Freq: Three times a day (TID) | SUBCUTANEOUS | Status: DC
  Administered 2014-01-13: 2 [IU] via SUBCUTANEOUS
  Administered 2014-01-13: 3 [IU] via SUBCUTANEOUS
  Administered 2014-01-14: 1 [IU] via SUBCUTANEOUS
  Administered 2014-01-15: 3 [IU] via SUBCUTANEOUS
  Administered 2014-01-15 (×2): 2 [IU] via SUBCUTANEOUS

## 2014-01-12 NOTE — Consult Note (Addendum)
WOC wound consult note Reason for Consult:Consult requested for left outer leg wound.  Pt is followed as an outpatient by [facility] wound clinic and they have ordered acetic acid and silver dressing and 3 layers or Profore wrap which is changed daily, according to patient.  The brands of topical treatment that are being used are not available in the [place].  Plan to substitute Aquacel and then apply 3 layers of Profore.  Primary team in to assess wound; they request that it not be wrapped until ID team available later to assess wound.   Wound type: Chronic full thickness stasis ulcer. Measurement: 10X12X.5cm Wound bed: 50% yellow slough, 50% red Drainage (amount, consistency, odor) LARGE AMT bright green drainage; strong odor consistent with Psudomonias. Periwound: Wound edges macerated from constant moisture; applied zinc oxide cream to protect surrounding skin. Dressing procedure/placement/frequency: Applied hydrogel and ABD pad until ID team can assess site, then will plan to return to apply dressing. Julien Girt MSN, RN, [place], [place], [place]

## 2014-01-12 NOTE — Progress Notes (Signed)
INFECTIOUS DISEASE PROGRESS NOTE  ID: [name] is a 68 y.o. female with  Principal Problem:   Venous stasis ulcer of left lower extremity Active Problems:   Type II or unspecified type diabetes mellitus with unspecified complication, uncontrolled   Sepsis   Ulcer of left lower leg  Subjective: 68 yo F with hx of DM2 (20 yrs), CRI (sees Dr Clover Mealy), and LE ulcers since 1996. She has had a LLE ulcer since 2011. She had Cx done during debridement 11-30-13 showing 2+ P aeruginosa (R- aztreonam and cipro) and 1+ CNS.  Has been followed by [facility] who comes to her house. Her wound was improving until she got psuedomonas, "it turned totally green'. Has been putting water:vinegar, zinc oxide and santyl on wound. Had deep debridement in April. Has been on no anbx until 12-23-13 when she was seen in ID. She was started on doxy (trying to avoid IV).She was seen in ID f/u on 4-29 and appeared to be doing well.  By 5-2 she had developed chills. She stopped her anbx and by 5-3 she had fever and came to hospital.  She was started on vanco/zosyn. She has been eval by wound nurses.   ROS: has been eating well now in hospital, constipated prior, no dysuria. See HPI.   Abtx:  Anti-infectives   Start     Dose/Rate Route Frequency Ordered Stop   01/12/14 0600  piperacillin-tazobactam (ZOSYN) IVPB 3.375 g     3.375 g 12.5 mL/hr over 240 Minutes Intravenous 3 times per day 01/11/14 2344     01/11/14 2230  vancomycin (VANCOCIN) 2,000 mg in sodium chloride 0.9 % 500 mL IVPB  Status:  Discontinued     2,000 mg 250 mL/hr over 120 Minutes Intravenous  Once 01/11/14 2224 01/11/14 2247   01/11/14 2230  piperacillin-tazobactam (ZOSYN) IVPB 3.375 g     3.375 g 100 mL/hr over 30 Minutes Intravenous  Once 01/11/14 2224 01/11/14 2340      Medications:  Scheduled: . acetic acid   Irrigation Daily  . acidophilus  2 capsule Oral Daily  . glipiZIDE  10 mg Oral BID AC  . heparin  5,000 Units  Subcutaneous 3 times per day  . hydrochlorothiazide  25 mg Oral Daily  . insulin aspart  0-20 Units Subcutaneous TID WC  . insulin aspart protamine- aspart  80 Units Subcutaneous BID WC  . iron polysaccharides  150 mg Oral BID  . latanoprost  1 drop Both Eyes QHS  . mupirocin ointment  1 application Topical Daily  . piperacillin-tazobactam (ZOSYN)  IV  3.375 g Intravenous 3 times per day  . timolol  1 drop Both Eyes Daily    Objective: Vital signs in last 24 hours: Temp:  [98.4 F (36.9 C)-99.7 F (37.6 C)] 98.7 F (37.1 C) (05/04 0600) Pulse Rate:  [66-77] 69 (05/04 0600) Resp:  [20-28] 20 (05/04 0600) BP: (121-141)/(37-59) 121/59 mmHg (05/04 0600) SpO2:  [97 %-100 %] 100 % (05/04 0600) Weight:  [140.615 kg (310 lb)-142.575 kg (314 lb 5.1 oz)] 142.575 kg (314 lb 5.1 oz) (05/04 0038)   General appearance: alert, cooperative and no distress Eyes: negative findings: conjunctivae and sclerae normal and pupils equal, round, reactive to light and accomodation Throat: normal findings: oropharynx pink & moist without lesions or evidence of thrush Resp: clear to auscultation bilaterally Cardio: regular rate and rhythm GI: normal findings: bowel sounds normal and soft, non-tender Extremities: LLE- lympedema. hot. erythema around wound. wound is large,  deep, traingular. no d/c is noted. non-tender.   Lab Results  Recent Labs  01/11/14 1815 01/12/14 0530 01/12/14 0805  WBC 18.2* 11.3* 11.0*  HGB 8.0* 7.6* 7.9*  HCT 23.3* 22.8* 23.6*  NA 139  --  131*  K 4.6  --  3.1*  CL 97  --  94*  CO2 25  --  22  BUN 10  --  37*  CREATININE 0.98  --  1.93*   Liver Panel No results found for this basename: PROT, ALBUMIN, AST, ALT, ALKPHOS, BILITOT, BILIDIR, IBILI,  in the last 72 hours Sedimentation Rate No results found for this basename: ESRSEDRATE,  in the last 72 hours C-Reactive Protein No results found for this basename: CRP,  in the last 72 hours  Microbiology: No results found  for this or any previous visit (from the past 240 hour(s)).  Studies/Results: Dg Chest 2 View  01/11/2014   CLINICAL DATA:  Fever  EXAM: CHEST  2 VIEW  COMPARISON:  DG CHEST 2 VIEW dated 08/29/2011  FINDINGS: There is no focal parenchymal opacity, pleural effusion, or pneumothorax. There is stable cardiomegaly.  There is mild thoracic spine spondylosis.  IMPRESSION: No active cardiopulmonary disease.   Electronically Signed   By: Kathreen Devoid   On: 01/11/2014 21:57     Assessment/Plan: Cellulitis  Total days of antibiotics: 2 (zosyn)      Would: Have Dr Migdalia Dk see her (or other wound MD) Wall IV anbx when improving ? VAC  Comment- I spoke with Dr Zigmund Daniel (sp?) her wound MD in [place] this AM. There was a previous miscommunication about this pt receiving a VAC. He would like her seen in [place] here in [place]. I asked her to consider going home with IV anbx, she previously wished to avoid this. No more doxy.      Campbell Riches Infectious Diseases (pager) [phone] [email or web address] 01/12/2014, 1:43 PM  LOS: 1 day   **Disclaimer: This note may have been dictated with voice recognition software. Similar sounding words can inadvertently be transcribed and this note may contain transcription errors which may not have been corrected upon publication of note.**

## 2014-01-12 NOTE — Consult Note (Signed)
Reason for Consult:chronic left lower leg ulceration Referring Physician: Dr. Ebbie Latus [name] is an 68 y.o. female.  HPI: [name] is a pleasant 68 yo female admitted with cellulitis of her left lower leg. She has had this wound since at least 2011. She has most recently been followed by Dr. Zigmund Daniel at [facility] and underwent debridement of the area within the past couple of weeks with cultures growing MDR Pseudomonas. A trial of oral Doxycycline was started, but she developed cellulitis with systemic illness and was admitted for IV antibiotics, Zosyn. She has been followed by Dr. Johnnye Sima for ID and she is willing to have IV antibiotics at home now.  She has diabetes, HTN, morbid obesity and chronic venous insufficiency.   The wound today has moderate to large amount of  green appearing drainage and 50 % slough and necrosis within the wound bed. The wound measures approximately 10 x 11 x 5 cm.  We are consulted for assistance with wound management.   Past Medical History  Diagnosis Date  . Venous insufficiency   . Venous stasis ulcer   . Diabetes mellitus due to underlying condition with diabetic nephropathy   . Neuropathy   . DJD (degenerative joint disease)   . Anemia   . Nephropathy, diabetic     Past Surgical History  Procedure Laterality Date  . Tonsilectomy, adenoidectomy, bilateral myringotomy and tubes    . Mouth surgery      Family History  Problem Relation Age of Onset  . Heart disease Mother   . Heart disease Father   . Diabetes Maternal Grandmother     Social History:  reports that she has never smoked. She has never used smokeless tobacco. She reports that she does not drink alcohol or use illicit drugs.  Allergies:  Allergies  Allergen Reactions  . Bactrim [Sulfamethoxazole-Tmp Ds] Shortness Of Breath    Depression based reactions, altered mentality  . Atorvastatin Other (See Comments)    aches  . Sulfamethoxazole-Trimethoprim Swelling  . Actos  [Pioglitazone Hydrochloride] Other (See Comments)    unknown  . Celebrex [Celecoxib] Other (See Comments) and Rash    unknown  . Ibuprofen Other (See Comments) and Rash  . Latex Rash  . Neosporin [Neomycin-Bacitracin Zn-Polymyx] Rash  . Pioglitazone Rash    Medications: I have reviewed the patient's current medications.  Results for orders placed during the hospital encounter of 01/11/14 (from the past 48 hour(s))  CBC WITH DIFFERENTIAL     Status: Abnormal   Collection Time    01/11/14  6:15 PM      Result Value Ref Range   WBC 18.2 (*) 4.0 - 10.5 K/uL   RBC 2.65 (*) 3.87 - 5.11 MIL/uL   Hemoglobin 8.0 (*) 12.0 - 15.0 g/dL   HCT 23.3 (*) 36.0 - 46.0 %   MCV 87.9  78.0 - 100.0 fL   MCH 30.2  26.0 - 34.0 pg   MCHC 34.3  30.0 - 36.0 g/dL   RDW 17.1 (*) 11.5 - 15.5 %   Platelets 199  150 - 400 K/uL   Neutrophils Relative % 93 (*) 43 - 77 %   Neutro Abs 17.0 (*) 1.7 - 7.7 K/uL   Lymphocytes Relative 3 (*) 12 - 46 %   Lymphs Abs 0.5 (*) 0.7 - 4.0 K/uL   Monocytes Relative 4  3 - 12 %   Monocytes Absolute 0.8  0.1 - 1.0 K/uL   Eosinophils Relative 0  0 - 5 %  Eosinophils Absolute 0.0  0.0 - 0.7 K/uL   Basophils Relative 0  0 - 1 %   Basophils Absolute 0.0  0.0 - 0.1 K/uL  BASIC METABOLIC PANEL     Status: Abnormal   Collection Time    01/11/14  6:15 PM      Result Value Ref Range   Sodium 139  137 - 147 mEq/L   Potassium 4.6  3.7 - 5.3 mEq/L   Chloride 97  96 - 112 mEq/L   CO2 25  19 - 32 mEq/L   Glucose, Bld 104 (*) 70 - 99 mg/dL   BUN 10  6 - 23 mg/dL   Creatinine, Ser 0.98  0.50 - 1.10 mg/dL   Calcium 9.9  8.4 - 10.5 mg/dL   GFR calc non Af Amer 58 (*) >90 mL/min   GFR calc Af Amer 67 (*) >90 mL/min   Comment: (NOTE)     The eGFR has been calculated using the CKD EPI equation.     This calculation has not been validated in all clinical situations.     eGFR's persistently <90 mL/min signify possible Chronic Kidney     Disease.  I-STAT CG4 LACTIC ACID, ED      Status: None   Collection Time    01/11/14  6:23 PM      Result Value Ref Range   Lactic Acid, Venous 1.58  0.5 - 2.2 mmol/L  CBG MONITORING, ED     Status: Abnormal   Collection Time    01/11/14  9:54 PM      Result Value Ref Range   Glucose-Capillary 158 (*) 70 - 99 mg/dL  URINALYSIS, ROUTINE W REFLEX MICROSCOPIC     Status: Abnormal   Collection Time    01/12/14 12:54 AM      Result Value Ref Range   Color, Urine YELLOW  YELLOW   APPearance CLEAR  CLEAR   Specific Gravity, Urine 1.021  1.005 - 1.030   pH 5.0  5.0 - 8.0   Glucose, UA NEGATIVE  NEGATIVE mg/dL   Hgb urine dipstick NEGATIVE  NEGATIVE   Bilirubin Urine NEGATIVE  NEGATIVE   Ketones, ur NEGATIVE  NEGATIVE mg/dL   Protein, ur NEGATIVE  NEGATIVE mg/dL   Urobilinogen, UA 1.0  0.0 - 1.0 mg/dL   Nitrite NEGATIVE  NEGATIVE   Leukocytes, UA MODERATE (*) NEGATIVE  URINE MICROSCOPIC-ADD ON     Status: None   Collection Time    01/12/14 12:54 AM      Result Value Ref Range   Squamous Epithelial / LPF RARE  RARE   WBC, UA 7-10  <3 WBC/hpf   RBC / HPF 0-2  <3 RBC/hpf   Bacteria, UA RARE  RARE  CBC     Status: Abnormal   Collection Time    01/12/14  5:30 AM      Result Value Ref Range   WBC 11.3 (*) 4.0 - 10.5 K/uL   RBC 2.58 (*) 3.87 - 5.11 MIL/uL   Hemoglobin 7.6 (*) 12.0 - 15.0 g/dL   HCT 22.8 (*) 36.0 - 46.0 %   MCV 88.4  78.0 - 100.0 fL   MCH 29.5  26.0 - 34.0 pg   MCHC 33.3  30.0 - 36.0 g/dL   RDW 17.3 (*) 11.5 - 15.5 %   Platelets 172  150 - 400 K/uL  GLUCOSE, CAPILLARY     Status: Abnormal   Collection Time    01/12/14  8:03 AM  Result Value Ref Range   Glucose-Capillary 133 (*) 70 - 99 mg/dL  BASIC METABOLIC PANEL     Status: Abnormal   Collection Time    01/12/14  8:05 AM      Result Value Ref Range   Sodium 131 (*) 137 - 147 mEq/L   Comment: RESULTS VERIFIED VIA RECOLLECT   Potassium 3.1 (*) 3.7 - 5.3 mEq/L   Comment: RESULTS VERIFIED VIA RECOLLECT   Chloride 94 (*) 96 - 112 mEq/L   CO2 22   19 - 32 mEq/L   Glucose, Bld 138 (*) 70 - 99 mg/dL   BUN 37 (*) 6 - 23 mg/dL   Comment: RESULTS VERIFIED VIA RECOLLECT   Creatinine, Ser 1.93 (*) 0.50 - 1.10 mg/dL   Comment: RESULTS VERIFIED VIA RECOLLECT   Calcium 8.8  8.4 - 10.5 mg/dL   GFR calc non Af Amer 25 (*) >90 mL/min   GFR calc Af Amer 29 (*) >90 mL/min   Comment: (NOTE)     The eGFR has been calculated using the CKD EPI equation.     This calculation has not been validated in all clinical situations.     eGFR's persistently <90 mL/min signify possible Chronic Kidney     Disease.  CBC     Status: Abnormal   Collection Time    01/12/14  8:05 AM      Result Value Ref Range   WBC 11.0 (*) 4.0 - 10.5 K/uL   RBC 2.68 (*) 3.87 - 5.11 MIL/uL   Hemoglobin 7.9 (*) 12.0 - 15.0 g/dL   HCT 23.6 (*) 36.0 - 46.0 %   MCV 88.1  78.0 - 100.0 fL   MCH 29.5  26.0 - 34.0 pg   MCHC 33.5  30.0 - 36.0 g/dL   RDW 17.3 (*) 11.5 - 15.5 %   Platelets 170  150 - 400 K/uL  GLUCOSE, CAPILLARY     Status: Abnormal   Collection Time    01/12/14 12:24 PM      Result Value Ref Range   Glucose-Capillary 137 (*) 70 - 99 mg/dL  BASIC METABOLIC PANEL     Status: Abnormal   Collection Time    01/12/14  3:38 PM      Result Value Ref Range   Sodium 134 (*) 137 - 147 mEq/L   Potassium 3.0 (*) 3.7 - 5.3 mEq/L   Chloride 95 (*) 96 - 112 mEq/L   CO2 22  19 - 32 mEq/L   Glucose, Bld 102 (*) 70 - 99 mg/dL   BUN 38 (*) 6 - 23 mg/dL   Creatinine, Ser 2.05 (*) 0.50 - 1.10 mg/dL   Calcium 9.2  8.4 - 10.5 mg/dL   GFR calc non Af Amer 24 (*) >90 mL/min   GFR calc Af Amer 27 (*) >90 mL/min   Comment: (NOTE)     The eGFR has been calculated using the CKD EPI equation.     This calculation has not been validated in all clinical situations.     eGFR's persistently <90 mL/min signify possible Chronic Kidney     Disease.    Dg Chest 2 View  01/11/2014   CLINICAL DATA:  Fever  EXAM: CHEST  2 VIEW  COMPARISON:  DG CHEST 2 VIEW dated 08/29/2011  FINDINGS: There is  no focal parenchymal opacity, pleural effusion, or pneumothorax. There is stable cardiomegaly.  There is mild thoracic spine spondylosis.  IMPRESSION: No active cardiopulmonary disease.   Electronically Signed  By: Kathreen Devoid   On: 01/11/2014 21:57    Review of Systems  Constitutional: Positive for fever, chills, malaise/fatigue and diaphoresis.  Skin: Positive for itching (bilateral legs due to venous stasis).  Neurological: Positive for weakness.   Blood pressure 113/61, pulse 63, temperature 98.4 F (36.9 C), temperature source Oral, resp. rate 18, height 5' 7" (1.702 m), weight 142.575 kg (314 lb 5.1 oz), SpO2 96.00%. Physical Exam  Constitutional:  Obese female who is sitting up in recliner in NAD.  She is somewhat somnolent, but appropriate with responses and pleasant.   HENT:  Head: Normocephalic and atraumatic.  Nose: Nose normal.  Mouth/Throat: Oropharynx is clear and moist.  Neck: Normal range of motion. Neck supple. No tracheal deviation present.  Cardiovascular: Normal rate, regular rhythm, normal heart sounds and intact distal pulses.   Respiratory: Effort normal. No stridor. No respiratory distress. She has no wheezes.  GI: Soft. She exhibits no distension. There is no tenderness.  Musculoskeletal: She exhibits edema (bilateral lower extremities with edema).  Neurological: No cranial nerve deficit.  Skin: Skin is warm.  Chronic stasis changes of the lower extremities with the left lower extremity wound as noted above  Psychiatric: She has a normal mood and affect.    Assessment/Plan: Chronic venous stasis ulcer of left lower leg now with Pseudomonas infection- Discussed with  Dr. Migdalia Dk-  Patient would benefit from debridement and placement of Acell and VAC dressing in the OR. Will plan for later this week, likely Wed or Thursday. She could go home on the [facility] and follow up in [facility] locally with Dr. Migdalia Dk.  Will work on arranging OR for later this week.    RAYBURN,SHAWN,PA-C Plastic Surgery [phone]

## 2014-01-12 NOTE — Progress Notes (Addendum)
PROGRESS NOTE    [name] YPP:[id number] DOB: [birth year] DOA: 01/11/2014 PCP: Shirline Frees, MD  HPI/Brief narrative 68 year old female with history of DM 2, chronic kidney disease, chronic lower extremity venous stasis ulcers for which she follows with [facility], home health RN for dressing changes and [facility]. Her left leg wound had significantly improved until she got Pseudomonas infection and it worsened. She was started on oral doxycycline for/14/15 and was doing well during followup on 4/29. She then developed chills, stopped her antibiotics and had fevers. Admitted to hospital for further management.     Assessment/Plan:  1. Sepsis secondary to Left leg infected chronic venous stasis ulcer/cellulitis: Infectious disease consulted. They recommend consult by wound M.D., PICC line and home on IV antibiotics when improving. Dr. Johnnye Sima discussed with her M.D. in [place] who recommended [facility] in [place]. 2. DM 2: Continue home 70/30 insulin. DC to oral medications due to risk for hypoglycemia. Add NovoLog SSI. 3. Acute on chronic kidney disease: Baseline creatinine not known. Presented with creatinine of 0.9 on 5/3 in no creatinine at 1.93-? Lab error-Will repeat. Hold HCTZ. 4. Anemia: Likely secondary to chronic kidney disease. Stable. Transfuse if hemoglobin less than 7 g per DL.   Code Status: Full Family Communication: None at bedside Disposition Plan: Remains inpatient   Consultants:  Infectious disease  Procedures:  None  Antibiotics:  IV Zosyn 5/3 >    IV vancomycin 5/3 >  Subjective: No new complaints. No significant pain and left leg wound.  Objective: Filed Vitals:   01/11/14 2330 01/12/14 0038 01/12/14 0600 01/12/14 1423  BP: 131/50 140/47 121/59 113/61  Pulse: 75 74 69 63  Temp:  98.4 F (36.9 C) 98.7 F (37.1 C) 98.4 F (36.9 C)  TempSrc:  Oral  Oral  Resp: 24 22 20 18   Height:  5\' 7"  (1.702 m)    Weight:  142.575 kg (314 lb 5.1 oz)     SpO2: 100% 100% 100% 96%    Intake/Output Summary (Last 24 hours) at 01/12/14 1437 Last data filed at 01/11/14 2341  Gross per 24 hour  Intake     50 ml  Output      0 ml  Net     50 ml   Filed Weights   01/11/14 1759 01/12/14 0038  Weight: 140.615 kg (310 lb) 142.575 kg (314 lb 5.1 oz)     Exam:  General exam: Pleasant middle-age female sitting up comfortably in bed.  Respiratory system: Clear. No increased work of breathing. Cardiovascular system: S1 & S2 heard, RRR. No JVD, murmurs, gallops, clicks or pedal edema. Gastrointestinal system: Abdomen is nondistended, soft and nontender. Normal bowel sounds heard. Central nervous system: Alert and oriented. No focal neurological deficits. Extremities: Symmetric 5 x 5 power.Removed left leg wrap: Chronic lymphedema, large wound anterior lateral aspect of left lower leg with greenish drainage soaking overlying the dressing. Surrounding skin is warm and mildly erythematous.   Data Reviewed: Basic Metabolic Panel:  Recent Labs Lab 01/11/14 1815 01/12/14 0805  NA 139 131*  K 4.6 3.1*  CL 97 94*  CO2 25 22  GLUCOSE 104* 138*  BUN 10 37*  CREATININE 0.98 1.93*  CALCIUM 9.9 8.8   Liver Function Tests: No results found for this basename: AST, ALT, ALKPHOS, BILITOT, PROT, ALBUMIN,  in the last 168 hours No results found for this basename: LIPASE, AMYLASE,  in the last 168 hours No results found for this basename: AMMONIA,  in the last  168 hours CBC:  Recent Labs Lab 01/11/14 1815 01/12/14 0530 01/12/14 0805  WBC 18.2* 11.3* 11.0*  NEUTROABS 17.0*  --   --   HGB 8.0* 7.6* 7.9*  HCT 23.3* 22.8* 23.6*  MCV 87.9 88.4 88.1  PLT 199 172 170   Cardiac Enzymes: No results found for this basename: CKTOTAL, CKMB, CKMBINDEX, TROPONINI,  in the last 168 hours BNP (last 3 results) No results found for this basename: PROBNP,  in the last 8760 hours CBG:  Recent Labs Lab 01/11/14 2154 01/12/14 0803 01/12/14 1224  GLUCAP  158* 133* 137*    No results found for this or any previous visit (from the past 240 hour(s)).       Studies: Dg Chest 2 View  01/11/2014   CLINICAL DATA:  Fever  EXAM: CHEST  2 VIEW  COMPARISON:  DG CHEST 2 VIEW dated 08/29/2011  FINDINGS: There is no focal parenchymal opacity, pleural effusion, or pneumothorax. There is stable cardiomegaly.  There is mild thoracic spine spondylosis.  IMPRESSION: No active cardiopulmonary disease.   Electronically Signed   By: Kathreen Devoid   On: 01/11/2014 21:57        Scheduled Meds: . acetic acid   Irrigation Daily  . acidophilus  2 capsule Oral Daily  . glipiZIDE  10 mg Oral BID AC  . heparin  5,000 Units Subcutaneous 3 times per day  . hydrochlorothiazide  25 mg Oral Daily  . insulin aspart  0-20 Units Subcutaneous TID WC  . insulin aspart protamine- aspart  80 Units Subcutaneous BID WC  . iron polysaccharides  150 mg Oral BID  . latanoprost  1 drop Both Eyes QHS  . mupirocin ointment  1 application Topical Daily  . piperacillin-tazobactam (ZOSYN)  IV  3.375 g Intravenous 3 times per day  . timolol  1 drop Both Eyes Daily  . vancomycin  2,000 mg Intravenous Q24H   Continuous Infusions:   Principal Problem:   Venous stasis ulcer of left lower extremity Active Problems:   Type II or unspecified type diabetes mellitus with unspecified complication, uncontrolled   Sepsis   Ulcer of left lower leg    Time spent: 25 minutes.    Modena Jansky, MD, FACP, [facility]. [facility] Pager [phone]  If 7PM-7AM, please contact night-coverage [email or web address] Password [email or web address] 01/12/2014, 2:37 PM    LOS: 1 day

## 2014-01-12 NOTE — Consult Note (Addendum)
WOC follow-up: Plastics team in to assess left leg. ID was also in earlier to assess wound. Plastics plan to follow for possible surgery this week.  Applied acetic acid soak, then Aquacel and 3 layers of Profore as previous plan of care ordered by [facility] outpatient wound care center.  Pt tolerated with minimal discomfort.  Plan to re-apply dressing and compression wrap tomorrow after further  assessment and discussion of plan of care is completed by plastics team. Julien Girt MSN, RN, [place], [place], [place]

## 2014-01-12 NOTE — Progress Notes (Signed)
ANTIBIOTIC CONSULT NOTE - INITIAL  Pharmacy Consult for vancomycin Indication: cellulitis  Allergies  Allergen Reactions  . Bactrim [Sulfamethoxazole-Tmp Ds] Shortness Of Breath    Depression based reactions, altered mentality  . Atorvastatin Other (See Comments)    aches  . Sulfamethoxazole-Trimethoprim Swelling  . Actos [Pioglitazone Hydrochloride] Other (See Comments)    unknown  . Celebrex [Celecoxib] Other (See Comments) and Rash    unknown  . Ibuprofen Other (See Comments) and Rash  . Latex Rash  . Neosporin [Neomycin-Bacitracin Zn-Polymyx] Rash  . Pioglitazone Rash    Patient Measurements: Height: 5\' 7"  (170.2 cm) Weight: 314 lb 5.1 oz (142.575 kg) IBW/kg (Calculated) : 61.6   Vital Signs: Temp: 98.7 F (37.1 C) (05/04 0600) BP: 121/59 mmHg (05/04 0600) Pulse Rate: 69 (05/04 0600) Intake/Output from previous day: 05/03 0701 - 05/04 0700 In: 50 [I.V.:50] Out: -  Intake/Output from this shift:    Labs:  Recent Labs  01/11/14 1815 01/12/14 0530 01/12/14 0805  WBC 18.2* 11.3* 11.0*  HGB 8.0* 7.6* 7.9*  PLT 199 172 170  CREATININE 0.98  --  1.93*   Estimated Creatinine Clearance: 40.8 ml/min (by C-G formula based on Cr of 1.93). No results found for this basename: VANCOTROUGH, VANCOPEAK, VANCORANDOM, GENTTROUGH, GENTPEAK, GENTRANDOM, TOBRATROUGH, TOBRAPEAK, TOBRARND, AMIKACINPEAK, AMIKACINTROU, AMIKACIN,  in the last 72 hours   Microbiology: No results found for this or any previous visit (from the past 720 hour(s)).  Medical History: Past Medical History  Diagnosis Date  . Venous insufficiency   . Venous stasis ulcer   . Diabetes mellitus due to underlying condition with diabetic nephropathy   . Neuropathy   . DJD (degenerative joint disease)   . Anemia   . Nephropathy, diabetic     Medications:  Prescriptions prior to admission  Medication Sig Dispense Refill  . acetic acid 0.25 % irrigation Irrigate with as directed daily.      Marland Kitchen  doxycycline (VIBRAMYCIN) 50 MG capsule Take 2 capsules (100 mg total) by mouth 2 (two) times daily.  56 capsule  3  . glipiZIDE (GLUCOTROL) 10 MG tablet Take 10 mg by mouth 2 (two) times daily before a meal.      . hydrochlorothiazide 25 MG tablet Take 25 mg by mouth daily.       . hydrOXYzine (ATARAX/VISTARIL) 50 MG tablet Take 50 mg by mouth 3 (three) times daily as needed for itching.       . insulin NPH-insulin regular (NOVOLIN 70/30) (70-30) 100 UNIT/ML injection Inject 80 Units into the skin 2 (two) times daily with a meal.       . insulin regular (NOVOLIN R,HUMULIN R) 100 units/mL injection Inject 0-20 Units into the skin daily as needed for high blood sugar. Sliding scale      . iron polysaccharides (NIFEREX) 150 MG capsule Take 150 mg by mouth 2 (two) times daily.       Marland Kitchen lactobacillus acidophilus (BACID) TABS Take 2 tablets by mouth daily after breakfast.        . latanoprost (XALATAN) 0.005 % ophthalmic solution Place 1 drop into both eyes at bedtime.      Marland Kitchen liver oil-zinc oxide (DESITIN) 40 % ointment Apply 1 application topically daily.      . mupirocin ointment (BACTROBAN) 2 % Apply 1 application topically daily.      . potassium chloride (K-DUR,KLOR-CON) 10 MEQ tablet Take 10 mEq by mouth 2 (two) times daily.      . timolol (TIMOPTIC) 0.5 %  ophthalmic solution Place 1 drop into both eyes daily.      . traMADol (ULTRAM) 50 MG tablet Take 50 mg by mouth every 6 (six) hours as needed for moderate pain.       . vitamin C (ASCORBIC ACID) 500 MG tablet Take 500 mg by mouth 2 (two) times daily.       . bumetanide (BUMEX) 2 MG tablet Take 2 mg by mouth 2 (two) times daily.        . COD LIVER OIL PO Take 1,000 mg by mouth daily.         Assessment: 68 yo obese F to start vancomycin per pharmacy consult for cellulitis.   She has a venous stasis ulcer of LLE and uncontrolled DM, CRI.  She has had LLE ulcer since 2011. She had Cx done during debridement 11-30-13 showing 2+ P aeruginosa (R-  aztreonam and cipro) and 1+ CNS. Has been on no anbx until 12-23-13 when she was seen in ID. She was started on doxy (trying to avoid IV).She was seen in ID f/u on 4-29 and appeared to be doing well.  By 5-2 she had developed chills. She stopped her anbx and by 5-3 she had fever and came to hospital. She was started on zosyn 5/3 and vanc added 5/4 Wt 143 kg. WBC 18.2>11.3>11.   Creat 1.93; tmax 99.7.  Plan to place PICC for home abx once improving.    Zosyn 5/3>> vanco 5/4>>  5/4 Ucx >> 5/3 BC x 2 >>   Goal of Therapy:  Vancomycin trough level 10-15 mcg/ml  Plan:  1. Vancomycin 2 gm IV q24 2. F/u renal fxn, wbc, temp, culture data, clinical course 3. Steady-state vancomycin trough as needed  Eudelia Bunch, Pharm.D. [phone] 01/12/2014 2:01 PM

## 2014-01-12 NOTE — Anesthesia Preprocedure Evaluation (Addendum)
Anesthesia Evaluation  Patient identified by MRN, date of birth, ID band Patient awake    Reviewed: Allergy & Precautions, H&P , NPO status , Patient's Chart, lab work & pertinent test results  Airway Mallampati: II TM Distance: >3 FB Neck ROM: Full    Dental  (+) Edentulous Upper, Edentulous Lower   Pulmonary  breath sounds clear to auscultation        Cardiovascular hypertension, + Peripheral Vascular Disease Rhythm:Regular Rate:Normal     Neuro/Psych TIA   GI/Hepatic   Endo/Other  diabetes, Type 2, Insulin Dependent  Renal/GU CRFRenal disease     Musculoskeletal   Abdominal (+) + obese,   Peds  Hematology  (+) anemia ,   Anesthesia Other Findings Pt. Currently has venous stasis ulcer of left lower extremity. Hx of DJD  Reproductive/Obstetrics                      Anesthesia Physical Anesthesia Plan  ASA: III  Anesthesia Plan: General   Post-op Pain Management:    Induction: Intravenous  Airway Management Planned: Oral ETT  Additional Equipment:   Intra-op Plan:   Post-operative Plan: Extubation in OR  Informed Consent: I have reviewed the patients History and Physical, chart, labs and discussed the procedure including the risks, benefits and alternatives for the proposed anesthesia with the patient or authorized representative who has indicated his/her understanding and acceptance.   Dental advisory given  Plan Discussed with: CRNA and Anesthesiologist  Anesthesia Plan Comments: (Called to evaluate pt for suitability for anesthesia and surgery. Pt reports that she was told she was not able to undergo anesthesia for previous surgery.  She was not able to state the reason for this claim. She is an obese, diabetic with hypertension. No HX of heart problems or anesthetic problems in the past.  I see no reason she can not be anesthetized safely.  We discussed the risks and benefits and  she is willing to have an anesthetic.  Tobias Alexander, MD)       Anesthesia Quick Evaluation

## 2014-01-13 DIAGNOSIS — L97909 Non-pressure chronic ulcer of unspecified part of unspecified lower leg with unspecified severity: Secondary | ICD-10-CM

## 2014-01-13 DIAGNOSIS — N183 Chronic kidney disease, stage 3 unspecified: Secondary | ICD-10-CM

## 2014-01-13 DIAGNOSIS — E876 Hypokalemia: Secondary | ICD-10-CM | POA: Insufficient documentation

## 2014-01-13 DIAGNOSIS — I872 Venous insufficiency (chronic) (peripheral): Secondary | ICD-10-CM

## 2014-01-13 DIAGNOSIS — D649 Anemia, unspecified: Secondary | ICD-10-CM | POA: Diagnosis present

## 2014-01-13 LAB — SURGICAL PCR SCREEN
MRSA, PCR: POSITIVE — AB
STAPHYLOCOCCUS AUREUS: POSITIVE — AB

## 2014-01-13 LAB — CBC
HCT: 22.7 % — ABNORMAL LOW (ref 36.0–46.0)
Hemoglobin: 7.5 g/dL — ABNORMAL LOW (ref 12.0–15.0)
MCH: 29.3 pg (ref 26.0–34.0)
MCHC: 33 g/dL (ref 30.0–36.0)
MCV: 88.7 fL (ref 78.0–100.0)
Platelets: 183 10*3/uL (ref 150–400)
RBC: 2.56 MIL/uL — ABNORMAL LOW (ref 3.87–5.11)
RDW: 17.3 % — ABNORMAL HIGH (ref 11.5–15.5)
WBC: 7.8 10*3/uL (ref 4.0–10.5)

## 2014-01-13 LAB — BASIC METABOLIC PANEL
BUN: 37 mg/dL — ABNORMAL HIGH (ref 6–23)
CHLORIDE: 95 meq/L — AB (ref 96–112)
CO2: 20 mEq/L (ref 19–32)
Calcium: 8.5 mg/dL (ref 8.4–10.5)
Creatinine, Ser: 1.98 mg/dL — ABNORMAL HIGH (ref 0.50–1.10)
GFR, EST AFRICAN AMERICAN: 28 mL/min — AB (ref >90)
GFR, EST NON AFRICAN AMERICAN: 25 mL/min — AB (ref >90)
Glucose, Bld: 170 mg/dL — ABNORMAL HIGH (ref 70–99)
Potassium: 3.2 mEq/L — ABNORMAL LOW (ref 3.7–5.3)
SODIUM: 132 meq/L — AB (ref 137–147)

## 2014-01-13 LAB — URINE CULTURE
COLONY COUNT: NO GROWTH
Culture: NO GROWTH

## 2014-01-13 LAB — GLUCOSE, CAPILLARY
GLUCOSE-CAPILLARY: 134 mg/dL — AB (ref 70–99)
GLUCOSE-CAPILLARY: 209 mg/dL — AB (ref 70–99)
GLUCOSE-CAPILLARY: 78 mg/dL (ref 70–99)
Glucose-Capillary: 174 mg/dL — ABNORMAL HIGH (ref 70–99)
Glucose-Capillary: 107 mg/dL — ABNORMAL HIGH (ref 70–99)

## 2014-01-13 MED ORDER — RISAQUAD PO CAPS
2.0000 | ORAL_CAPSULE | Freq: Two times a day (BID) | ORAL | Status: DC
  Administered 2014-01-13 – 2014-01-17 (×7): 2 via ORAL
  Filled 2014-01-13 (×9): qty 2

## 2014-01-13 MED ORDER — POTASSIUM CHLORIDE CRYS ER 20 MEQ PO TBCR
40.0000 meq | EXTENDED_RELEASE_TABLET | Freq: Once | ORAL | Status: AC
  Administered 2014-01-13: 40 meq via ORAL
  Filled 2014-01-13: qty 2

## 2014-01-13 NOTE — Progress Notes (Addendum)
01-13-14 Please sign VAC application in shadow chart , including measurements of wound width , length and depth . Will also need order for home health RN.  Thanks Magdalen Spatz RN BSN [phone]

## 2014-01-13 NOTE — H&P (Signed)
[name] is an 68 y.o. female.   Chief Complaint: Pt with Hx DM; CKD with left leg venous stasis ulcer Has been treating this for yrs HHRN dressing at home last several weeks Was better til reinfected: +pseudomonas Now scheduled for I/D in OR tomorrow am Will need at home antibiotics CKD: Cr high at 1.9 Scheduled for tunneled central catheter placement in IR 5/6  HPI: DM; CKD; Left leg venous stasis ulcer  Past Medical History  Diagnosis Date  . Venous insufficiency   . Venous stasis ulcer   . Diabetes mellitus due to underlying condition with diabetic nephropathy   . Neuropathy   . DJD (degenerative joint disease)   . Anemia   . Nephropathy, diabetic     Past Surgical History  Procedure Laterality Date  . Tonsilectomy, adenoidectomy, bilateral myringotomy and tubes    . Mouth surgery      Family History  Problem Relation Age of Onset  . Heart disease Mother   . Heart disease Father   . Diabetes Maternal Grandmother    Social History:  reports that she has never smoked. She has never used smokeless tobacco. She reports that she does not drink alcohol or use illicit drugs.  Allergies:  Allergies  Allergen Reactions  . Bactrim [Sulfamethoxazole-Tmp Ds] Shortness Of Breath    Depression based reactions, altered mentality  . Atorvastatin Other (See Comments)    aches  . Sulfamethoxazole-Trimethoprim Swelling  . Actos [Pioglitazone Hydrochloride] Other (See Comments)    unknown  . Celebrex [Celecoxib] Other (See Comments) and Rash    unknown  . Ibuprofen Other (See Comments) and Rash  . Latex Rash  . Neosporin [Neomycin-Bacitracin Zn-Polymyx] Rash  . Pioglitazone Rash    Medications Prior to Admission  Medication Sig Dispense Refill  . acetic acid 0.25 % irrigation Irrigate with as directed daily.      Marland Kitchen doxycycline (VIBRAMYCIN) 50 MG capsule Take 2 capsules (100 mg total) by mouth 2 (two) times daily.  56 capsule  3  . glipiZIDE (GLUCOTROL) 10 MG tablet  Take 10 mg by mouth 2 (two) times daily before a meal.      . hydrochlorothiazide 25 MG tablet Take 25 mg by mouth daily.       . hydrOXYzine (ATARAX/VISTARIL) 50 MG tablet Take 50 mg by mouth 3 (three) times daily as needed for itching.       . insulin NPH-insulin regular (NOVOLIN 70/30) (70-30) 100 UNIT/ML injection Inject 80 Units into the skin 2 (two) times daily with a meal.       . insulin regular (NOVOLIN R,HUMULIN R) 100 units/mL injection Inject 0-20 Units into the skin daily as needed for high blood sugar. Sliding scale      . iron polysaccharides (NIFEREX) 150 MG capsule Take 150 mg by mouth 2 (two) times daily.       Marland Kitchen lactobacillus acidophilus (BACID) TABS Take 2 tablets by mouth daily after breakfast.        . latanoprost (XALATAN) 0.005 % ophthalmic solution Place 1 drop into both eyes at bedtime.      Marland Kitchen liver oil-zinc oxide (DESITIN) 40 % ointment Apply 1 application topically daily.      . mupirocin ointment (BACTROBAN) 2 % Apply 1 application topically daily.      . potassium chloride (K-DUR,KLOR-CON) 10 MEQ tablet Take 10 mEq by mouth 2 (two) times daily.      . timolol (TIMOPTIC) 0.5 % ophthalmic solution Place 1 drop into both  eyes daily.      . traMADol (ULTRAM) 50 MG tablet Take 50 mg by mouth every 6 (six) hours as needed for moderate pain.       . vitamin C (ASCORBIC ACID) 500 MG tablet Take 500 mg by mouth 2 (two) times daily.       . bumetanide (BUMEX) 2 MG tablet Take 2 mg by mouth 2 (two) times daily.        . COD LIVER OIL PO Take 1,000 mg by mouth daily.          Results for orders placed during the hospital encounter of 01/11/14 (from the past 48 hour(s))  CBC WITH DIFFERENTIAL     Status: Abnormal   Collection Time    01/11/14  6:15 PM      Result Value Ref Range   WBC 18.2 (*) 4.0 - 10.5 K/uL   RBC 2.65 (*) 3.87 - 5.11 MIL/uL   Hemoglobin 8.0 (*) 12.0 - 15.0 g/dL   HCT 23.3 (*) 36.0 - 46.0 %   MCV 87.9  78.0 - 100.0 fL   MCH 30.2  26.0 - 34.0 pg   MCHC  34.3  30.0 - 36.0 g/dL   RDW 17.1 (*) 11.5 - 15.5 %   Platelets 199  150 - 400 K/uL   Neutrophils Relative % 93 (*) 43 - 77 %   Neutro Abs 17.0 (*) 1.7 - 7.7 K/uL   Lymphocytes Relative 3 (*) 12 - 46 %   Lymphs Abs 0.5 (*) 0.7 - 4.0 K/uL   Monocytes Relative 4  3 - 12 %   Monocytes Absolute 0.8  0.1 - 1.0 K/uL   Eosinophils Relative 0  0 - 5 %   Eosinophils Absolute 0.0  0.0 - 0.7 K/uL   Basophils Relative 0  0 - 1 %   Basophils Absolute 0.0  0.0 - 0.1 K/uL  BASIC METABOLIC PANEL     Status: Abnormal   Collection Time    01/11/14  6:15 PM      Result Value Ref Range   Sodium 139  137 - 147 mEq/L   Potassium 4.6  3.7 - 5.3 mEq/L   Chloride 97  96 - 112 mEq/L   CO2 25  19 - 32 mEq/L   Glucose, Bld 104 (*) 70 - 99 mg/dL   BUN 10  6 - 23 mg/dL   Creatinine, Ser 0.98  0.50 - 1.10 mg/dL   Calcium 9.9  8.4 - 10.5 mg/dL   GFR calc non Af Amer 58 (*) >90 mL/min   GFR calc Af Amer 67 (*) >90 mL/min   Comment: (NOTE)     The eGFR has been calculated using the CKD EPI equation.     This calculation has not been validated in all clinical situations.     eGFR's persistently <90 mL/min signify possible Chronic Kidney     Disease.  I-STAT CG4 LACTIC ACID, ED     Status: None   Collection Time    01/11/14  6:23 PM      Result Value Ref Range   Lactic Acid, Venous 1.58  0.5 - 2.2 mmol/L  CBG MONITORING, ED     Status: Abnormal   Collection Time    01/11/14  9:54 PM      Result Value Ref Range   Glucose-Capillary 158 (*) 70 - 99 mg/dL  CULTURE, BLOOD (ROUTINE X 2)     Status: None   Collection Time  01/11/14 10:55 PM      Result Value Ref Range   Specimen Description BLOOD RIGHT ARM     Special Requests BOTTLES DRAWN AEROBIC AND ANAEROBIC 10CC EACH     Culture  Setup Time       Value: 01/12/2014 09:38     Performed at [facility]   Culture       Value:        BLOOD CULTURE RECEIVED NO GROWTH TO DATE CULTURE WILL BE HELD FOR 5 DAYS BEFORE ISSUING A FINAL NEGATIVE REPORT      Performed at [facility]   Report Status PENDING    CULTURE, BLOOD (ROUTINE X 2)     Status: None   Collection Time    01/11/14 11:08 PM      Result Value Ref Range   Specimen Description BLOOD LEFT HAND     Special Requests BOTTLES DRAWN AEROBIC AND ANAEROBIC 5CC EACH     Culture  Setup Time       Value: 01/12/2014 09:38     Performed at [facility]   Culture       Value:        BLOOD CULTURE RECEIVED NO GROWTH TO DATE CULTURE WILL BE HELD FOR 5 DAYS BEFORE ISSUING A FINAL NEGATIVE REPORT     Performed at [facility]   Report Status PENDING    URINALYSIS, ROUTINE W REFLEX MICROSCOPIC     Status: Abnormal   Collection Time    01/12/14 12:54 AM      Result Value Ref Range   Color, Urine YELLOW  YELLOW   APPearance CLEAR  CLEAR   Specific Gravity, Urine 1.021  1.005 - 1.030   pH 5.0  5.0 - 8.0   Glucose, UA NEGATIVE  NEGATIVE mg/dL   Hgb urine dipstick NEGATIVE  NEGATIVE   Bilirubin Urine NEGATIVE  NEGATIVE   Ketones, ur NEGATIVE  NEGATIVE mg/dL   Protein, ur NEGATIVE  NEGATIVE mg/dL   Urobilinogen, UA 1.0  0.0 - 1.0 mg/dL   Nitrite NEGATIVE  NEGATIVE   Leukocytes, UA MODERATE (*) NEGATIVE  URINE CULTURE     Status: None   Collection Time    01/12/14 12:54 AM      Result Value Ref Range   Specimen Description URINE, CLEAN CATCH     Special Requests NONE     Culture  Setup Time       Value: 01/12/2014 09:56     Performed at [facility]       Value: NO GROWTH     Performed at [facility]   Culture       Value: NO GROWTH     Performed at [facility]   Report Status 01/13/2014 FINAL    URINE MICROSCOPIC-ADD ON     Status: None   Collection Time    01/12/14 12:54 AM      Result Value Ref Range   Squamous Epithelial / LPF RARE  RARE   WBC, UA 7-10  <3 WBC/hpf   RBC / HPF 0-2  <3 RBC/hpf   Bacteria, UA RARE  RARE  CBC     Status: Abnormal   Collection Time    01/12/14  5:30 AM      Result Value Ref  Range   WBC 11.3 (*) 4.0 - 10.5 K/uL   RBC 2.58 (*) 3.87 - 5.11 MIL/uL   Hemoglobin 7.6 (*) 12.0 -  15.0 g/dL   HCT 22.8 (*) 36.0 - 46.0 %   MCV 88.4  78.0 - 100.0 fL   MCH 29.5  26.0 - 34.0 pg   MCHC 33.3  30.0 - 36.0 g/dL   RDW 17.3 (*) 11.5 - 15.5 %   Platelets 172  150 - 400 K/uL  GLUCOSE, CAPILLARY     Status: Abnormal   Collection Time    01/12/14  8:03 AM      Result Value Ref Range   Glucose-Capillary 133 (*) 70 - 99 mg/dL  BASIC METABOLIC PANEL     Status: Abnormal   Collection Time    01/12/14  8:05 AM      Result Value Ref Range   Sodium 131 (*) 137 - 147 mEq/L   Comment: RESULTS VERIFIED VIA RECOLLECT   Potassium 3.1 (*) 3.7 - 5.3 mEq/L   Comment: RESULTS VERIFIED VIA RECOLLECT   Chloride 94 (*) 96 - 112 mEq/L   CO2 22  19 - 32 mEq/L   Glucose, Bld 138 (*) 70 - 99 mg/dL   BUN 37 (*) 6 - 23 mg/dL   Comment: RESULTS VERIFIED VIA RECOLLECT   Creatinine, Ser 1.93 (*) 0.50 - 1.10 mg/dL   Comment: RESULTS VERIFIED VIA RECOLLECT   Calcium 8.8  8.4 - 10.5 mg/dL   GFR calc non Af Amer 25 (*) >90 mL/min   GFR calc Af Amer 29 (*) >90 mL/min   Comment: (NOTE)     The eGFR has been calculated using the CKD EPI equation.     This calculation has not been validated in all clinical situations.     eGFR's persistently <90 mL/min signify possible Chronic Kidney     Disease.  CBC     Status: Abnormal   Collection Time    01/12/14  8:05 AM      Result Value Ref Range   WBC 11.0 (*) 4.0 - 10.5 K/uL   RBC 2.68 (*) 3.87 - 5.11 MIL/uL   Hemoglobin 7.9 (*) 12.0 - 15.0 g/dL   HCT 23.6 (*) 36.0 - 46.0 %   MCV 88.1  78.0 - 100.0 fL   MCH 29.5  26.0 - 34.0 pg   MCHC 33.5  30.0 - 36.0 g/dL   RDW 17.3 (*) 11.5 - 15.5 %   Platelets 170  150 - 400 K/uL  GLUCOSE, CAPILLARY     Status: Abnormal   Collection Time    01/12/14 12:24 PM      Result Value Ref Range   Glucose-Capillary 137 (*) 70 - 99 mg/dL  BASIC METABOLIC PANEL     Status: Abnormal   Collection Time    01/12/14  3:38 PM       Result Value Ref Range   Sodium 134 (*) 137 - 147 mEq/L   Potassium 3.0 (*) 3.7 - 5.3 mEq/L   Chloride 95 (*) 96 - 112 mEq/L   CO2 22  19 - 32 mEq/L   Glucose, Bld 102 (*) 70 - 99 mg/dL   BUN 38 (*) 6 - 23 mg/dL   Creatinine, Ser 2.05 (*) 0.50 - 1.10 mg/dL   Calcium 9.2  8.4 - 10.5 mg/dL   GFR calc non Af Amer 24 (*) >90 mL/min   GFR calc Af Amer 27 (*) >90 mL/min   Comment: (NOTE)     The eGFR has been calculated using the CKD EPI equation.     This calculation has not been validated in all clinical situations.  eGFR's persistently <90 mL/min signify possible Chronic Kidney     Disease.  GLUCOSE, CAPILLARY     Status: None   Collection Time    01/12/14  5:56 PM      Result Value Ref Range   Glucose-Capillary 98  70 - 99 mg/dL  GLUCOSE, CAPILLARY     Status: Abnormal   Collection Time    01/12/14  9:53 PM      Result Value Ref Range   Glucose-Capillary 148 (*) 70 - 99 mg/dL  GLUCOSE, CAPILLARY     Status: Abnormal   Collection Time    01/13/14 12:55 AM      Result Value Ref Range   Glucose-Capillary 134 (*) 70 - 99 mg/dL   Comment 1 Notify RN    BASIC METABOLIC PANEL     Status: Abnormal   Collection Time    01/13/14  4:23 AM      Result Value Ref Range   Sodium 132 (*) 137 - 147 mEq/L   Potassium 3.2 (*) 3.7 - 5.3 mEq/L   Chloride 95 (*) 96 - 112 mEq/L   CO2 20  19 - 32 mEq/L   Glucose, Bld 170 (*) 70 - 99 mg/dL   BUN 37 (*) 6 - 23 mg/dL   Creatinine, Ser 1.98 (*) 0.50 - 1.10 mg/dL   Calcium 8.5  8.4 - 10.5 mg/dL   GFR calc non Af Amer 25 (*) >90 mL/min   GFR calc Af Amer 28 (*) >90 mL/min   Comment: (NOTE)     The eGFR has been calculated using the CKD EPI equation.     This calculation has not been validated in all clinical situations.     eGFR's persistently <90 mL/min signify possible Chronic Kidney     Disease.  CBC     Status: Abnormal   Collection Time    01/13/14  4:23 AM      Result Value Ref Range   WBC 7.8  4.0 - 10.5 K/uL   RBC 2.56 (*)  3.87 - 5.11 MIL/uL   Hemoglobin 7.5 (*) 12.0 - 15.0 g/dL   HCT 22.7 (*) 36.0 - 46.0 %   MCV 88.7  78.0 - 100.0 fL   MCH 29.3  26.0 - 34.0 pg   MCHC 33.0  30.0 - 36.0 g/dL   RDW 17.3 (*) 11.5 - 15.5 %   Platelets 183  150 - 400 K/uL  GLUCOSE, CAPILLARY     Status: Abnormal   Collection Time    01/13/14  7:35 AM      Result Value Ref Range   Glucose-Capillary 174 (*) 70 - 99 mg/dL  GLUCOSE, CAPILLARY     Status: Abnormal   Collection Time    01/13/14 11:45 AM      Result Value Ref Range   Glucose-Capillary 209 (*) 70 - 99 mg/dL   Dg Chest 2 View  01/11/2014   CLINICAL DATA:  Fever  EXAM: CHEST  2 VIEW  COMPARISON:  DG CHEST 2 VIEW dated 08/29/2011  FINDINGS: There is no focal parenchymal opacity, pleural effusion, or pneumothorax. There is stable cardiomegaly.  There is mild thoracic spine spondylosis.  IMPRESSION: No active cardiopulmonary disease.   Electronically Signed   By: Kathreen Devoid   On: 01/11/2014 21:57    Review of Systems  Constitutional: Positive for chills. Negative for fever.  Respiratory: Negative for shortness of breath.   Cardiovascular: Negative for chest pain.  Gastrointestinal: Negative for vomiting and  abdominal pain.  Musculoskeletal:       L leg pain  Neurological: Negative for weakness.    Blood pressure 119/45, pulse 65, temperature 98 F (36.7 C), temperature source Oral, resp. rate 18, height '5\' 7"'  (1.702 m), weight 142.575 kg (314 lb 5.1 oz), SpO2 95.00%. Physical Exam  Constitutional: She is oriented to person, place, and time.  obese  Cardiovascular: Normal rate and regular rhythm.   Respiratory: Effort normal and breath sounds normal. She has no wheezes.  GI: Soft. Bowel sounds are normal. There is no tenderness.  Musculoskeletal: Normal range of motion. She exhibits tenderness.  Left leg swelling; wrapped secondary venous stasis ulcer  Neurological: She is alert and oriented to person, place, and time.  Skin: Skin is dry. There is erythema.   Psychiatric: She has a normal mood and affect. Her behavior is normal. Judgment and thought content normal.     Assessment/Plan Left leg venous stasis ulcer- +pseudomonas Need long term at home antibiotics CKD Scheduled for tunneled central catheter placement in IR 5/6 Pt aware of procedure benefits and risks and agreeable to proceed Consent signed and in chart  Lavonia Drafts 01/13/2014, 3:20 PM

## 2014-01-13 NOTE — Progress Notes (Signed)
PROGRESS NOTE    Michael Ventresca BPZ:[id number] DOB: 08/27/1946 DOA: 01/11/2014 PCP: Shirline Frees, MD  HPI/Brief narrative 68 year old female with history of DM 2, chronic kidney disease, chronic lower extremity venous stasis ulcers for which she follows with [facility], home health RN for dressing changes and [facility]. Her left leg wound had significantly improved until she got Pseudomonas infection and it worsened. She was started on oral doxycycline for/14/15 and was doing well during followup on 4/29. She then developed chills, stopped her antibiotics and had fevers. Admitted to hospital for further management.     Assessment/Plan:  1. Sepsis secondary to Left leg infected (pseudomonas) chronic venous stasis ulcer/cellulitis: Infectious disease consulted. They recommend consult by wound M.D., PICC line and home on IV antibiotics when improving. Dr. Johnnye Sima discussed with her M.D. in [place] who recommended [facility] in [place]. Plastic surgery input appreciated-plan for I&D in OR possibly tomorrow. Patient has chronic kidney disease with baseline creatinine probably in the 1.7-1.8 range. Will discuss with her primary nephrologist for placing PICC line. Patient having diarrhea and requests that her probiotics be increased. 2. DM 2: Continue home 70/30 insulin. DC to oral medications due to risk for hypoglycemia. Add NovoLog SSI. Fluctuating but reasonable inpatient control. 3. Chronic kidney disease: Baseline creatinine in the range of 1.7. Patient was able to pull up her labs on my chart and in December 2014, creatinine was 1.77 and hemoglobin 11 g per DL suggesting current creatinine probably is close to her baseline. She did not followup with Dr. Moshe Cipro, nephrology due to financial issues. 4. Anemia: Likely secondary to chronic kidney disease. Stable. Transfuse if hemoglobin less than 7 g per DL. 5. Hypokalemia: Replace and follow BMP 6. Chronic venous stasis: Usually takes HCTZ and  bumetanide at home. Her hyponatremia and hypokalemia probably related to HCTZ.   Code Status: Full Family Communication: None at bedside Disposition Plan: Remains inpatient   Consultants:  Infectious disease  Plastic surgery  Procedures:  None  Antibiotics:  IV Zosyn 5/3 >    IV vancomycin 5/3 >  Subjective: Diarrhea. Requests increase of probiotics which usually works for her.  Objective: Filed Vitals:   01/12/14 0600 01/12/14 1423 01/12/14 2200 01/13/14 0551  BP: 121/59 113/61 132/58 122/61  Pulse: 69 63 93 85  Temp: 98.7 F (37.1 C) 98.4 F (36.9 C) 97.8 F (36.6 C) 98.2 F (36.8 C)  TempSrc:  Oral Oral Oral  Resp: 20 18 20 18   Height:      Weight:      SpO2: 100% 96% 94% 92%    Intake/Output Summary (Last 24 hours) at 01/13/14 1351 Last data filed at 01/13/14 0535  Gross per 24 hour  Intake   2338 ml  Output   1300 ml  Net   1038 ml   Filed Weights   01/11/14 1759 01/12/14 0038  Weight: 140.615 kg (310 lb) 142.575 kg (314 lb 5.1 oz)     Exam:  General exam: Pleasant middle-age female sitting up comfortably on chair Respiratory system: Clear. No increased work of breathing. Cardiovascular system: S1 & S2 heard, RRR. No JVD, murmurs, gallops, clicks or pedal edema. Gastrointestinal system: Abdomen is nondistended, soft and nontender. Normal bowel sounds heard. Central nervous system: Alert and oriented. No focal neurological deficits. Extremities: Left leg dressing clean and dry.   Data Reviewed: Basic Metabolic Panel:  Recent Labs Lab 01/11/14 1815 01/12/14 0805 01/12/14 1538 01/13/14 0423  NA 139 131* 134* 132*  K 4.6 3.1*  3.0* 3.2*  CL 97 94* 95* 95*  CO2 25 22 22 20   GLUCOSE 104* 138* 102* 170*  BUN 10 37* 38* 37*  CREATININE 0.98 1.93* 2.05* 1.98*  CALCIUM 9.9 8.8 9.2 8.5   Liver Function Tests: No results found for this basename: AST, ALT, ALKPHOS, BILITOT, PROT, ALBUMIN,  in the last 168 hours No results found for this  basename: LIPASE, AMYLASE,  in the last 168 hours No results found for this basename: AMMONIA,  in the last 168 hours CBC:  Recent Labs Lab 01/11/14 1815 01/12/14 0530 01/12/14 0805 01/13/14 0423  WBC 18.2* 11.3* 11.0* 7.8  NEUTROABS 17.0*  --   --   --   HGB 8.0* 7.6* 7.9* 7.5*  HCT 23.3* 22.8* 23.6* 22.7*  MCV 87.9 88.4 88.1 88.7  PLT 199 172 170 183   Cardiac Enzymes: No results found for this basename: CKTOTAL, CKMB, CKMBINDEX, TROPONINI,  in the last 168 hours BNP (last 3 results) No results found for this basename: PROBNP,  in the last 8760 hours CBG:  Recent Labs Lab 01/12/14 1756 01/12/14 2153 01/13/14 0055 01/13/14 0735 01/13/14 1145  GLUCAP 98 148* 134* 174* 209*    Recent Results (from the past 240 hour(s))  CULTURE, BLOOD (ROUTINE X 2)     Status: None   Collection Time    01/11/14 10:55 PM      Result Value Ref Range Status   Specimen Description BLOOD RIGHT ARM   Final   Special Requests BOTTLES DRAWN AEROBIC AND ANAEROBIC 10CC EACH   Final   Culture  Setup Time     Final   Value: 01/12/2014 09:38     Performed at [facility]   Culture     Final   Value:        BLOOD CULTURE RECEIVED NO GROWTH TO DATE CULTURE WILL BE HELD FOR 5 DAYS BEFORE ISSUING A FINAL NEGATIVE REPORT     Performed at [facility]   Report Status PENDING   Incomplete  CULTURE, BLOOD (ROUTINE X 2)     Status: None   Collection Time    01/11/14 11:08 PM      Result Value Ref Range Status   Specimen Description BLOOD LEFT HAND   Final   Special Requests BOTTLES DRAWN AEROBIC AND ANAEROBIC 5CC EACH   Final   Culture  Setup Time     Final   Value: 01/12/2014 09:38     Performed at [facility]   Culture     Final   Value:        BLOOD CULTURE RECEIVED NO GROWTH TO DATE CULTURE WILL BE HELD FOR 5 DAYS BEFORE ISSUING A FINAL NEGATIVE REPORT     Performed at [facility]   Report Status PENDING   Incomplete  URINE CULTURE     Status: None    Collection Time    01/12/14 12:54 AM      Result Value Ref Range Status   Specimen Description URINE, CLEAN CATCH   Final   Special Requests NONE   Final   Culture  Setup Time     Final   Value: 01/12/2014 09:56     Performed at [facility]     Final   Value: NO GROWTH     Performed at [facility]   Culture     Final   Value: NO GROWTH     Performed at  [facility]   Report Status 01/13/2014 FINAL   Final         Studies: Dg Chest 2 View  01/11/2014   CLINICAL DATA:  Fever  EXAM: CHEST  2 VIEW  COMPARISON:  DG CHEST 2 VIEW dated 08/29/2011  FINDINGS: There is no focal parenchymal opacity, pleural effusion, or pneumothorax. There is stable cardiomegaly.  There is mild thoracic spine spondylosis.  IMPRESSION: No active cardiopulmonary disease.   Electronically Signed   By: Kathreen Devoid   On: 01/11/2014 21:57        Scheduled Meds: . acetic acid   Irrigation Daily  . acidophilus  2 capsule Oral Daily  . heparin  5,000 Units Subcutaneous 3 times per day  . insulin aspart  0-9 Units Subcutaneous TID WC  . insulin aspart protamine- aspart  80 Units Subcutaneous BID WC  . iron polysaccharides  150 mg Oral BID  . latanoprost  1 drop Both Eyes QHS  . mupirocin ointment  1 application Topical Daily  . piperacillin-tazobactam (ZOSYN)  IV  3.375 g Intravenous 3 times per day  . timolol  1 drop Both Eyes Daily  . vancomycin  2,000 mg Intravenous Q24H   Continuous Infusions:   Principal Problem:   Venous stasis ulcer of left lower extremity Active Problems:   Type II or unspecified type diabetes mellitus with unspecified complication, uncontrolled   Sepsis   Ulcer of left lower leg    Time spent: 25 minutes.    Modena Jansky, MD, FACP, [facility] Pager [phone]  If 7PM-7AM, please contact night-coverage [email or web address] Password [email or web address] 01/13/2014, 1:51 PM    LOS: 2 days

## 2014-01-13 NOTE — Consult Note (Signed)
WOC follow-up: Plastics team in earlier to assess left leg and plans for surgery tomorrow.  Applied acetic acid soak, then Aquacel and 3 layers of Profore as previous plan of care ordered by [facility] outpatient wound care center.  Pt tolerated with minimal discomfort.  Post-op plan of care will be determined by plastics team. Please re-consult if further assistance is needed.  Thank-you,  Julien Girt MSN, RN, CWOCN, CWCN-AP, CNS

## 2014-01-13 NOTE — Progress Notes (Signed)
INFECTIOUS DISEASE PROGRESS NOTE  ID: [name] is a 68 y.o. female with  Principal Problem:   Venous stasis ulcer of left lower extremity Active Problems:   Type II or unspecified type diabetes mellitus with unspecified complication, uncontrolled   Sepsis   Ulcer of left lower leg  Subjective: Having loose BM.   Abtx:  Anti-infectives   Start     Dose/Rate Route Frequency Ordered Stop   01/12/14 1600  vancomycin (VANCOCIN) 2,000 mg in sodium chloride 0.9 % 500 mL IVPB     2,000 mg 250 mL/hr over 120 Minutes Intravenous Every 24 hours 01/12/14 1359     01/12/14 0600  piperacillin-tazobactam (ZOSYN) IVPB 3.375 g     3.375 g 12.5 mL/hr over 240 Minutes Intravenous 3 times per day 01/11/14 2344     01/11/14 2230  vancomycin (VANCOCIN) 2,000 mg in sodium chloride 0.9 % 500 mL IVPB  Status:  Discontinued     2,000 mg 250 mL/hr over 120 Minutes Intravenous  Once 01/11/14 2224 01/11/14 2247   01/11/14 2230  piperacillin-tazobactam (ZOSYN) IVPB 3.375 g     3.375 g 100 mL/hr over 30 Minutes Intravenous  Once 01/11/14 2224 01/11/14 2340      Medications:  Scheduled: . acetic acid   Irrigation Daily  . acidophilus  2 capsule Oral Daily  . heparin  5,000 Units Subcutaneous 3 times per day  . insulin aspart  0-9 Units Subcutaneous TID WC  . insulin aspart protamine- aspart  80 Units Subcutaneous BID WC  . iron polysaccharides  150 mg Oral BID  . latanoprost  1 drop Both Eyes QHS  . mupirocin ointment  1 application Topical Daily  . piperacillin-tazobactam (ZOSYN)  IV  3.375 g Intravenous 3 times per day  . timolol  1 drop Both Eyes Daily  . vancomycin  2,000 mg Intravenous Q24H    Objective: Vital signs in last 24 hours: Temp:  [97.8 F (36.6 C)-98.4 F (36.9 C)] 98.2 F (36.8 C) (05/05 0551) Pulse Rate:  [63-93] 85 (05/05 0551) Resp:  [18-20] 18 (05/05 0551) BP: (113-132)/(58-61) 122/61 mmHg (05/05 0551) SpO2:  [92 %-96 %] 92 % (05/05 0551)   General appearance:  alert, cooperative and no distress Extremities: LLE wrapped.   Lab Results  Recent Labs  01/12/14 0805 01/12/14 1538 01/13/14 0423  WBC 11.0*  --  7.8  HGB 7.9*  --  7.5*  HCT 23.6*  --  22.7*  NA 131* 134* 132*  K 3.1* 3.0* 3.2*  CL 94* 95* 95*  CO2 22 22 20   BUN 37* 38* 37*  CREATININE 1.93* 2.05* 1.98*   Liver Panel No results found for this basename: PROT, ALBUMIN, AST, ALT, ALKPHOS, BILITOT, BILIDIR, IBILI,  in the last 72 hours Sedimentation Rate No results found for this basename: ESRSEDRATE,  in the last 72 hours C-Reactive Protein No results found for this basename: CRP,  in the last 72 hours  Microbiology: Recent Results (from the past 240 hour(s))  CULTURE, BLOOD (ROUTINE X 2)     Status: None   Collection Time    01/11/14 10:55 PM      Result Value Ref Range Status   Specimen Description BLOOD RIGHT ARM   Final   Special Requests BOTTLES DRAWN AEROBIC AND ANAEROBIC 10CC EACH   Final   Culture  Setup Time     Final   Value: 01/12/2014 09:38     Performed at [facility]  Final   Value:        BLOOD CULTURE RECEIVED NO GROWTH TO DATE CULTURE WILL BE HELD FOR 5 DAYS BEFORE ISSUING A FINAL NEGATIVE REPORT     Performed at [facility]   Report Status PENDING   Incomplete  CULTURE, BLOOD (ROUTINE X 2)     Status: None   Collection Time    01/11/14 11:08 PM      Result Value Ref Range Status   Specimen Description BLOOD LEFT HAND   Final   Special Requests BOTTLES DRAWN AEROBIC AND ANAEROBIC 5CC EACH   Final   Culture  Setup Time     Final   Value: 01/12/2014 09:38     Performed at [facility]   Culture     Final   Value:        BLOOD CULTURE RECEIVED NO GROWTH TO DATE CULTURE WILL BE HELD FOR 5 DAYS BEFORE ISSUING A FINAL NEGATIVE REPORT     Performed at [facility]   Report Status PENDING   Incomplete  URINE CULTURE     Status: None   Collection Time    01/12/14 12:54 AM      Result Value Ref Range Status    Specimen Description URINE, CLEAN CATCH   Final   Special Requests NONE   Final   Culture  Setup Time     Final   Value: 01/12/2014 09:56     Performed at [place]     Final   Value: NO GROWTH     Performed at [facility]   Culture     Final   Value: NO GROWTH     Performed at [facility]   Report Status 01/13/2014 FINAL   Final    Studies/Results: Dg Chest 2 View  01/11/2014   CLINICAL DATA:  Fever  EXAM: CHEST  2 VIEW  COMPARISON:  DG CHEST 2 VIEW dated 08/29/2011  FINDINGS: There is no focal parenchymal opacity, pleural effusion, or pneumothorax. There is stable cardiomegaly.  There is mild thoracic spine spondylosis.  IMPRESSION: No active cardiopulmonary disease.   Electronically Signed   By: Kathreen Devoid   On: 01/11/2014 21:57     Assessment/Plan: LLE ulcer DM Sepsis Prev pseudomonas (R- aztreonam, cipro)  Total days of antibiotics: 2 in hospital- vanco/zosyn  No change in anbx Await OR Cx, debridement Needs PIC, has elevated Cr.           [facility] (pager) [phone] [email or web address] 01/13/2014, 11:51 AM  LOS: 2 days   **Disclaimer: This note may have been dictated with voice recognition software. Similar sounding words can inadvertently be transcribed and this note may contain transcription errors which may not have been corrected upon publication of note.**

## 2014-01-13 NOTE — Care Management Note (Signed)
Page 1 of 2   01/17/2014     3:19:19 PM CARE MANAGEMENT NOTE 01/17/2014  Patient:  [name], [name]   Account Number:  [id number]  Date Initiated:  01/13/2014  Documentation initiated by:  Magdalen Spatz  Subjective/Objective Assessment:   adm:  left leg chronic venous stasis ulcer     Action/Plan:   discharge planning   Anticipated DC Date:  01/17/2014   Anticipated DC Plan:  [place]  CM consult      Choice offered to / List presented to:  C-1 Patient   DME arranged  VAC      DME agency  [employer]     HH arranged  HH-1 RN      [place].   Status of service:  Completed, signed off Medicare Important Message given?   (If response is "NO", the following Medicare IM given date fields will be blank) Date Medicare IM given:   Date Additional Medicare IM given:  01/13/2014  Discharge Disposition:  [place]  Per UR Regulation:    If discussed at Long Length of Stay Meetings, dates discussed:    Comments:  01/17/14 11:00 CM met with pt in room to retrieve aforementioned hardship form.  Pt ans pt's spouse stated they have spoken with the [employer] rep and do not fit the criteria for "hardship" but they have worked out a Agricultural consultant.  Pt to receive today's Cefepime and Vancomycin prior to discharge.  CM faxed both prescriptions to [facility] with notation of today's run of both antibiotics.  Vac in room and RN states comfort level in hooking up VAC.  RN to call IV team when IV runs are completed.  No other CM needs were communicated.  Mariane Masters, BSN, CM [phone].   01-14-14 0830  [employer] has signed VAC wound application . Need completed hardship form . Patient stated yesterday her husband was going to bring same in last evening. [name] currently in surgery . Will check back later to see if patient has form.  [facility] cell [phone] , office [phone] , spoke with [place] Reynolds who stated  patinet will need services of a home health agency while she is on home IV antibiotics , [facility] only does wound care not IV antibiotics . Will explain to patient .  Magdalen Spatz RN BSN    01-13-14  31 Spoke to [place] at Dr Leafy Ro office , Faxed [phone] VAC application to [place] for Dr Migdalia Dk to sign . Magdalen Spatz RN BSN [phone]   01-13-14 Discussed VAC with patient , she states [place] was in process of applying for [facility] however, co pay was going to be $60 / day x 3 days than $20 / day .  Called Dr Marcelyn Bruins office to see if she could sign VAC application today and fax it back to CM , to start application process , awaiting call back  Spoke with Ricki Toye from [facility] , have given patient hardship form to complete and sign . Once application signed and hardship form completed , application can be submitted to [facility].  Patient states she receives home health through [facility] , will need MD order to resume.   Magdalen Spatz RN BSN [phone]

## 2014-01-14 ENCOUNTER — Encounter (HOSPITAL_COMMUNITY): Payer: Medicare Other | Admitting: Anesthesiology

## 2014-01-14 ENCOUNTER — Inpatient Hospital Stay (HOSPITAL_COMMUNITY): Payer: Medicare Other | Admitting: Anesthesiology

## 2014-01-14 ENCOUNTER — Encounter (HOSPITAL_COMMUNITY): Payer: Self-pay | Admitting: Anesthesiology

## 2014-01-14 ENCOUNTER — Encounter (HOSPITAL_COMMUNITY)
Admission: EM | Disposition: A | Discharge status: Home Health | Payer: Self-pay | Source: Home / Self Care | Attending: Internal Medicine

## 2014-01-14 ENCOUNTER — Inpatient Hospital Stay (HOSPITAL_COMMUNITY): Payer: Medicare Other

## 2014-01-14 DIAGNOSIS — N179 Acute kidney failure, unspecified: Secondary | ICD-10-CM

## 2014-01-14 DIAGNOSIS — N189 Chronic kidney disease, unspecified: Secondary | ICD-10-CM

## 2014-01-14 DIAGNOSIS — E11622 Type 2 diabetes mellitus with other skin ulcer: Secondary | ICD-10-CM | POA: Diagnosis present

## 2014-01-14 DIAGNOSIS — L97909 Non-pressure chronic ulcer of unspecified part of unspecified lower leg with unspecified severity: Secondary | ICD-10-CM

## 2014-01-14 HISTORY — PX: I&D EXTREMITY: SHX5045

## 2014-01-14 LAB — GLUCOSE, CAPILLARY
GLUCOSE-CAPILLARY: 135 mg/dL — AB (ref 70–99)
GLUCOSE-CAPILLARY: 180 mg/dL — AB (ref 70–99)
Glucose-Capillary: 108 mg/dL — ABNORMAL HIGH (ref 70–99)
Glucose-Capillary: 102 mg/dL — ABNORMAL HIGH (ref 70–99)
Glucose-Capillary: 146 mg/dL — ABNORMAL HIGH (ref 70–99)

## 2014-01-14 LAB — PROTIME-INR
INR: 1.27 (ref 0.00–1.49)
PROTHROMBIN TIME: 15.6 s — AB (ref 11.6–15.2)

## 2014-01-14 LAB — CBC
HCT: 22.3 % — ABNORMAL LOW (ref 36.0–46.0)
Hemoglobin: 7.6 g/dL — ABNORMAL LOW (ref 12.0–15.0)
MCH: 30 pg (ref 26.0–34.0)
MCHC: 34.1 g/dL (ref 30.0–36.0)
MCV: 88.1 fL (ref 78.0–100.0)
Platelets: 182 10*3/uL (ref 150–400)
RBC: 2.53 MIL/uL — ABNORMAL LOW (ref 3.87–5.11)
RDW: 17.2 % — AB (ref 11.5–15.5)
WBC: 6.8 10*3/uL (ref 4.0–10.5)

## 2014-01-14 LAB — BASIC METABOLIC PANEL
BUN: 39 mg/dL — AB (ref 6–23)
CO2: 21 mEq/L (ref 19–32)
Calcium: 8.6 mg/dL (ref 8.4–10.5)
Chloride: 98 mEq/L (ref 96–112)
Creatinine, Ser: 2.18 mg/dL — ABNORMAL HIGH (ref 0.50–1.10)
GFR, EST AFRICAN AMERICAN: 25 mL/min — AB (ref >90)
GFR, EST NON AFRICAN AMERICAN: 22 mL/min — AB (ref >90)
Glucose, Bld: 94 mg/dL (ref 70–99)
POTASSIUM: 3.1 meq/L — AB (ref 3.7–5.3)
SODIUM: 135 meq/L — AB (ref 137–147)

## 2014-01-14 LAB — FERRITIN: FERRITIN: 112 ng/mL (ref 10–291)

## 2014-01-14 LAB — MAGNESIUM: Magnesium: 2.5 mg/dL (ref 1.5–2.5)

## 2014-01-14 SURGERY — IRRIGATION AND DEBRIDEMENT EXTREMITY
Anesthesia: General | Site: Leg Lower | Laterality: Left

## 2014-01-14 MED ORDER — SUCCINYLCHOLINE CHLORIDE 20 MG/ML IJ SOLN
INTRAMUSCULAR | Status: DC | PRN
  Administered 2014-01-14: 100 mg via INTRAVENOUS

## 2014-01-14 MED ORDER — ONDANSETRON HCL 4 MG/2ML IJ SOLN
INTRAMUSCULAR | Status: DC | PRN
  Administered 2014-01-14: 4 mg via INTRAVENOUS

## 2014-01-14 MED ORDER — MUPIROCIN 2 % EX OINT
1.0000 "application " | TOPICAL_OINTMENT | Freq: Two times a day (BID) | CUTANEOUS | Status: DC
  Administered 2014-01-14 – 2014-01-17 (×6): 1 via NASAL

## 2014-01-14 MED ORDER — OXYCODONE HCL 5 MG/5ML PO SOLN: 5.0000 mg | Freq: Once | ORAL | Status: DC | PRN

## 2014-01-14 MED ORDER — FENTANYL CITRATE 0.05 MG/ML IJ SOLN
INTRAMUSCULAR | Status: AC
  Filled 2014-01-14: qty 5

## 2014-01-14 MED ORDER — FENTANYL CITRATE 0.05 MG/ML IJ SOLN
INTRAMUSCULAR | Status: DC | PRN
  Administered 2014-01-14 (×5): 50 ug via INTRAVENOUS

## 2014-01-14 MED ORDER — SUCCINYLCHOLINE CHLORIDE 20 MG/ML IJ SOLN
INTRAMUSCULAR | Status: AC
  Filled 2014-01-14: qty 1

## 2014-01-14 MED ORDER — DIAZEPAM 5 MG PO TABS
5.0000 mg | ORAL_TABLET | Freq: Once | ORAL | Status: AC
  Administered 2014-01-14: 5 mg via ORAL
  Filled 2014-01-14: qty 1

## 2014-01-14 MED ORDER — MIDAZOLAM HCL 5 MG/5ML IJ SOLN
INTRAMUSCULAR | Status: DC | PRN
  Administered 2014-01-14 (×2): 1 mg via INTRAVENOUS

## 2014-01-14 MED ORDER — ARTIFICIAL TEARS OP OINT
TOPICAL_OINTMENT | OPHTHALMIC | Status: DC | PRN
  Administered 2014-01-14: 1 via OPHTHALMIC

## 2014-01-14 MED ORDER — FENTANYL CITRATE 0.05 MG/ML IJ SOLN
INTRAMUSCULAR | Status: AC | PRN
  Administered 2014-01-14: 50 ug via INTRAVENOUS

## 2014-01-14 MED ORDER — SODIUM CHLORIDE 0.9 % IV SOLN
INTRAVENOUS | Status: DC | PRN
  Administered 2014-01-14: 09:00:00 via INTRAVENOUS

## 2014-01-14 MED ORDER — ROCURONIUM BROMIDE 50 MG/5ML IV SOLN
INTRAVENOUS | Status: AC
  Filled 2014-01-14: qty 1

## 2014-01-14 MED ORDER — SODIUM CHLORIDE 0.9 % IR SOLN
Status: DC | PRN
  Administered 2014-01-14: 09:00:00

## 2014-01-14 MED ORDER — PROPOFOL 10 MG/ML IV BOLUS
INTRAVENOUS | Status: AC
  Filled 2014-01-14: qty 20

## 2014-01-14 MED ORDER — FENTANYL CITRATE 0.05 MG/ML IJ SOLN
INTRAMUSCULAR | Status: AC
  Filled 2014-01-14: qty 2

## 2014-01-14 MED ORDER — LIDOCAINE HCL (CARDIAC) 10 MG/ML IV SOLN
INTRAVENOUS | Status: DC | PRN
  Administered 2014-01-14: 40 mg via INTRAVENOUS

## 2014-01-14 MED ORDER — HYDROMORPHONE HCL PF 1 MG/ML IJ SOLN
INTRAMUSCULAR | Status: AC
  Filled 2014-01-14: qty 1

## 2014-01-14 MED ORDER — PROPOFOL 10 MG/ML IV BOLUS
INTRAVENOUS | Status: DC | PRN
  Administered 2014-01-14: 130 mg via INTRAVENOUS

## 2014-01-14 MED ORDER — OXYCODONE HCL 5 MG PO TABS: 5.0000 mg | ORAL_TABLET | Freq: Once | ORAL | Status: DC | PRN

## 2014-01-14 MED ORDER — HYDROMORPHONE HCL PF 1 MG/ML IJ SOLN
0.2500 mg | INTRAMUSCULAR | Status: DC | PRN
  Administered 2014-01-14: 0.5 mg via INTRAVENOUS

## 2014-01-14 MED ORDER — ARTIFICIAL TEARS OP OINT
TOPICAL_OINTMENT | OPHTHALMIC | Status: AC
  Filled 2014-01-14: qty 3.5

## 2014-01-14 MED ORDER — EPHEDRINE SULFATE 50 MG/ML IJ SOLN
INTRAMUSCULAR | Status: DC | PRN
  Administered 2014-01-14: 10 mg via INTRAVENOUS
  Administered 2014-01-14: 5 mg via INTRAVENOUS

## 2014-01-14 MED ORDER — POTASSIUM CHLORIDE CRYS ER 20 MEQ PO TBCR
40.0000 meq | EXTENDED_RELEASE_TABLET | Freq: Once | ORAL | Status: AC
  Administered 2014-01-14: 40 meq via ORAL
  Filled 2014-01-14: qty 2

## 2014-01-14 MED ORDER — POTASSIUM CHLORIDE IN NACL 40-0.9 MEQ/L-% IV SOLN
INTRAVENOUS | Status: AC
  Administered 2014-01-14 – 2014-01-15 (×2): via INTRAVENOUS
  Filled 2014-01-14 (×3): qty 1000

## 2014-01-14 MED ORDER — MIDAZOLAM HCL 2 MG/2ML IJ SOLN
INTRAMUSCULAR | Status: AC | PRN
  Administered 2014-01-14: 2 mg via INTRAVENOUS

## 2014-01-14 MED ORDER — CHLORHEXIDINE GLUCONATE CLOTH 2 % EX PADS
6.0000 | MEDICATED_PAD | Freq: Every day | CUTANEOUS | Status: DC
Start: 2014-01-14 — End: 2014-01-17
  Administered 2014-01-14 – 2014-01-17 (×4): 6 via TOPICAL

## 2014-01-14 MED ORDER — MIDAZOLAM HCL 2 MG/2ML IJ SOLN
INTRAMUSCULAR | Status: AC
  Filled 2014-01-14: qty 2

## 2014-01-14 MED ORDER — ONDANSETRON HCL 4 MG/2ML IJ SOLN
INTRAMUSCULAR | Status: AC
  Filled 2014-01-14: qty 2

## 2014-01-14 SURGICAL SUPPLY — 44 items
BANDAGE ELASTIC 4 VELCRO ST LF (GAUZE/BANDAGES/DRESSINGS) IMPLANT
BANDAGE ELASTIC 6 VELCRO ST LF (GAUZE/BANDAGES/DRESSINGS) ×3 IMPLANT
BANDAGE GAUZE ELAST BULKY 4 IN (GAUZE/BANDAGES/DRESSINGS) IMPLANT
BENZOIN TINCTURE PRP APPL 2/3 (GAUZE/BANDAGES/DRESSINGS) ×6 IMPLANT
BLADE 10 SAFETY STRL DISP (BLADE) ×3 IMPLANT
BLADE SURG ROTATE 9660 (MISCELLANEOUS) IMPLANT
BNDG GAUZE ELAST 4 BULKY (GAUZE/BANDAGES/DRESSINGS) ×6 IMPLANT
CANISTER SUCTION 2500CC (MISCELLANEOUS) ×3 IMPLANT
CHLORAPREP W/TINT 26ML (MISCELLANEOUS) IMPLANT
COVER SURGICAL LIGHT HANDLE (MISCELLANEOUS) ×3 IMPLANT
DRAPE EXTREMITY T 121X128X90 (DRAPE) IMPLANT
DRAPE INCISE IOBAN 66X45 STRL (DRAPES) ×3 IMPLANT
DRAPE ORTHO SPLIT 77X108 STRL (DRAPES)
DRAPE SURG ORHT 6 SPLT 77X108 (DRAPES) IMPLANT
DRSG ADAPTIC 3X8 NADH LF (GAUZE/BANDAGES/DRESSINGS) ×6 IMPLANT
DRSG PAD ABDOMINAL 8X10 ST (GAUZE/BANDAGES/DRESSINGS) IMPLANT
DRSG VAC ATS MED SENSATRAC (GAUZE/BANDAGES/DRESSINGS) ×3 IMPLANT
ELECT REM PT RETURN 9FT ADLT (ELECTROSURGICAL) ×3
ELECTRODE REM PT RTRN 9FT ADLT (ELECTROSURGICAL) ×1 IMPLANT
GLOVE BIO SURGEON STRL SZ 6.5 (GLOVE) ×2 IMPLANT
GLOVE BIO SURGEONS STRL SZ 6.5 (GLOVE) ×1
GLOVE BIOGEL PI IND STRL 7.5 (GLOVE) ×2 IMPLANT
GLOVE BIOGEL PI INDICATOR 7.5 (GLOVE) ×4
GOWN STRL REUS W/ TWL LRG LVL3 (GOWN DISPOSABLE) ×3 IMPLANT
GOWN STRL REUS W/ TWL XL LVL3 (GOWN DISPOSABLE) ×1 IMPLANT
GOWN STRL REUS W/TWL LRG LVL3 (GOWN DISPOSABLE) ×6
GOWN STRL REUS W/TWL XL LVL3 (GOWN DISPOSABLE) ×2
HANDPIECE INTERPULSE COAX TIP (DISPOSABLE)
KIT BASIN OR (CUSTOM PROCEDURE TRAY) ×3 IMPLANT
KIT ROOM TURNOVER OR (KITS) ×3 IMPLANT
MATRIX SURGICAL PSM 10X15CM (Tissue) ×3 IMPLANT
MICROMATRIX 1000MG (Tissue) ×3 IMPLANT
NS IRRIG 1000ML POUR BTL (IV SOLUTION) ×3 IMPLANT
PACK GENERAL/GYN (CUSTOM PROCEDURE TRAY) ×3 IMPLANT
PAD ARMBOARD 7.5X6 YLW CONV (MISCELLANEOUS) ×6 IMPLANT
SET HNDPC FAN SPRY TIP SCT (DISPOSABLE) IMPLANT
SOLUTION PARTIC MCRMTRX 1000MG (Tissue) ×1 IMPLANT
SPONGE GAUZE 4X4 12PLY (GAUZE/BANDAGES/DRESSINGS) IMPLANT
SUT VIC AB 5-0 P-3 18XBRD (SUTURE) ×2 IMPLANT
SUT VIC AB 5-0 P3 18 (SUTURE) ×4
TOWEL OR 17X24 6PK STRL BLUE (TOWEL DISPOSABLE) ×3 IMPLANT
TOWEL OR 17X26 10 PK STRL BLUE (TOWEL DISPOSABLE) ×3 IMPLANT
UNDERPAD 30X30 INCONTINENT (UNDERPADS AND DIAPERS) ×3 IMPLANT
WATER STERILE IRR 1000ML POUR (IV SOLUTION) IMPLANT

## 2014-01-14 NOTE — Progress Notes (Signed)
PROGRESS NOTE    [name] TKZ:[id number] DOB: December 26, 1945 DOA: 01/11/2014 PCP: Shirline Frees, MD  HPI/Brief narrative 68 year old female with history of DM 2, chronic kidney disease, chronic lower extremity venous stasis ulcers for which she follows with [facility], home health RN for dressing changes and [facility]. Her left leg wound had significantly improved until she got Pseudomonas infection and it worsened. She was started on oral doxycycline for/14/15 and was doing well during followup on 4/29. She then developed chills, stopped her antibiotics and had fevers. Admitted to hospital for further management.     Assessment/Plan:  1. Sepsis secondary to Left leg infected (pseudomonas) chronic venous stasis ulcer/cellulitis: Infectious disease and plastic surgery was consulted. Patient was empirically started on IV Zosyn and vancomycin. She underwent irrigation and debridement of left leg ulcer and placement of wound VAC in the OR on 5/6. She will need prolonged IV antibiotics. Awaiting tunneled central catheter placement by IR on 5/6 (no PICC secondary to chronic kidney disease). 2. DM 2: Continue home 70/30 insulin. DC'ed oral medications due to risk for hypoglycemia. Added NovoLog SSI. Fluctuating but reasonable inpatient control. 3. Chronic kidney disease: Baseline creatinine in the range of 1.7. Patient was able to pull up her labs on my chart on 5/5 and in December 2014, creatinine was 1.77 and hemoglobin 11 g per DL suggesting current creatinine probably is close to her baseline. She did not followup with Dr. Moshe Cipro, nephrology due to financial issues. Creatinine slightly elevated-? Prerenal. Brief IV fluids and follow BMP. 4. Anemia: Likely secondary to chronic kidney disease. Stable. Transfuse if hemoglobin less than 7 g per DL. Check ferritin. 5. Hypokalemia: Replace and follow BMP 6. Chronic venous stasis: Usually takes HCTZ and bumetanide at home. Her hyponatremia and  hypokalemia probably related to HCTZ. Diuretics currently on hold.   Code Status: Full Family Communication: Discussed with spouse and daughter at bedside. Disposition Plan: Remains inpatient   Consultants:  Infectious disease  Plastic surgery  Procedures:  None  Antibiotics:  IV Zosyn 5/3 >    IV vancomycin 5/3 >  Subjective: Seen after she had just arrived from the OR. Denied complaints.  Objective: Filed Vitals:   01/14/14 1000 01/14/14 1015 01/14/14 1020 01/14/14 1044  BP: 131/56 144/66  138/55  Pulse: 66 57  51  Temp:   98.5 F (36.9 C) 97.9 F (36.6 C)  TempSrc:    Oral  Resp: 19 15  16   Height:      Weight:      SpO2: 95% 99%  100%    Intake/Output Summary (Last 24 hours) at 01/14/14 1344 Last data filed at 01/14/14 0926  Gross per 24 hour  Intake    100 ml  Output    970 ml  Net   -870 ml   Filed Weights   01/11/14 1759 01/12/14 0038  Weight: 140.615 kg (310 lb) 142.575 kg (314 lb 5.1 oz)     Exam:  General exam: Pleasant middle-age female lying comfortably in bed. Respiratory system: Clear. No increased work of breathing. Cardiovascular system: S1 & S2 heard, RRR. No JVD, murmurs, gallops, clicks or pedal edema. Gastrointestinal system: Abdomen is nondistended, soft and nontender. Normal bowel sounds heard. Central nervous system: Alert and oriented. No focal neurological deficits. Extremities: Left leg dressing and wound wound VAC +   Data Reviewed: Basic Metabolic Panel:  Recent Labs Lab 01/11/14 1815 01/12/14 0805 01/12/14 1538 01/13/14 0423 01/14/14 0405  NA 139 131* 134* 132*  135*  K 4.6 3.1* 3.0* 3.2* 3.1*  CL 97 94* 95* 95* 98  CO2 25 22 22 20 21   GLUCOSE 104* 138* 102* 170* 94  BUN 10 37* 38* 37* 39*  CREATININE 0.98 1.93* 2.05* 1.98* 2.18*  CALCIUM 9.9 8.8 9.2 8.5 8.6   Liver Function Tests: No results found for this basename: AST, ALT, ALKPHOS, BILITOT, PROT, ALBUMIN,  in the last 168 hours No results found for  this basename: LIPASE, AMYLASE,  in the last 168 hours No results found for this basename: AMMONIA,  in the last 168 hours CBC:  Recent Labs Lab 01/11/14 1815 01/12/14 0530 01/12/14 0805 01/13/14 0423 01/14/14 0405  WBC 18.2* 11.3* 11.0* 7.8 6.8  NEUTROABS 17.0*  --   --   --   --   HGB 8.0* 7.6* 7.9* 7.5* 7.6*  HCT 23.3* 22.8* 23.6* 22.7* 22.3*  MCV 87.9 88.4 88.1 88.7 88.1  PLT 199 172 170 183 182   Cardiac Enzymes: No results found for this basename: CKTOTAL, CKMB, CKMBINDEX, TROPONINI,  in the last 168 hours BNP (last 3 results) No results found for this basename: PROBNP,  in the last 8760 hours CBG:  Recent Labs Lab 01/13/14 1749 01/13/14 2215 01/14/14 0614 01/14/14 0959 01/14/14 1214  GLUCAP 107* 78 108* 102* 146*    Recent Results (from the past 240 hour(s))  CULTURE, BLOOD (ROUTINE X 2)     Status: None   Collection Time    01/11/14 10:55 PM      Result Value Ref Range Status   Specimen Description BLOOD RIGHT ARM   Final   Special Requests BOTTLES DRAWN AEROBIC AND ANAEROBIC 10CC EACH   Final   Culture  Setup Time     Final   Value: 01/12/2014 09:38     Performed at [facility]   Culture     Final   Value:        BLOOD CULTURE RECEIVED NO GROWTH TO DATE CULTURE WILL BE HELD FOR 5 DAYS BEFORE ISSUING A FINAL NEGATIVE REPORT     Performed at [facility]   Report Status PENDING   Incomplete  CULTURE, BLOOD (ROUTINE X 2)     Status: None   Collection Time    01/11/14 11:08 PM      Result Value Ref Range Status   Specimen Description BLOOD LEFT HAND   Final   Special Requests BOTTLES DRAWN AEROBIC AND ANAEROBIC 5CC EACH   Final   Culture  Setup Time     Final   Value: 01/12/2014 09:38     Performed at [facility]   Culture     Final   Value:        BLOOD CULTURE RECEIVED NO GROWTH TO DATE CULTURE WILL BE HELD FOR 5 DAYS BEFORE ISSUING A FINAL NEGATIVE REPORT     Performed at [facility]   Report Status PENDING    Incomplete  URINE CULTURE     Status: None   Collection Time    01/12/14 12:54 AM      Result Value Ref Range Status   Specimen Description URINE, CLEAN CATCH   Final   Special Requests NONE   Final   Culture  Setup Time     Final   Value: 01/12/2014 09:56     Performed at [place]     Final   Value: NO GROWTH     Performed at  [facility]     Final   Value: NO GROWTH     Performed at [facility]   Report Status 01/13/2014 FINAL   Final  SURGICAL PCR SCREEN     Status: Abnormal   Collection Time    01/13/14  8:03 PM      Result Value Ref Range Status   MRSA, PCR POSITIVE (*) NEGATIVE Final   Staphylococcus aureus POSITIVE (*) NEGATIVE Final   Comment:            The Xpert SA Assay (FDA     approved for NASAL specimens     in patients over 27 years of age),     is one component of     a comprehensive surveillance     program.  Test performance has     been validated by [employer] for patients greater     than or equal to 62 year old.     It is not intended     to diagnose infection nor to     guide or monitor treatment.         Studies: No results found.      Scheduled Meds: . acetic acid   Irrigation Daily  . acidophilus  2 capsule Oral BID  . Chlorhexidine Gluconate Cloth  6 each Topical Q0600  . heparin  5,000 Units Subcutaneous 3 times per day  . HYDROmorphone      . insulin aspart  0-9 Units Subcutaneous TID WC  . insulin aspart protamine- aspart  80 Units Subcutaneous BID WC  . iron polysaccharides  150 mg Oral BID  . latanoprost  1 drop Both Eyes QHS  . mupirocin ointment  1 application Topical Daily  . mupirocin ointment  1 application Nasal BID  . piperacillin-tazobactam (ZOSYN)  IV  3.375 g Intravenous 3 times per day  . timolol  1 drop Both Eyes Daily  . vancomycin  2,000 mg Intravenous Q24H   Continuous Infusions:   Principal Problem:   Venous stasis ulcer of left lower  extremity Active Problems:   Type II or unspecified type diabetes mellitus with unspecified complication, uncontrolled   Sepsis   Ulcer of left lower leg   CKD (chronic kidney disease), stage III   Hypokalemia   Anemia   Diabetic leg ulcer    Time spent: 45 minutes.    Modena Jansky, MD, FACP, [facility]. [facility] Pager [phone]  If 7PM-7AM, please contact night-coverage [email or web address] Password [email or web address] 01/14/2014, 1:44 PM    LOS: 3 days

## 2014-01-14 NOTE — H&P (Signed)
[name] is an 68 y.o. female.   Chief Complaint: left leg ulcer HPI: The patient is a 68 yrs old wf here for treatment of a left leg chronic venous stasis ulcer.  She has been dealing with the wound for the past several years.  She was followed at the [facility] and then at [facility] most recently.  A culture in the past few weeks was positive for pseudomonas and doxy was started.  She developed cellulitis and was admitted for IV antibiotics.  The wound has gotten significantly worse over the past week.    Past Medical History  Diagnosis Date  . Venous insufficiency   . Venous stasis ulcer   . Diabetes mellitus due to underlying condition with diabetic nephropathy   . Neuropathy   . DJD (degenerative joint disease)   . Anemia   . Nephropathy, diabetic     Past Surgical History  Procedure Laterality Date  . Tonsilectomy, adenoidectomy, bilateral myringotomy and tubes    . Mouth surgery      Family History  Problem Relation Age of Onset  . Heart disease Mother   . Heart disease Father   . Diabetes Maternal Grandmother    Social History:  reports that she has never smoked. She has never used smokeless tobacco. She reports that she does not drink alcohol or use illicit drugs.  Allergies:  Allergies  Allergen Reactions  . Bactrim [Sulfamethoxazole-Tmp Ds] Shortness Of Breath    Depression based reactions, altered mentality  . Atorvastatin Other (See Comments)    aches  . Sulfamethoxazole-Trimethoprim Swelling  . Actos [Pioglitazone Hydrochloride] Other (See Comments)    unknown  . Celebrex [Celecoxib] Other (See Comments) and Rash    unknown  . Ibuprofen Other (See Comments) and Rash  . Latex Rash  . Neosporin [Neomycin-Bacitracin Zn-Polymyx] Rash  . Pioglitazone Rash    Medications Prior to Admission  Medication Sig Dispense Refill  . acetic acid 0.25 % irrigation Irrigate with as directed daily.      Marland Kitchen doxycycline (VIBRAMYCIN) 50 MG capsule Take 2 capsules (100 mg total)  by mouth 2 (two) times daily.  56 capsule  3  . glipiZIDE (GLUCOTROL) 10 MG tablet Take 10 mg by mouth 2 (two) times daily before a meal.      . hydrochlorothiazide 25 MG tablet Take 25 mg by mouth daily.       . hydrOXYzine (ATARAX/VISTARIL) 50 MG tablet Take 50 mg by mouth 3 (three) times daily as needed for itching.       . insulin NPH-insulin regular (NOVOLIN 70/30) (70-30) 100 UNIT/ML injection Inject 80 Units into the skin 2 (two) times daily with a meal.       . insulin regular (NOVOLIN R,HUMULIN R) 100 units/mL injection Inject 0-20 Units into the skin daily as needed for high blood sugar. Sliding scale      . iron polysaccharides (NIFEREX) 150 MG capsule Take 150 mg by mouth 2 (two) times daily.       Marland Kitchen lactobacillus acidophilus (BACID) TABS Take 2 tablets by mouth daily after breakfast.        . latanoprost (XALATAN) 0.005 % ophthalmic solution Place 1 drop into both eyes at bedtime.      Marland Kitchen liver oil-zinc oxide (DESITIN) 40 % ointment Apply 1 application topically daily.      . mupirocin ointment (BACTROBAN) 2 % Apply 1 application topically daily.      . potassium chloride (K-DUR,KLOR-CON) 10 MEQ tablet Take 10  mEq by mouth 2 (two) times daily.      . timolol (TIMOPTIC) 0.5 % ophthalmic solution Place 1 drop into both eyes daily.      . traMADol (ULTRAM) 50 MG tablet Take 50 mg by mouth every 6 (six) hours as needed for moderate pain.       . vitamin C (ASCORBIC ACID) 500 MG tablet Take 500 mg by mouth 2 (two) times daily.       . bumetanide (BUMEX) 2 MG tablet Take 2 mg by mouth 2 (two) times daily.        . COD LIVER OIL PO Take 1,000 mg by mouth daily.          Results for orders placed during the hospital encounter of 01/11/14 (from the past 48 hour(s))  GLUCOSE, CAPILLARY     Status: Abnormal   Collection Time    01/12/14 12:24 PM      Result Value Ref Range   Glucose-Capillary 137 (*) 70 - 99 mg/dL  BASIC METABOLIC PANEL     Status: Abnormal   Collection Time    01/12/14   3:38 PM      Result Value Ref Range   Sodium 134 (*) 137 - 147 mEq/L   Potassium 3.0 (*) 3.7 - 5.3 mEq/L   Chloride 95 (*) 96 - 112 mEq/L   CO2 22  19 - 32 mEq/L   Glucose, Bld 102 (*) 70 - 99 mg/dL   BUN 38 (*) 6 - 23 mg/dL   Creatinine, Ser 2.05 (*) 0.50 - 1.10 mg/dL   Calcium 9.2  8.4 - 10.5 mg/dL   GFR calc non Af Amer 24 (*) >90 mL/min   GFR calc Af Amer 27 (*) >90 mL/min   Comment: (NOTE)     The eGFR has been calculated using the CKD EPI equation.     This calculation has not been validated in all clinical situations.     eGFR's persistently <90 mL/min signify possible Chronic Kidney     Disease.  GLUCOSE, CAPILLARY     Status: None   Collection Time    01/12/14  5:56 PM      Result Value Ref Range   Glucose-Capillary 98  70 - 99 mg/dL  GLUCOSE, CAPILLARY     Status: Abnormal   Collection Time    01/12/14  9:53 PM      Result Value Ref Range   Glucose-Capillary 148 (*) 70 - 99 mg/dL  GLUCOSE, CAPILLARY     Status: Abnormal   Collection Time    01/13/14 12:55 AM      Result Value Ref Range   Glucose-Capillary 134 (*) 70 - 99 mg/dL   Comment 1 Notify RN    BASIC METABOLIC PANEL     Status: Abnormal   Collection Time    01/13/14  4:23 AM      Result Value Ref Range   Sodium 132 (*) 137 - 147 mEq/L   Potassium 3.2 (*) 3.7 - 5.3 mEq/L   Chloride 95 (*) 96 - 112 mEq/L   CO2 20  19 - 32 mEq/L   Glucose, Bld 170 (*) 70 - 99 mg/dL   BUN 37 (*) 6 - 23 mg/dL   Creatinine, Ser 1.98 (*) 0.50 - 1.10 mg/dL   Calcium 8.5  8.4 - 10.5 mg/dL   GFR calc non Af Amer 25 (*) >90 mL/min   GFR calc Af Amer 28 (*) >90 mL/min   Comment: (NOTE)       The eGFR has been calculated using the CKD EPI equation.     This calculation has not been validated in all clinical situations.     eGFR's persistently <90 mL/min signify possible Chronic Kidney     Disease.  CBC     Status: Abnormal   Collection Time    01/13/14  4:23 AM      Result Value Ref Range   WBC 7.8  4.0 - 10.5 K/uL   RBC  2.56 (*) 3.87 - 5.11 MIL/uL   Hemoglobin 7.5 (*) 12.0 - 15.0 g/dL   HCT 22.7 (*) 36.0 - 46.0 %   MCV 88.7  78.0 - 100.0 fL   MCH 29.3  26.0 - 34.0 pg   MCHC 33.0  30.0 - 36.0 g/dL   RDW 17.3 (*) 11.5 - 15.5 %   Platelets 183  150 - 400 K/uL  GLUCOSE, CAPILLARY     Status: Abnormal   Collection Time    01/13/14  7:35 AM      Result Value Ref Range   Glucose-Capillary 174 (*) 70 - 99 mg/dL  GLUCOSE, CAPILLARY     Status: Abnormal   Collection Time    01/13/14 11:45 AM      Result Value Ref Range   Glucose-Capillary 209 (*) 70 - 99 mg/dL  GLUCOSE, CAPILLARY     Status: Abnormal   Collection Time    01/13/14  5:49 PM      Result Value Ref Range   Glucose-Capillary 107 (*) 70 - 99 mg/dL  SURGICAL PCR SCREEN     Status: Abnormal   Collection Time    01/13/14  8:03 PM      Result Value Ref Range   MRSA, PCR POSITIVE (*) NEGATIVE   Staphylococcus aureus POSITIVE (*) NEGATIVE   Comment:            The Xpert SA Assay (FDA     approved for NASAL specimens     in patients over 21 years of age),     is one component of     a comprehensive surveillance     program.  Test performance has     been validated by [facility] for patients greater     than or equal to 1 year old.     It is not intended     to diagnose infection nor to     guide or monitor treatment.  GLUCOSE, CAPILLARY     Status: None   Collection Time    01/13/14 10:15 PM      Result Value Ref Range   Glucose-Capillary 78  70 - 99 mg/dL   Comment 1 Notify RN    CBC     Status: Abnormal   Collection Time    01/14/14  4:05 AM      Result Value Ref Range   WBC 6.8  4.0 - 10.5 K/uL   RBC 2.53 (*) 3.87 - 5.11 MIL/uL   Hemoglobin 7.6 (*) 12.0 - 15.0 g/dL   HCT 22.3 (*) 36.0 - 46.0 %   MCV 88.1  78.0 - 100.0 fL   MCH 30.0  26.0 - 34.0 pg   MCHC 34.1  30.0 - 36.0 g/dL   RDW 17.2 (*) 11.5 - 15.5 %   Platelets 182  150 - 400 K/uL  BASIC METABOLIC PANEL     Status: Abnormal   Collection Time    01/14/14  4:05 AM         Result Value Ref Range   Sodium 135 (*) 137 - 147 mEq/L   Potassium 3.1 (*) 3.7 - 5.3 mEq/L   Chloride 98  96 - 112 mEq/L   CO2 21  19 - 32 mEq/L   Glucose, Bld 94  70 - 99 mg/dL   BUN 39 (*) 6 - 23 mg/dL   Creatinine, Ser 2.18 (*) 0.50 - 1.10 mg/dL   Calcium 8.6  8.4 - 10.5 mg/dL   GFR calc non Af Amer 22 (*) >90 mL/min   GFR calc Af Amer 25 (*) >90 mL/min   Comment: (NOTE)     The eGFR has been calculated using the CKD EPI equation.     This calculation has not been validated in all clinical situations.     eGFR's persistently <90 mL/min signify possible Chronic Kidney     Disease.  PROTIME-INR     Status: Abnormal   Collection Time    01/14/14  4:05 AM      Result Value Ref Range   Prothrombin Time 15.6 (*) 11.6 - 15.2 seconds   INR 1.27  0.00 - 1.49  GLUCOSE, CAPILLARY     Status: Abnormal   Collection Time    01/14/14  6:14 AM      Result Value Ref Range   Glucose-Capillary 108 (*) 70 - 99 mg/dL   No results found.  Review of Systems  Constitutional: Negative.   HENT: Negative.   Eyes: Negative.   Respiratory: Negative.   Cardiovascular: Negative.   Gastrointestinal: Negative.   Genitourinary: Negative.   Musculoskeletal: Negative.   Skin: Negative.   Psychiatric/Behavioral: Negative.     Blood pressure 146/47, pulse 66, temperature 97.4 F (36.3 C), temperature source Oral, resp. rate 18, height 5' 7" (1.702 m), weight 142.575 kg (314 lb 5.1 oz), SpO2 96.00%. Physical Exam  Constitutional: She appears well-developed and well-nourished.  HENT:  Head: Normocephalic and atraumatic.  Eyes: Conjunctivae and EOM are normal. Pupils are equal, round, and reactive to light.  Neck: Normal range of motion.  Respiratory: Effort normal.  GI: Soft.  Neurological: She is alert.  Skin: Skin is warm.  Psychiatric: She has a normal mood and affect. Her behavior is normal. Judgment and thought content normal.     Assessment/Plan Irrigation and debridement with  placement of Acell and the VAC.  Risks and complications were discussed.  Claire Sanger 01/14/2014, 8:17 AM    

## 2014-01-14 NOTE — Sedation Documentation (Signed)
Patient denies pain and is resting comfortably.  

## 2014-01-14 NOTE — Op Note (Signed)
Operative Note   DATE OF OPERATION: 01/14/2014  LOCATION: [facility]  SURGICAL DIVISION: Plastic Surgery  PREOPERATIVE DIAGNOSES:  Left leg chronic venous stasis ulcer (10 x 12 x 1 cm)  POSTOPERATIVE DIAGNOSES:  same  PROCEDURE:  Preparation of left leg ulcer for placement of Acell (10 x 15 cmsheet and 1gm powder) and the VAC after debridement of skin, subcutaneous tissue and fibrous tissue.  SURGEON: Theodoro Kos, DO  ASSISTANT: Shawn Rayburn, PA  ANESTHESIA:  General.   COMPLICATIONS: None.   INDICATIONS FOR PROCEDURE:  The patient, [name], is a 68 y.o. female born on Dec 16, 1945, is here for treatment of a left leg chronic venous insufficiency ulcer.   CONSENT:  Informed consent was obtained directly from the patient. Risks, benefits and alternatives were fully discussed. Specific risks including but not limited to bleeding, infection, hematoma, seroma, scarring, pain, implant infection, implant extrusion, capsular contracture, asymmetry, wound healing problems, and need for further surgery were all discussed. The patient did have an ample opportunity to have questions answered to satisfaction.   DESCRIPTION OF PROCEDURE:  The patient was taken to the operating room. SCDs were placed and IV antibiotics were given. The patient's operative site was prepped and draped in a sterile fashion. A time out was performed and all information was confirmed to be correct.  General anesthesia was administered.  The leg was sharply debrided with a #10 blade and a currette.  The area was irrigated with antibiotic solution.  The Acell powder was placed followed by the sheet after it was prepared in saline.  The adaptic was applied and the surgical lube.  The VAC was placed and secured, set at 125 mmHg continuous pressure and had an excellent seal. Kerlex and an ACE were used.  The patient tolerated the procedure well.  There were no complications. The patient was allowed to wake from  anesthesia, extubated and taken to the recovery room in satisfactory condition.

## 2014-01-14 NOTE — Anesthesia Postprocedure Evaluation (Signed)
  Anesthesia Post-op Note  Patient: [name]  Procedure(s) Performed: Procedure(s): IRRIGATION AND DEBRIDEMENT LEFT LOWER EXTREMITY POSSIBLE ACELL AND POSSIBLE VAC PLACEMENT (Left)  Patient Location: PACU  Anesthesia Type:General  Level of Consciousness: awake, alert  and oriented  Airway and Oxygen Therapy: Patient Spontanous Breathing and Patient connected to nasal cannula oxygen  Post-op Pain: mild  Post-op Assessment: Post-op Vital signs reviewed, Patient's Cardiovascular Status Stable, Respiratory Function Stable, Patent Airway and Pain level controlled  Post-op Vital Signs: stable  Last Vitals:  Filed Vitals:   01/14/14 1501  BP: 127/52  Pulse: 59  Temp: 36.7 C  Resp: 16    Complications: No apparent anesthesia complications

## 2014-01-14 NOTE — Anesthesia Procedure Notes (Signed)
Procedure Name: Intubation Date/Time: 01/14/2014 8:50 AM Performed by: Storm Frisk E Pre-anesthesia Checklist: Patient identified, Timeout performed, Emergency Drugs available, Suction available and Patient being monitored Patient Re-evaluated:Patient Re-evaluated prior to inductionOxygen Delivery Method: Circle system utilized Preoxygenation: Pre-oxygenation with 100% oxygen Intubation Type: IV induction Ventilation: Mask ventilation without difficulty Laryngoscope Size: Mac and 3 Tube type: Oral Tube size: 7.0 mm Number of attempts: 1 Airway Equipment and Method: Stylet Placement Confirmation: ETT inserted through vocal cords under direct vision,  positive ETCO2 and breath sounds checked- equal and bilateral Secured at: 23 cm Tube secured with: Tape Dental Injury: Teeth and Oropharynx as per pre-operative assessment  Comments: Pt. Edentulous- lips as per pre-op condition

## 2014-01-14 NOTE — Consult Note (Signed)
Agree with the above note 

## 2014-01-14 NOTE — Brief Op Note (Signed)
01/11/2014 - 01/14/2014  10:41 AM  PATIENT:  [name]  68 y.o. female  PRE-OPERATIVE DIAGNOSIS:  chronic foot ulcer  POST-OPERATIVE DIAGNOSIS:  chronic foot ulcer  PROCEDURE:  Procedure(s): IRRIGATION AND DEBRIDEMENT LEFT LOWER EXTREMITY POSSIBLE ACELL AND POSSIBLE VAC PLACEMENT (Left)  SURGEON:  Surgeon(s) and Role:    * Massie Cogliano Sanger, DO - Primary  PHYSICIAN ASSISTANT: Shawn Rayburn, PA  ASSISTANTS: none   ANESTHESIA:   general  EBL:  Total I/O In: 100 [I.V.:100] Out: 220 [Urine:200; Blood:20]  BLOOD ADMINISTERED:none  DRAINS: none   LOCAL MEDICATIONS USED:  NONE  SPECIMEN:  No Specimen  DISPOSITION OF SPECIMEN:  N/A  COUNTS:  YES  TOURNIQUET:  * No tourniquets in log *  DICTATION: .Dragon Dictation  PLAN OF CARE: Admit to inpatient   PATIENT DISPOSITION:  PACU - hemodynamically stable.   Delay start of Pharmacological VTE agent (>24hrs) due to surgical blood loss or risk of bleeding: no

## 2014-01-14 NOTE — Discharge Instructions (Signed)
Keep VAC at 125 mmHg pressure

## 2014-01-14 NOTE — Transfer of Care (Signed)
Immediate Anesthesia Transfer of Care Note  Patient: [name]  Procedure(s) Performed: Procedure(s): IRRIGATION AND DEBRIDEMENT LEFT LOWER EXTREMITY POSSIBLE ACELL AND POSSIBLE VAC PLACEMENT (Left)  Patient Location: PACU  Anesthesia Type:General  Level of Consciousness: awake, alert  and oriented  Airway & Oxygen Therapy: Patient Spontanous Breathing and Patient connected to nasal cannula oxygen  Post-op Assessment: Report given to PACU RN and Post -op Vital signs reviewed and stable  Post vital signs: Reviewed and stable  Complications: No apparent anesthesia complications

## 2014-01-15 DIAGNOSIS — D649 Anemia, unspecified: Secondary | ICD-10-CM

## 2014-01-15 DIAGNOSIS — I4891 Unspecified atrial fibrillation: Secondary | ICD-10-CM

## 2014-01-15 DIAGNOSIS — I480 Paroxysmal atrial fibrillation: Secondary | ICD-10-CM | POA: Diagnosis present

## 2014-01-15 LAB — BASIC METABOLIC PANEL
BUN: 32 mg/dL — ABNORMAL HIGH (ref 6–23)
CALCIUM: 8.6 mg/dL (ref 8.4–10.5)
CO2: 19 mEq/L (ref 19–32)
Chloride: 101 mEq/L (ref 96–112)
Creatinine, Ser: 1.93 mg/dL — ABNORMAL HIGH (ref 0.50–1.10)
GFR, EST AFRICAN AMERICAN: 29 mL/min — AB (ref >90)
GFR, EST NON AFRICAN AMERICAN: 25 mL/min — AB (ref >90)
Glucose, Bld: 164 mg/dL — ABNORMAL HIGH (ref 70–99)
POTASSIUM: 4 meq/L (ref 3.7–5.3)
Sodium: 136 mEq/L — ABNORMAL LOW (ref 137–147)

## 2014-01-15 LAB — GLUCOSE, CAPILLARY
GLUCOSE-CAPILLARY: 191 mg/dL — AB (ref 70–99)
Glucose-Capillary: 208 mg/dL — ABNORMAL HIGH (ref 70–99)
Glucose-Capillary: 160 mg/dL — ABNORMAL HIGH (ref 70–99)
Glucose-Capillary: 93 mg/dL (ref 70–99)

## 2014-01-15 LAB — CBC
HCT: 22.5 % — ABNORMAL LOW (ref 36.0–46.0)
Hemoglobin: 7.3 g/dL — ABNORMAL LOW (ref 12.0–15.0)
MCH: 29.3 pg (ref 26.0–34.0)
MCHC: 32.4 g/dL (ref 30.0–36.0)
MCV: 90.4 fL (ref 78.0–100.0)
Platelets: 193 10*3/uL (ref 150–400)
RBC: 2.49 MIL/uL — ABNORMAL LOW (ref 3.87–5.11)
RDW: 17.5 % — ABNORMAL HIGH (ref 11.5–15.5)
WBC: 4.6 10*3/uL (ref 4.0–10.5)

## 2014-01-15 LAB — TSH: TSH: 4.02 u[IU]/mL (ref 0.350–4.500)

## 2014-01-15 LAB — VANCOMYCIN, TROUGH: Vancomycin Tr: 28.2 ug/mL (ref 10.0–20.0)

## 2014-01-15 MED ORDER — VANCOMYCIN HCL 10 G IV SOLR
1250.0000 mg | INTRAVENOUS | Status: DC
  Administered 2014-01-15 – 2014-01-17 (×3): 1250 mg via INTRAVENOUS
  Filled 2014-01-15 (×4): qty 1250

## 2014-01-15 MED ORDER — DEXTROSE 5 % IV SOLN
2.0000 g | INTRAVENOUS | Status: DC
  Administered 2014-01-15 – 2014-01-17 (×3): 2 g via INTRAVENOUS
  Filled 2014-01-15 (×3): qty 2

## 2014-01-15 MED ORDER — ACETAMINOPHEN 325 MG PO TABS
650.0000 mg | ORAL_TABLET | ORAL | Status: DC | PRN
  Administered 2014-01-15 – 2014-01-17 (×4): 650 mg via ORAL
  Filled 2014-01-15 (×4): qty 2

## 2014-01-15 NOTE — Progress Notes (Signed)
INFECTIOUS DISEASE PROGRESS NOTE  ID: [name] is a 68 y.o. female with  Principal Problem:   Venous stasis ulcer of left lower extremity Active Problems:   Type II or unspecified type diabetes mellitus with unspecified complication, uncontrolled   Sepsis   Ulcer of left lower leg   CKD (chronic kidney disease), stage III   Hypokalemia   Anemia   Diabetic leg ulcer   Renal failure, acute on chronic  Subjective: Without complaints  Abtx:  Anti-infectives   Start     Dose/Rate Route Frequency Ordered Stop   01/14/14 0925  polymyxin B 500,000 Units, bacitracin 50,000 Units in sodium chloride irrigation 0.9 % 500 mL irrigation  Status:  Discontinued       As needed 01/14/14 0926 01/14/14 0940   01/12/14 1600  vancomycin (VANCOCIN) 2,000 mg in sodium chloride 0.9 % 500 mL IVPB     2,000 mg 250 mL/hr over 120 Minutes Intravenous Every 24 hours 01/12/14 1359     01/12/14 0600  piperacillin-tazobactam (ZOSYN) IVPB 3.375 g     3.375 g 12.5 mL/hr over 240 Minutes Intravenous 3 times per day 01/11/14 2344     01/11/14 2230  vancomycin (VANCOCIN) 2,000 mg in sodium chloride 0.9 % 500 mL IVPB  Status:  Discontinued     2,000 mg 250 mL/hr over 120 Minutes Intravenous  Once 01/11/14 2224 01/11/14 2247   01/11/14 2230  piperacillin-tazobactam (ZOSYN) IVPB 3.375 g     3.375 g 100 mL/hr over 30 Minutes Intravenous  Once 01/11/14 2224 01/11/14 2340      Medications:  Scheduled: . acetic acid   Irrigation Daily  . acidophilus  2 capsule Oral BID  . Chlorhexidine Gluconate Cloth  6 each Topical Q0600  . heparin  5,000 Units Subcutaneous 3 times per day  . insulin aspart  0-9 Units Subcutaneous TID WC  . insulin aspart protamine- aspart  80 Units Subcutaneous BID WC  . iron polysaccharides  150 mg Oral BID  . latanoprost  1 drop Both Eyes QHS  . mupirocin ointment  1 application Topical Daily  . mupirocin ointment  1 application Nasal BID  . piperacillin-tazobactam (ZOSYN)  IV   3.375 g Intravenous 3 times per day  . timolol  1 drop Both Eyes Daily  . vancomycin  2,000 mg Intravenous Q24H    Objective: Vital signs in last 24 hours: Temp:  [97.4 F (36.3 C)-98.4 F (36.9 C)] 97.4 F (36.3 C) (05/07 0543) Pulse Rate:  [59-66] 66 (05/07 0543) Resp:  [15-18] 16 (05/07 0543) BP: (121-143)/(52-96) 138/96 mmHg (05/07 0543) SpO2:  [93 %-100 %] 93 % (05/07 0543)   General appearance: alert, cooperative and no distress Chest wall: R chest catheter is clean, non-tender.  Extremities: LLE wrapped.   Lab Results  Recent Labs  01/14/14 0405 01/15/14 0455  WBC 6.8 4.6  HGB 7.6* 7.3*  HCT 22.3* 22.5*  NA 135* 136*  K 3.1* 4.0  CL 98 101  CO2 21 19  BUN 39* 32*  CREATININE 2.18* 1.93*   Liver Panel No results found for this basename: PROT, ALBUMIN, AST, ALT, ALKPHOS, BILITOT, BILIDIR, IBILI,  in the last 72 hours Sedimentation Rate No results found for this basename: ESRSEDRATE,  in the last 72 hours C-Reactive Protein No results found for this basename: CRP,  in the last 72 hours  Microbiology: Recent Results (from the past 240 hour(s))  CULTURE, BLOOD (ROUTINE X 2)     Status: None  Collection Time    01/11/14 10:55 PM      Result Value Ref Range Status   Specimen Description BLOOD RIGHT ARM   Final   Special Requests BOTTLES DRAWN AEROBIC AND ANAEROBIC 10CC EACH   Final   Culture  Setup Time     Final   Value: 01/12/2014 09:38     Performed at [facility]   Culture     Final   Value:        BLOOD CULTURE RECEIVED NO GROWTH TO DATE CULTURE WILL BE HELD FOR 5 DAYS BEFORE ISSUING A FINAL NEGATIVE REPORT     Performed at [facility]   Report Status PENDING   Incomplete  CULTURE, BLOOD (ROUTINE X 2)     Status: None   Collection Time    01/11/14 11:08 PM      Result Value Ref Range Status   Specimen Description BLOOD LEFT HAND   Final   Special Requests BOTTLES DRAWN AEROBIC AND ANAEROBIC 5CC EACH   Final   Culture  Setup Time      Final   Value: 01/12/2014 09:38     Performed at [facility]   Culture     Final   Value:        BLOOD CULTURE RECEIVED NO GROWTH TO DATE CULTURE WILL BE HELD FOR 5 DAYS BEFORE ISSUING A FINAL NEGATIVE REPORT     Performed at [facility]   Report Status PENDING   Incomplete  URINE CULTURE     Status: None   Collection Time    01/12/14 12:54 AM      Result Value Ref Range Status   Specimen Description URINE, CLEAN CATCH   Final   Special Requests NONE   Final   Culture  Setup Time     Final   Value: 01/12/2014 09:56     Performed at [place]     Final   Value: NO GROWTH     Performed at [facility]   Culture     Final   Value: NO GROWTH     Performed at [facility]   Report Status 01/13/2014 FINAL   Final  SURGICAL PCR SCREEN     Status: Abnormal   Collection Time    01/13/14  8:03 PM      Result Value Ref Range Status   MRSA, PCR POSITIVE (*) NEGATIVE Final   Staphylococcus aureus POSITIVE (*) NEGATIVE Final   Comment:            The Xpert SA Assay (FDA     approved for NASAL specimens     in patients over 39 years of age),     is one component of     a comprehensive surveillance     program.  Test performance has     been validated by [employer] for patients greater     than or equal to 10 year old.     It is not intended     to diagnose infection nor to     guide or monitor treatment.    Studies/Results: Ir Fluoro Guide Cv Line Right  01/14/2014   CLINICAL DATA:  Pseudomonas infection, needs venous access. Chronic kidney disease.  EXAM: RIGHT IJ CENTRAL VENOUS CATHETER PLACEMENT UNDER ULTRASOUND AND FLUOROSCOPIC GUIDANCE:  FLUOROSCOPY TIME:  6 seconds  TECHNIQUE: The procedure, risks (including but not limited to  bleeding, infection, organ damage ), benefits, and alternatives were explained to the patient. Questions regarding the procedure were encouraged and answered. The patient understands and  consents to the procedure. Patency of the right IJ vein was confirmed with ultrasound with image documentation. An appropriate skin site was determined. Skin site was marked. Region was prepped using maximum barrier technique including cap and mask, sterile gown, sterile gloves, large sterile sheet, and Chlorhexidine as cutaneous antisepsis. The region was infiltrated locally with 1% lidocaine. Under real-time ultrasound guidance, the right IJ vein was accessed with a 21 gauge micropuncture needle; the needle tip within the vein was confirmed with ultrasound image documentation. The needle exchanged over a guidewire for peel-away  sheath through which an 24cm 5 [place] PowerPICC Single lumen catheter was  advanced. This was positioned with the tip at the cavoatrial junction. Spot  chest radiograph shows good positioning and no pneumothorax. Catheter was flushed  and sutured externally with 0-Prolene sutures. Patient tolerated the procedure  well, with no immediate complication.  IMPRESSION: 1. Technically successful right IJ Single lumenpower injectable central venous catheter placement.   Electronically Signed   By: Arne Cleveland M.D.   On: 01/14/2014 14:59   Ir US Guide Vasc Access Right  01/14/2014   CLINICAL DATA:  Pseudomonas infection, needs venous access. Chronic kidney disease.  EXAM: RIGHT IJ CENTRAL VENOUS CATHETER PLACEMENT UNDER ULTRASOUND AND FLUOROSCOPIC GUIDANCE:  FLUOROSCOPY TIME:  6 seconds  TECHNIQUE: The procedure, risks (including but not limited to bleeding, infection, organ damage ), benefits, and alternatives were explained to the patient. Questions regarding the procedure were encouraged and answered. The patient understands and consents to the procedure. Patency of the right IJ vein was confirmed with ultrasound with image documentation. An appropriate skin site was determined. Skin site was marked. Region was prepped using maximum barrier technique including cap and mask, sterile gown,  sterile gloves, large sterile sheet, and Chlorhexidine as cutaneous antisepsis. The region was infiltrated locally with 1% lidocaine. Under real-time ultrasound guidance, the right IJ vein was accessed with a 21 gauge micropuncture needle; the needle tip within the vein was confirmed with ultrasound image documentation. The needle exchanged over a guidewire for peel-away  sheath through which an 24cm 5 [place] PowerPICC Single lumen catheter was  advanced. This was positioned with the tip at the cavoatrial junction. Spot  chest radiograph shows good positioning and no pneumothorax. Catheter was flushed  and sutured externally with 0-Prolene sutures. Patient tolerated the procedure  well, with no immediate complication.  IMPRESSION: 1. Technically successful right IJ Single lumenpower injectable central venous catheter placement.   Electronically Signed   By: Arne Cleveland M.D.   On: 01/14/2014 14:59     Assessment/Plan: LLE ulcer  DM  Sepsis  Prev pseudomonas (R- aztreonam, cipro)  Total days of antibiotics: 4 in hospital (vanco/zosyn) Will change her zosyn to cefepime, can get qday with her CrCl.  Will have her f/u in ID clinic.          [facility] (pager) [phone] [email or web address] 01/15/2014, 11:48 AM  LOS: 4 days   **Disclaimer: This note may have been dictated with voice recognition software. Similar sounding words can inadvertently be transcribed and this note may contain transcription errors which may not have been corrected upon publication of note.**

## 2014-01-15 NOTE — Progress Notes (Addendum)
PROGRESS NOTE    [name] GMW:[id number] DOB: [birth year] DOA: 01/11/2014 PCP: Shirline Frees, MD  HPI/Brief narrative 68 year old female with history of DM 2, chronic kidney disease, chronic lower extremity venous stasis ulcers for which she follows with [facility], home health RN for dressing changes and RCID. Her left leg wound had significantly improved until she got Pseudomonas infection and it worsened. She was started on oral doxycycline for/14/15 and was doing well during followup on 4/29. She then developed chills, stopped her antibiotics and had fevers. Infectious disease and plastic surgery were consulted. Patient underwent I & D with wound VAC placement on 5/6. Tunneled central line placed for prolonged IV antibiotics. If hemoglobin and creatinine are stable, possible discharge in 24 hours, pending clearance by consultants.    Assessment/Plan:  1. Sepsis secondary to Left leg infected (pseudomonas) chronic venous stasis ulcer/cellulitis: Infectious disease and plastic surgery was consulted. Patient was empirically started on IV Zosyn and vancomycin. She underwent irrigation and debridement of left leg ulcer and placement of wound VAC in the OR on 5/6. She will need prolonged IV antibiotics.  status post tunneled central line placement 5/6 (no PICC secondary to chronic kidney disease). Await ID recommendation regarding choice and duration of home antibiotics. Requesting DC of Tramadol and wishes to take Tylenol only for pain. 2. DM 2: Continue home 70/30 insulin. DC'ed oral medications due to risk for hypoglycemia. Added NovoLog SSI. Fluctuating but reasonable inpatient control. 3. Chronic kidney disease: Baseline creatinine in the range of 1.7. Patient was able to pull up her labs on my chart on 5/5 and in December 2014, creatinine was 1.77 and hemoglobin 11 g per DL suggesting current creatinine probably is close to her baseline. She did not followup with Dr. Moshe Cipro,  nephrology due to financial issues. Creatinine slightly elevated-? Prerenal-improved after brief IV fluids. Patient seems volume overloaded. DC IV fluids and follow BMP in a.m. We'll need to followup with nephrology as outpatient.  4. Anemia: Likely secondary to chronic kidney disease and iron deficiency . Stable. Transfuse if hemoglobin less than 7 g per DL. Check ferritin:121 . Patient states that she had not taken her ferritin for 2 weeks while on oral doxycycline.  5. Hypokalemia: Replaced and follow BMP 6. Chronic venous stasis: Usually takes HCTZ and bumetanide at home. Her hyponatremia and hypokalemia probably related to HCTZ. Diuretics currently on hold. Consider resuming diuretics at discharge.  7. Diarrhea: Probably related to antibiotics. Continue probiotics.? Antimotility agents.  8. A. fib: Patient denies prior history of A. fib diagnosis, HTN or strokes. She does give history of transient fluttering of heart which lasted no more than a couple seconds. Will repeat EKG. If A. fib, then probably PAF and CVR. CHA2DS2-VASc score: 3 and she may need to be on oral anticoagulants.   Code Status: Full Family Communication:  none at bedside.  Disposition Plan:  possible discharge home in 24 hours. Will need home health services for management of IV antibiotics and left leg wound VAC .   Consultants:  Infectious disease  Plastic surgery  Interventional radiology  Procedures:  I&D of left leg wound on 5/6  Right IJ tunneled central catheter placement 5/6   Antibiotics:  IV Zosyn 5/3 >    IV vancomycin 5/3 >  Subjective:  diarrhea. Denies left leg pain.   Objective: Filed Vitals:   01/14/14 1501 01/14/14 1747 01/14/14 2103 01/15/14 0543  BP: 127/52 124/64 141/58 138/96  Pulse: 59 62 59 66  Temp:  98 F (36.7 C) 98.4 F (36.9 C) 97.9 F (36.6 C) 97.4 F (36.3 C)  TempSrc: Oral Oral Oral   Resp: 16 16 17 16   Height:      Weight:      SpO2: 94% 96% 96% 93%     Intake/Output Summary (Last 24 hours) at 01/15/14 1108 Last data filed at 01/15/14 1006  Gross per 24 hour  Intake 1933.75 ml  Output   1750 ml  Net 183.75 ml   Filed Weights   01/11/14 1759 01/12/14 0038  Weight: 140.615 kg (310 lb) 142.575 kg (314 lb 5.1 oz)     Exam:  General exam: Pleasant middle-age female lying comfortably in bed. Respiratory system: Clear. No increased work of breathing. Cardiovascular system: S1 & S2 heard, RRR. No JVD, murmurs, gallops, clicks or pedal edema. Tunneled central line right upper anterior chest.  Gastrointestinal system: Abdomen is nondistended, soft and nontender. Normal bowel sounds heard. Central nervous system: Alert and oriented. No focal neurological deficits. Extremities: Left leg dressing and wound wound VAC +   Data Reviewed: Basic Metabolic Panel:  Recent Labs Lab 01/12/14 0805 01/12/14 1538 01/13/14 0423 01/14/14 0405 01/14/14 1508 01/15/14 0455  NA 131* 134* 132* 135*  --  136*  K 3.1* 3.0* 3.2* 3.1*  --  4.0  CL 94* 95* 95* 98  --  101  CO2 22 22 20 21   --  19  GLUCOSE 138* 102* 170* 94  --  164*  BUN 37* 38* 37* 39*  --  32*  CREATININE 1.93* 2.05* 1.98* 2.18*  --  1.93*  CALCIUM 8.8 9.2 8.5 8.6  --  8.6  MG  --   --   --   --  2.5  --    Liver Function Tests: No results found for this basename: AST, ALT, ALKPHOS, BILITOT, PROT, ALBUMIN,  in the last 168 hours No results found for this basename: LIPASE, AMYLASE,  in the last 168 hours No results found for this basename: AMMONIA,  in the last 168 hours CBC:  Recent Labs Lab 01/11/14 1815 01/12/14 0530 01/12/14 0805 01/13/14 0423 01/14/14 0405 01/15/14 0455  WBC 18.2* 11.3* 11.0* 7.8 6.8 4.6  NEUTROABS 17.0*  --   --   --   --   --   HGB 8.0* 7.6* 7.9* 7.5* 7.6* 7.3*  HCT 23.3* 22.8* 23.6* 22.7* 22.3* 22.5*  MCV 87.9 88.4 88.1 88.7 88.1 90.4  PLT 199 172 170 183 182 193   Cardiac Enzymes: No results found for this basename: CKTOTAL, CKMB,  CKMBINDEX, TROPONINI,  in the last 168 hours BNP (last 3 results) No results found for this basename: PROBNP,  in the last 8760 hours CBG:  Recent Labs Lab 01/14/14 0959 01/14/14 1214 01/14/14 1719 01/14/14 2214 01/15/14 0746  GLUCAP 102* 146* 135* 180* 191*    Recent Results (from the past 240 hour(s))  CULTURE, BLOOD (ROUTINE X 2)     Status: None   Collection Time    01/11/14 10:55 PM      Result Value Ref Range Status   Specimen Description BLOOD RIGHT ARM   Final   Special Requests BOTTLES DRAWN AEROBIC AND ANAEROBIC 10CC EACH   Final   Culture  Setup Time     Final   Value: 01/12/2014 09:38     Performed at [place]     Final   Value:        BLOOD CULTURE RECEIVED  NO GROWTH TO DATE CULTURE WILL BE HELD FOR 5 DAYS BEFORE ISSUING A FINAL NEGATIVE REPORT     Performed at [facility]   Report Status PENDING   Incomplete  CULTURE, BLOOD (ROUTINE X 2)     Status: None   Collection Time    01/11/14 11:08 PM      Result Value Ref Range Status   Specimen Description BLOOD LEFT HAND   Final   Special Requests BOTTLES DRAWN AEROBIC AND ANAEROBIC 5CC EACH   Final   Culture  Setup Time     Final   Value: 01/12/2014 09:38     Performed at [facility]   Culture     Final   Value:        BLOOD CULTURE RECEIVED NO GROWTH TO DATE CULTURE WILL BE HELD FOR 5 DAYS BEFORE ISSUING A FINAL NEGATIVE REPORT     Performed at [facility]   Report Status PENDING   Incomplete  URINE CULTURE     Status: None   Collection Time    01/12/14 12:54 AM      Result Value Ref Range Status   Specimen Description URINE, CLEAN CATCH   Final   Special Requests NONE   Final   Culture  Setup Time     Final   Value: 01/12/2014 09:56     Performed at [facility]     Final   Value: NO GROWTH     Performed at [facility]   Culture     Final   Value: NO GROWTH     Performed at [facility]   Report Status  01/13/2014 FINAL   Final  SURGICAL PCR SCREEN     Status: Abnormal   Collection Time    01/13/14  8:03 PM      Result Value Ref Range Status   MRSA, PCR POSITIVE (*) NEGATIVE Final   Staphylococcus aureus POSITIVE (*) NEGATIVE Final   Comment:            The Xpert SA Assay (FDA     approved for NASAL specimens     in patients over 80 years of age),     is one component of     a comprehensive surveillance     program.  Test performance has     been validated by [employer] for patients greater     than or equal to 68 year old.     It is not intended     to diagnose infection nor to     guide or monitor treatment.         Studies: Ir Fluoro Guide Cv Line Right  01/14/2014   CLINICAL DATA:  Pseudomonas infection, needs venous access. Chronic kidney disease.  EXAM: RIGHT IJ CENTRAL VENOUS CATHETER PLACEMENT UNDER ULTRASOUND AND FLUOROSCOPIC GUIDANCE:  FLUOROSCOPY TIME:  6 seconds  TECHNIQUE: The procedure, risks (including but not limited to bleeding, infection, organ damage ), benefits, and alternatives were explained to the patient. Questions regarding the procedure were encouraged and answered. The patient understands and consents to the procedure. Patency of the right IJ vein was confirmed with ultrasound with image documentation. An appropriate skin site was determined. Skin site was marked. Region was prepped using maximum barrier technique including cap and mask, sterile gown, sterile gloves, large sterile sheet, and Chlorhexidine as cutaneous antisepsis. The region was infiltrated locally with 1% lidocaine.  Under real-time ultrasound guidance, the right IJ vein was accessed with a 21 gauge micropuncture needle; the needle tip within the vein was confirmed with ultrasound image documentation. The needle exchanged over a guidewire for peel-away  sheath through which an 24cm 5 [place] PowerPICC Single lumen catheter was  advanced. This was positioned with the tip at the cavoatrial  junction. Spot  chest radiograph shows good positioning and no pneumothorax. Catheter was flushed  and sutured externally with 0-Prolene sutures. Patient tolerated the procedure  well, with no immediate complication.  IMPRESSION: 1. Technically successful right IJ Single lumenpower injectable central venous catheter placement.   Electronically Signed   By: Oley Balm M.D.   On: 01/14/2014 14:59   Ir US Guide Vasc Access Right  01/14/2014   CLINICAL DATA:  Pseudomonas infection, needs venous access. Chronic kidney disease.  EXAM: RIGHT IJ CENTRAL VENOUS CATHETER PLACEMENT UNDER ULTRASOUND AND FLUOROSCOPIC GUIDANCE:  FLUOROSCOPY TIME:  6 seconds  TECHNIQUE: The procedure, risks (including but not limited to bleeding, infection, organ damage ), benefits, and alternatives were explained to the patient. Questions regarding the procedure were encouraged and answered. The patient understands and consents to the procedure. Patency of the right IJ vein was confirmed with ultrasound with image documentation. An appropriate skin site was determined. Skin site was marked. Region was prepped using maximum barrier technique including cap and mask, sterile gown, sterile gloves, large sterile sheet, and Chlorhexidine as cutaneous antisepsis. The region was infiltrated locally with 1% lidocaine. Under real-time ultrasound guidance, the right IJ vein was accessed with a 21 gauge micropuncture needle; the needle tip within the vein was confirmed with ultrasound image documentation. The needle exchanged over a guidewire for peel-away  sheath through which an 24cm 5 [place] PowerPICC Single lumen catheter was  advanced. This was positioned with the tip at the cavoatrial junction. Spot  chest radiograph shows good positioning and no pneumothorax. Catheter was flushed  and sutured externally with 0-Prolene sutures. Patient tolerated the procedure  well, with no immediate complication.  IMPRESSION: 1. Technically successful right IJ  Single lumenpower injectable central venous catheter placement.   Electronically Signed   By: Oley Balm M.D.   On: 01/14/2014 14:59        Scheduled Meds: . acetic acid   Irrigation Daily  . acidophilus  2 capsule Oral BID  . Chlorhexidine Gluconate Cloth  6 each Topical Q0600  . heparin  5,000 Units Subcutaneous 3 times per day  . insulin aspart  0-9 Units Subcutaneous TID WC  . insulin aspart protamine- aspart  80 Units Subcutaneous BID WC  . iron polysaccharides  150 mg Oral BID  . latanoprost  1 drop Both Eyes QHS  . mupirocin ointment  1 application Topical Daily  . mupirocin ointment  1 application Nasal BID  . piperacillin-tazobactam (ZOSYN)  IV  3.375 g Intravenous 3 times per day  . timolol  1 drop Both Eyes Daily  . vancomycin  2,000 mg Intravenous Q24H   Continuous Infusions:   Principal Problem:   Venous stasis ulcer of left lower extremity Active Problems:   Type II or unspecified type diabetes mellitus with unspecified complication, uncontrolled   Sepsis   Ulcer of left lower leg   CKD (chronic kidney disease), stage III   Hypokalemia   Anemia   Diabetic leg ulcer   Renal failure, acute on chronic    Time spent: 25 minutes.    Elease Etienne, MD, FACP, [facility]. [facility] Pager  [phone]  If 7PM-7AM, please contact night-coverage [email or web address] Password [email or web address] 01/15/2014, 11:08 AM    LOS: 4 days

## 2014-01-15 NOTE — Progress Notes (Addendum)
Addendum  EKG: Afib with CVR. ?PAF. 2 D Echo requested. Check TSH. Place on Telemetry. Cardiology consulted: ? Heart monitor & AC.  Modena Jansky, MD, FACP, [facility]. [facility] Pager [phone]  If 7PM-7AM, please contact night-coverage [email or web address] Password [facility] 01/15/2014, 3:32 PM

## 2014-01-15 NOTE — Progress Notes (Signed)
[facility]  Patient Status: New pt for [facility] this admission  [facility] is providing the following services: HHRN, [facility] for home IV ABX,  [facility] hospital coordinator will provide in hospital teaching for IV ABX to support independence at home.  [facility] team will follow and support transition home when deemed appropriate.   If patient discharges after hours, please call [phone].   Larry Sierras 01/15/2014, 4:15 PM

## 2014-01-15 NOTE — Consult Note (Addendum)
CARDIOLOGY CONSULT NOTE   Patient ID: [name] MRN: [id number] DOB/AGE: 01/07/1946 68 y.o.  Admit date: 01/11/2014  Primary Physician   Shirline Frees, MD Primary Cardiologist   New Reason for Consultation   Afib   HPI: [name] is a 68 y.o. female with a history of morbid obesity, DM 2, chronic kidney disease, chronic lower extremity venous stasis ulcers who was admitted to [facility] on 01/13/14 with cellulitis of LLE.    Her left leg wound had significantly improved until she got a Pseudomonas infection. She was started on oral doxycycline for 18 days and was doing well during followup on 4/29. She then developed chills and fevers and was admitted to hospital for further management. She has since been seen by plastics and is now POD1 from surgical debridement of her venous ulcer with a wound VAC in place.   During her stay she had an ECG which revealed atrial fibrillation with controlled ventricular response. She denies a history of atrial fibrillation. She has no other past cardiac history and denies having undergone a previous stress test or cardiac cath. She does note that from time to time she has a [employer] in her chest that does not last more than two seconds. She denies SOB, CP or lightheadedness. She reports that in retrospect it may have gotten worse while on an 18 day course of doxycyline for her pseudomonas infection. During that time she reports 30-40 seconds of "feeling funny" in her chest, but not palpitations. She again denies  SOB, CP or lightheadedness. She does get SOB with exertion and is very physically deconditioned. She does snore when she sleeps but denies OSA. However, she has never had a sleep study.     Past Medical History  Diagnosis Date  . Venous insufficiency   . Venous stasis ulcer   . Diabetes mellitus due to underlying condition with diabetic nephropathy   . Neuropathy   . DJD (degenerative joint disease)   . Anemia   . Nephropathy, diabetic       Past Surgical History  Procedure Laterality Date  . Tonsilectomy, adenoidectomy, bilateral myringotomy and tubes    . Mouth surgery      Allergies  Allergen Reactions  . Bactrim [Sulfamethoxazole-Tmp Ds] Shortness Of Breath    Depression based reactions, altered mentality  . Atorvastatin Other (See Comments)    aches  . Sulfamethoxazole-Trimethoprim Swelling  . Actos [Pioglitazone Hydrochloride] Other (See Comments)    unknown  . Celebrex [Celecoxib] Other (See Comments) and Rash    unknown  . Ibuprofen Other (See Comments) and Rash  . Latex Rash  . Neosporin [Neomycin-Bacitracin Zn-Polymyx] Rash  . Pioglitazone Rash    I have reviewed the patient's current medications . acetic acid   Irrigation Daily  . acidophilus  2 capsule Oral BID  . ceFEPime (MAXIPIME) IV  2 g Intravenous Q24H  . Chlorhexidine Gluconate Cloth  6 each Topical Q0600  . heparin  5,000 Units Subcutaneous 3 times per day  . insulin aspart  0-9 Units Subcutaneous TID WC  . insulin aspart protamine- aspart  80 Units Subcutaneous BID WC  . iron polysaccharides  150 mg Oral BID  . latanoprost  1 drop Both Eyes QHS  . mupirocin ointment  1 application Topical Daily  . mupirocin ointment  1 application Nasal BID  . timolol  1 drop Both Eyes Daily  . vancomycin  2,000 mg Intravenous Q24H     acetaminophen, HYDROmorphone (DILAUDID) injection,  hydrOXYzine  Prior to Admission medications   Medication Sig Start Date End Date Taking? Authorizing Provider  acetic acid 0.25 % irrigation Irrigate with as directed daily.   Yes Historical Provider, MD  doxycycline (VIBRAMYCIN) 50 MG capsule Take 2 capsules (100 mg total) by mouth 2 (two) times daily. 01/07/14  Yes Campbell Riches, MD  glipiZIDE (GLUCOTROL) 10 MG tablet Take 10 mg by mouth 2 (two) times daily before a meal.   Yes Historical Provider, MD  hydrochlorothiazide 25 MG tablet Take 25 mg by mouth daily.    Yes Historical Provider, MD  hydrOXYzine  (ATARAX/VISTARIL) 50 MG tablet Take 50 mg by mouth 3 (three) times daily as needed for itching.    Yes Historical Provider, MD  insulin NPH-insulin regular (NOVOLIN 70/30) (70-30) 100 UNIT/ML injection Inject 80 Units into the skin 2 (two) times daily with a meal.    Yes Historical Provider, MD  insulin regular (NOVOLIN R,HUMULIN R) 100 units/mL injection Inject 0-20 Units into the skin daily as needed for high blood sugar. Sliding scale   Yes Historical Provider, MD  iron polysaccharides (NIFEREX) 150 MG capsule Take 150 mg by mouth 2 (two) times daily.    Yes Historical Provider, MD  lactobacillus acidophilus (BACID) TABS Take 2 tablets by mouth daily after breakfast.     Yes Historical Provider, MD  latanoprost (XALATAN) 0.005 % ophthalmic solution Place 1 drop into both eyes at bedtime. 12/30/13  Yes Historical Provider, MD  liver oil-zinc oxide (DESITIN) 40 % ointment Apply 1 application topically daily.   Yes Historical Provider, MD  mupirocin ointment (BACTROBAN) 2 % Apply 1 application topically daily. 11/14/13  Yes Historical Provider, MD  potassium chloride (K-DUR,KLOR-CON) 10 MEQ tablet Take 10 mEq by mouth 2 (two) times daily.   Yes Historical Provider, MD  timolol (TIMOPTIC) 0.5 % ophthalmic solution Place 1 drop into both eyes daily. 10/16/13  Yes Historical Provider, MD  traMADol (ULTRAM) 50 MG tablet Take 50 mg by mouth every 6 (six) hours as needed for moderate pain.    Yes Historical Provider, MD  vitamin C (ASCORBIC ACID) 500 MG tablet Take 500 mg by mouth 2 (two) times daily.    Yes Historical Provider, MD  bumetanide (BUMEX) 2 MG tablet Take 2 mg by mouth 2 (two) times daily.      Historical Provider, MD  COD LIVER OIL PO Take 1,000 mg by mouth daily.      Historical Provider, MD     History   Social History  . Marital Status: Married    Spouse Name: N/A    Number of Children: N/A  . Years of Education: N/A   Occupational History  . Not on file.   Social History Main Topics   . Smoking status: Never Smoker   . Smokeless tobacco: Never Used  . Alcohol Use: No  . Drug Use: No  . Sexual Activity: Not on file   Other Topics Concern  . Not on file   Social History Narrative  . No narrative on file    No family status information on file.   Family History  Problem Relation Age of Onset  . Heart disease Mother   . Heart disease Father   . Diabetes Maternal Grandmother      ROS:  Full 14 point review of systems complete and found to be negative unless listed above.  Physical Exam: Blood pressure 136/58, pulse 62, temperature 97.8 F (36.6 C), temperature source Oral, resp. rate  17, height 5\' 7"  (1.702 m), weight 314 lb 5.1 oz (142.575 kg), SpO2 100.00%.  General: Well developed, well nourished, female in no acute distress Head: Eyes PERRLA, No xanthomas.   Normocephalic and atraumatic, oropharynx without edema or exudate. Dentition:  Lungs:  Heart: HRRR S1 S2, no rub/gallop, Heart irregular rate and rhythm with S1, S2  murmur. pulses are 2+ extrem.   Neck: No carotid bruits. No lymphadenopathy.  JVD. Abdomen: Bowel sounds present, abdomen soft and non-tender without masses or hernias noted. Msk:  No spine or cva tenderness. No weakness, no joint deformities or effusions. Extremities: No clubbing or cyanosis.  edema.  Neuro: Alert and oriented X 3. No focal deficits noted. Psych:  Good affect, responds appropriately Skin: No rashes or lesions noted.  Labs:   Lab Results  Component Value Date   WBC 4.6 01/15/2014   HGB 7.3* 01/15/2014   HCT 22.5* 01/15/2014   MCV 90.4 01/15/2014   PLT 193 01/15/2014    Recent Labs  01/14/14 0405  INR 1.27    Recent Labs Lab 01/15/14 0455  NA 136*  K 4.0  CL 101  CO2 19  BUN 32*  CREATININE 1.93*  CALCIUM 8.6  GLUCOSE 164*   Magnesium  Date Value Ref Range Status  01/14/2014 2.5  1.5 - 2.5 mg/dL Final   Ferritin  Date/Time Value Ref Range Status  01/14/2014  3:08 PM 112  10 - 291 ng/mL Final     Performed  at [facility]     Echo: Pending  ECG:  Atrial fibrillation with controlled VR  Radiology:    ASSESSMENT AND PLAN:    Principal Problem:   Venous stasis ulcer of left lower extremity Active Problems:   Type II or unspecified type diabetes mellitus with unspecified complication, uncontrolled   Sepsis   Ulcer of left lower leg   CKD (chronic kidney disease), stage III   Hypokalemia   Anemia   Diabetic leg ulcer   Renal failure, acute on chronic  Avalene Sealy is a 68 y.o. female with a history of morbid obesity, DM 2, chronic kidney disease, chronic lower extremity venous stasis ulcers who was admitted to [facility] on 01/13/14 with cellulitis of LLE.    PAF: Patient denies prior history of A. fib diagnosis, HTN or strokes. She does give history of transient fluttering of heart which lasted no more than a couple seconds.  -- CHA2DS2-VASc score: 3 ( DM, AGE, Females sex). She is high risk for a stroke and will require long term anticoagulation.  -- Currently in NSR -- TSH pending -- ECHO pending. If a normal EF we will proceed with a further work up  -- Would not be a candidate for NOAC due to CKD. Anticoagulation would be difficult in the setting of chronic anemia with Hg of 7.3 as she has no reserves if she were to bleed.  She is also non compliant and has issues with medication coverage.  Sepsis secondary to Left leg infected (pseudomonas) chronic venous stasis ulcer/cellulitis: Infectious disease and plastic surgery was consulted. Patient was empirically started on IV Zosyn and vancomycin. She underwent irrigation and debridement of left leg ulcer and placement of wound VAC in the OR on 5/6. She will need prolonged IV antibiotics.   DM 2: per IM   CKD: Baseline creatinine in the range of 1.7. Currently 1.93. Continue to follow  Anemia: Likely secondary to chronic kidney disease and iron deficiency . 7.3, this is essentially her baseline.  Hypokalemia: Resolved.   Chronic  venous stasis: Usually takes HCTZ and bumetanide at home. Her hyponatremia and hypokalemia probably related to HCTZ. Diuretics currently on hold. Consider resuming diuretics at discharge.     Signed: Perry Mount, PA-C 01/15/2014 3:52 PM  Pager [phone]  Co-Sign MD  The patient was seen, examined and discussed with Lorretta Harp, PA-C and I agree with the above.   Mrs Eigsti is a 68 year old female with h/o morbid obesity, DM, CKD stage IV , chronic lower extremity venous stasis ulcers who was admitted to [facility] on 01/13/14 with cellulitis of LLE. The patient has chronic Pseudomonal LLE cellulitis since 2011, currently on wound vac. The patient was incidentally found to be in atrial fibrillation on admission, currently in SR with 1.AVB. Upon questioning she admits that every once a while she feels some palpitations lasting 30-40 seconds but those are never associated with chest pain, SOB, presyncope or syncope. She has never had cardiac/ischemic evaluation. Echocardiogram and TSH is pending. Her CHADS-VASc is 3. If her LVEF is normal I would advise against anticoagulation as currently her risks outweigh her benefits. Her risks include severe anemia (Hb 7.3 g/dL) and primary team is considering PRBC transfusion, also she has ongoing bleeding from her wound, and she is a risk for fall. We will follow the echo results and decide. We would recommend an aspirin 325 mg po daily for now.   Dorothy Spark 01/15/2014

## 2014-01-15 NOTE — Progress Notes (Signed)
1 Day Post-Op Debridement and surgical prep of chronic venous stasis ulcer of left lower leg Subjective: Patient reports her pain is improving following the debridement.  She is reporting frequent stool as well, with diarrhea and has history of C diff per her report, so have ordered C diff PCR. She is on probiotics.  Continues Zosyn and VANC for infection of left lower leg.   The VAC dressing remains intact over the left lower leg and should remain in place until her follow up next week in the wound care clinic.   Objective: Vital signs in last 24 hours: Temp:  [97.4 F (36.3 C)-98.4 F (36.9 C)] 97.4 F (36.3 C) (05/07 0543) Pulse Rate:  [59-66] 66 (05/07 0543) Resp:  [15-18] 16 (05/07 0543) BP: (121-143)/(52-96) 138/96 mmHg (05/07 0543) SpO2:  [93 %-100 %] 93 % (05/07 0543) Last BM Date: 01/14/14  Intake/Output from previous day: 05/06 0701 - 05/07 0700 In: 1733.8 [I.V.:1233.8; IV Piggyback:500] Out: 1220 [Urine:1200; Blood:20] Intake/Output this shift: Total I/O In: 300 [P.O.:300] Out: 750 [Urine:750]  General appearance: alert, cooperative and morbidly obese The left lower extremity dressings are intact and the VAC canister has a minimal amount of serosanguinous drainage  Lab Results:   Recent Labs  01/14/14 0405 01/15/14 0455  WBC 6.8 4.6  HGB 7.6* 7.3*  HCT 22.3* 22.5*  PLT 182 193   BMET  Recent Labs  01/14/14 0405 01/15/14 0455  NA 135* 136*  K 3.1* 4.0  CL 98 101  CO2 21 19  GLUCOSE 94 164*  BUN 39* 32*  CREATININE 2.18* 1.93*  CALCIUM 8.6 8.6   PT/INR  Recent Labs  01/14/14 0405  LABPROT 15.6*  INR 1.27   ABG No results found for this basename: PHART, PCO2, PO2, HCO3,  in the last 72 hours  Studies/Results: Ir Fluoro Guide Cv Line Right  01/14/2014   CLINICAL DATA:  Pseudomonas infection, needs venous access. Chronic kidney disease.  EXAM: RIGHT IJ CENTRAL VENOUS CATHETER PLACEMENT UNDER ULTRASOUND AND FLUOROSCOPIC GUIDANCE:  FLUOROSCOPY  TIME:  6 seconds  TECHNIQUE: The procedure, risks (including but not limited to bleeding, infection, organ damage ), benefits, and alternatives were explained to the patient. Questions regarding the procedure were encouraged and answered. The patient understands and consents to the procedure. Patency of the right IJ vein was confirmed with ultrasound with image documentation. An appropriate skin site was determined. Skin site was marked. Region was prepped using maximum barrier technique including cap and mask, sterile gown, sterile gloves, large sterile sheet, and Chlorhexidine as cutaneous antisepsis. The region was infiltrated locally with 1% lidocaine. Under real-time ultrasound guidance, the right IJ vein was accessed with a 21 gauge micropuncture needle; the needle tip within the vein was confirmed with ultrasound image documentation. The needle exchanged over a guidewire for peel-away  sheath through which an 24cm 5 [place] PowerPICC Single lumen catheter was  advanced. This was positioned with the tip at the cavoatrial junction. Spot  chest radiograph shows good positioning and no pneumothorax. Catheter was flushed  and sutured externally with 0-Prolene sutures. Patient tolerated the procedure  well, with no immediate complication.  IMPRESSION: 1. Technically successful right IJ Single lumenpower injectable central venous catheter placement.   Electronically Signed   By: Arne Cleveland M.D.   On: 01/14/2014 14:59   Ir US Guide Vasc Access Right  01/14/2014   CLINICAL DATA:  Pseudomonas infection, needs venous access. Chronic kidney disease.  EXAM: RIGHT IJ CENTRAL VENOUS CATHETER PLACEMENT UNDER  ULTRASOUND AND FLUOROSCOPIC GUIDANCE:  FLUOROSCOPY TIME:  6 seconds  TECHNIQUE: The procedure, risks (including but not limited to bleeding, infection, organ damage ), benefits, and alternatives were explained to the patient. Questions regarding the procedure were encouraged and answered. The patient understands  and consents to the procedure. Patency of the right IJ vein was confirmed with ultrasound with image documentation. An appropriate skin site was determined. Skin site was marked. Region was prepped using maximum barrier technique including cap and mask, sterile gown, sterile gloves, large sterile sheet, and Chlorhexidine as cutaneous antisepsis. The region was infiltrated locally with 1% lidocaine. Under real-time ultrasound guidance, the right IJ vein was accessed with a 21 gauge micropuncture needle; the needle tip within the vein was confirmed with ultrasound image documentation. The needle exchanged over a guidewire for peel-away  sheath through which an 24cm 5 [place] PowerPICC Single lumen catheter was  advanced. This was positioned with the tip at the cavoatrial junction. Spot  chest radiograph shows good positioning and no pneumothorax. Catheter was flushed  and sutured externally with 0-Prolene sutures. Patient tolerated the procedure  well, with no immediate complication.  IMPRESSION: 1. Technically successful right IJ Single lumenpower injectable central venous catheter placement.   Electronically Signed   By: Arne Cleveland M.D.   On: 01/14/2014 14:59    Anti-infectives: Anti-infectives   Start     Dose/Rate Route Frequency Ordered Stop   01/14/14 0925  polymyxin B 500,000 Units, bacitracin 50,000 Units in sodium chloride irrigation 0.9 % 500 mL irrigation  Status:  Discontinued       As needed 01/14/14 0926 01/14/14 0940   01/12/14 1600  vancomycin (VANCOCIN) 2,000 mg in sodium chloride 0.9 % 500 mL IVPB     2,000 mg 250 mL/hr over 120 Minutes Intravenous Every 24 hours 01/12/14 1359     01/12/14 0600  piperacillin-tazobactam (ZOSYN) IVPB 3.375 g     3.375 g 12.5 mL/hr over 240 Minutes Intravenous 3 times per day 01/11/14 2344     01/11/14 2230  vancomycin (VANCOCIN) 2,000 mg in sodium chloride 0.9 % 500 mL IVPB  Status:  Discontinued     2,000 mg 250 mL/hr over 120 Minutes Intravenous   Once 01/11/14 2224 01/11/14 2247   01/11/14 2230  piperacillin-tazobactam (ZOSYN) IVPB 3.375 g     3.375 g 100 mL/hr over 30 Minutes Intravenous  Once 01/11/14 2224 01/11/14 2340      Assessment/Plan: s/p Procedure(s): IRRIGATION AND DEBRIDEMENT LEFT LOWER EXTREMITY POSSIBLE ACELL AND POSSIBLE VAC PLACEMENT (Left) Status post debridement , surgical prep and placement of Acell and VAC dressing to left lower leg ulceration, chronic- POD #1 Continue VAC dressing until follow up next week in the [facility] I have scheduled follow up at the [facility] next Monday, May 11th, at 12:45 pm with Dr. Iran Planas MDR Pseudomonas infection- management per ID, checking C diff PCR as noted. Multiple chronic medical issues- per Hospitalist.  Okay for discharge from our standpoint when cleared by attending MD  LOS: 4 days    [facility] Plastic Surgery [phone]

## 2014-01-15 NOTE — Progress Notes (Signed)
ANTIBIOTIC CONSULT NOTE - Follow up  Pharmacy Consult for vancomycin Indication: cellulitis  Allergies  Allergen Reactions  . Bactrim [Sulfamethoxazole-Tmp Ds] Shortness Of Breath    Depression based reactions, altered mentality  . Atorvastatin Other (See Comments)    aches  . Sulfamethoxazole-Trimethoprim Swelling  . Actos [Pioglitazone Hydrochloride] Other (See Comments)    unknown  . Celebrex [Celecoxib] Other (See Comments) and Rash    unknown  . Ibuprofen Other (See Comments) and Rash  . Latex Rash  . Neosporin [Neomycin-Bacitracin Zn-Polymyx] Rash  . Pioglitazone Rash    Patient Measurements: Height: 5\' 7"  (170.2 cm) Weight: 314 lb 5.1 oz (142.575 kg) IBW/kg (Calculated) : 61.6   Vital Signs: Temp: 97.8 F (36.6 C) (05/07 1211) Temp src: Oral (05/07 1211) BP: 136/58 mmHg (05/07 1211) Pulse Rate: 62 (05/07 1211) Intake/Output from previous day: 05/06 0701 - 05/07 0700 In: 1733.8 [I.V.:1233.8; IV Piggyback:500] Out: 6269 [Urine:1200; Blood:20] Intake/Output from this shift: Total I/O In: 300 [P.O.:300] Out: 750 [Urine:750]  Labs:  Recent Labs  01/13/14 0423 01/14/14 0405 01/15/14 0455  WBC 7.8 6.8 4.6  HGB 7.5* 7.6* 7.3*  PLT 183 182 193  CREATININE 1.98* 2.18* 1.93*   Estimated Creatinine Clearance: 40.8 ml/min (by C-G formula based on Cr of 1.93).  Recent Labs  01/15/14 1518  [place] 28.2*     Microbiology: Recent Results (from the past 720 hour(s))  CULTURE, BLOOD (ROUTINE X 2)     Status: None   Collection Time    01/11/14 10:55 PM      Result Value Ref Range Status   Specimen Description BLOOD RIGHT ARM   Final   Special Requests BOTTLES DRAWN AEROBIC AND ANAEROBIC 10CC EACH   Final   Culture  Setup Time     Final   Value: 01/12/2014 09:38     Performed at [facility]   Culture     Final   Value:        BLOOD CULTURE RECEIVED NO GROWTH TO DATE CULTURE WILL BE HELD FOR 5 DAYS BEFORE ISSUING A FINAL NEGATIVE REPORT   Performed at [facility]   Report Status PENDING   Incomplete  CULTURE, BLOOD (ROUTINE X 2)     Status: None   Collection Time    01/11/14 11:08 PM      Result Value Ref Range Status   Specimen Description BLOOD LEFT HAND   Final   Special Requests BOTTLES DRAWN AEROBIC AND ANAEROBIC 5CC EACH   Final   Culture  Setup Time     Final   Value: 01/12/2014 09:38     Performed at [facility]   Culture     Final   Value:        BLOOD CULTURE RECEIVED NO GROWTH TO DATE CULTURE WILL BE HELD FOR 5 DAYS BEFORE ISSUING A FINAL NEGATIVE REPORT     Performed at [facility]   Report Status PENDING   Incomplete  URINE CULTURE     Status: None   Collection Time    01/12/14 12:54 AM      Result Value Ref Range Status   Specimen Description URINE, CLEAN CATCH   Final   Special Requests NONE   Final   Culture  Setup Time     Final   Value: 01/12/2014 09:56     Performed at [facility]     Final   Value: NO GROWTH  Performed at [facility]     Final   Value: NO GROWTH     Performed at [facility]   Report Status 01/13/2014 FINAL   Final  SURGICAL PCR SCREEN     Status: Abnormal   Collection Time    01/13/14  8:03 PM      Result Value Ref Range Status   MRSA, PCR POSITIVE (*) NEGATIVE Final   Staphylococcus aureus POSITIVE (*) NEGATIVE Final   Comment:            The Xpert SA Assay (FDA     approved for NASAL specimens     in patients over 35 years of age),     is one component of     a comprehensive surveillance     program.  Test performance has     been validated by [employer] for patients greater     than or equal to 70 year old.     It is not intended     to diagnose infection nor to     guide or monitor treatment.    Medical History: Past Medical History  Diagnosis Date  . Venous insufficiency   . Venous stasis ulcer   . Diabetes mellitus due to underlying condition with diabetic  nephropathy   . Neuropathy   . DJD (degenerative joint disease)   . Anemia   . Nephropathy, diabetic     Medications:  Prescriptions prior to admission  Medication Sig Dispense Refill  . acetic acid 0.25 % irrigation Irrigate with as directed daily.      Marland Kitchen doxycycline (VIBRAMYCIN) 50 MG capsule Take 2 capsules (100 mg total) by mouth 2 (two) times daily.  56 capsule  3  . glipiZIDE (GLUCOTROL) 10 MG tablet Take 10 mg by mouth 2 (two) times daily before a meal.      . hydrochlorothiazide 25 MG tablet Take 25 mg by mouth daily.       . hydrOXYzine (ATARAX/VISTARIL) 50 MG tablet Take 50 mg by mouth 3 (three) times daily as needed for itching.       . insulin NPH-insulin regular (NOVOLIN 70/30) (70-30) 100 UNIT/ML injection Inject 80 Units into the skin 2 (two) times daily with a meal.       . insulin regular (NOVOLIN R,HUMULIN R) 100 units/mL injection Inject 0-20 Units into the skin daily as needed for high blood sugar. Sliding scale      . iron polysaccharides (NIFEREX) 150 MG capsule Take 150 mg by mouth 2 (two) times daily.       Marland Kitchen lactobacillus acidophilus (BACID) TABS Take 2 tablets by mouth daily after breakfast.        . latanoprost (XALATAN) 0.005 % ophthalmic solution Place 1 drop into both eyes at bedtime.      Marland Kitchen liver oil-zinc oxide (DESITIN) 40 % ointment Apply 1 application topically daily.      . mupirocin ointment (BACTROBAN) 2 % Apply 1 application topically daily.      . potassium chloride (K-DUR,KLOR-CON) 10 MEQ tablet Take 10 mEq by mouth 2 (two) times daily.      . timolol (TIMOPTIC) 0.5 % ophthalmic solution Place 1 drop into both eyes daily.      . traMADol (ULTRAM) 50 MG tablet Take 50 mg by mouth every 6 (six) hours as needed for moderate pain.       . vitamin C (ASCORBIC ACID) 500 MG tablet  Take 500 mg by mouth 2 (two) times daily.       . bumetanide (BUMEX) 2 MG tablet Take 2 mg by mouth 2 (two) times daily.        . COD LIVER OIL PO Take 1,000 mg by mouth daily.          Assessment: 68 yo obese F on vancomycin per pharmacy consult for cellulitis.   She has a venous stasis ulcer of LLE and uncontrolled DM, CRI.  She has had LLE ulcer since 2011. She had Cx done during debridement 11-30-13 showing 2+ P aeruginosa (R- aztreonam and cipro) and 1+ CNS. She had been on no anbx until 12-23-13 when she was seen in ID. She was started on doxy (trying to avoid IV).She was seen in ID f/u on 4-29 and appeared to be doing well.  By 5-2 she had developed chills. She stopped her anbx and by 5-3 she had fever and came to hospital. She was started on zosyn 5/3 and vanc added 5/4.  01/15/14 Today: Wt 142.6 kg. WBC 4.6 SCr 1.93; Afebrile, Tmax 98.4.  Plan to place PICC for home abx once improving.  Zosyn discontinued and changed to Cefepime. Vancomycin continued.  Vancomycin trough = 28.2 mcg/ml today 01/15/14 @15 :18 on vancomycin 2000 mg IV q24h  Day# 4 (goal = 10-15 mcg/ml)  Zosyn 5/3>>5/7 Cefepime 5/7>> vanco 5/4>>  5/4 Ucx >> negative/final 5/3 BC x 2 >> NGTD   Goal of Therapy:  Vancomycin trough level 10-15 mcg/ml  Plan:  1. Decrease Vancomycin dose to 1250 mg IV q24h 2. F/u renal fxn, wbc, temp, culture data, clinical course 3. Steady-state vancomycin trough as needed. Need to check at least weekly if discharged on vancomycin.  Nicole Cella, RPh Clinical Pharmacist Pager: [phone] 01/15/2014 4:34 PM

## 2014-01-16 ENCOUNTER — Encounter (HOSPITAL_COMMUNITY): Payer: Self-pay | Admitting: Plastic Surgery

## 2014-01-16 DIAGNOSIS — I369 Nonrheumatic tricuspid valve disorder, unspecified: Secondary | ICD-10-CM

## 2014-01-16 DIAGNOSIS — E1169 Type 2 diabetes mellitus with other specified complication: Secondary | ICD-10-CM

## 2014-01-16 LAB — BASIC METABOLIC PANEL
BUN: 28 mg/dL — AB (ref 6–23)
CO2: 20 mEq/L (ref 19–32)
CREATININE: 1.86 mg/dL — AB (ref 0.50–1.10)
Calcium: 8.8 mg/dL (ref 8.4–10.5)
Chloride: 103 mEq/L (ref 96–112)
GFR calc Af Amer: 31 mL/min — ABNORMAL LOW (ref >90)
GFR, EST NON AFRICAN AMERICAN: 27 mL/min — AB (ref >90)
Glucose, Bld: 106 mg/dL — ABNORMAL HIGH (ref 70–99)
POTASSIUM: 3.8 meq/L (ref 3.7–5.3)
Sodium: 138 mEq/L (ref 137–147)

## 2014-01-16 LAB — CBC
HEMATOCRIT: 23.4 % — AB (ref 36.0–46.0)
HEMOGLOBIN: 7.5 g/dL — AB (ref 12.0–15.0)
MCH: 29 pg (ref 26.0–34.0)
MCHC: 32.1 g/dL (ref 30.0–36.0)
MCV: 90.3 fL (ref 78.0–100.0)
Platelets: 194 10*3/uL (ref 150–400)
RBC: 2.59 MIL/uL — ABNORMAL LOW (ref 3.87–5.11)
RDW: 17.4 % — ABNORMAL HIGH (ref 11.5–15.5)
WBC: 5.5 10*3/uL (ref 4.0–10.5)

## 2014-01-16 LAB — GLUCOSE, CAPILLARY
GLUCOSE-CAPILLARY: 106 mg/dL — AB (ref 70–99)
GLUCOSE-CAPILLARY: 49 mg/dL — AB (ref 70–99)
Glucose-Capillary: 107 mg/dL — ABNORMAL HIGH (ref 70–99)
Glucose-Capillary: 82 mg/dL (ref 70–99)
Glucose-Capillary: 42 mg/dL — CL (ref 70–99)
Glucose-Capillary: 92 mg/dL (ref 70–99)

## 2014-01-16 LAB — CLOSTRIDIUM DIFFICILE BY PCR: Toxigenic C. Difficile by PCR: NEGATIVE

## 2014-01-16 MED ORDER — DEXTROSE 50 % IV SOLN
INTRAVENOUS | Status: AC
  Filled 2014-01-16: qty 50

## 2014-01-16 MED ORDER — FERROUS SULFATE 325 (65 FE) MG PO TABS
325.0000 mg | ORAL_TABLET | Freq: Three times a day (TID) | ORAL | Status: DC
  Administered 2014-01-16 – 2014-01-17 (×3): 325 mg via ORAL
  Filled 2014-01-16 (×4): qty 1

## 2014-01-16 MED ORDER — GLUCOSE 40 % PO GEL
ORAL | Status: AC
  Administered 2014-01-16: 21:00:00
  Filled 2014-01-16: qty 1

## 2014-01-16 MED ORDER — DEXTROSE 50 % IV SOLN
25.0000 mL | Freq: Once | INTRAVENOUS | Status: AC
  Administered 2014-01-16: 25 mL via INTRAVENOUS
  Filled 2014-01-16: qty 50

## 2014-01-16 MED ORDER — PANTOPRAZOLE SODIUM 40 MG PO TBEC
40.0000 mg | DELAYED_RELEASE_TABLET | Freq: Two times a day (BID) | ORAL | Status: DC
  Administered 2014-01-16 – 2014-01-17 (×3): 40 mg via ORAL
  Filled 2014-01-16 (×2): qty 1

## 2014-01-16 NOTE — Progress Notes (Signed)
2 Days Post-Op  Subjective: Post operative day 2 after I and D of left leg with Acell and VAC placement.  The VAC is holding its seal.  She was uncomfortable with the wrap so made it loss.  Objective: Vital signs in last 24 hours: Temp:  [97.2 F (36.2 C)-97.9 F (36.6 C)] 97.9 F (36.6 C) (05/08 0618) Pulse Rate:  [62-65] 62 (05/08 0618) Resp:  [17-18] 17 (05/08 0618) BP: (122-140)/(46-66) 140/66 mmHg (05/08 0618) SpO2:  [97 %-100 %] 99 % (05/08 0618) Last BM Date: 01/15/14  Intake/Output from previous day: 05/07 0701 - 05/08 0700 In: 1680 [P.O.:1380; IV Piggyback:300] Out: 1050 [Urine:1050] Intake/Output this shift:    General appearance: alert and cooperative  Lab Results:   Recent Labs  01/15/14 0455 01/16/14 0513  WBC 4.6 5.5  HGB 7.3* 7.5*  HCT 22.5* 23.4*  PLT 193 194   BMET  Recent Labs  01/15/14 0455 01/16/14 0513  NA 136* 138  K 4.0 3.8  CL 101 103  CO2 19 20  GLUCOSE 164* 106*  BUN 32* 28*  CREATININE 1.93* 1.86*  CALCIUM 8.6 8.8   PT/INR  Recent Labs  01/14/14 0405  LABPROT 15.6*  INR 1.27   ABG No results found for this basename: PHART, PCO2, PO2, HCO3,  in the last 72 hours  Studies/Results: Ir Fluoro Guide Cv Line Right  01/14/2014   CLINICAL DATA:  Pseudomonas infection, needs venous access. Chronic kidney disease.  EXAM: RIGHT IJ CENTRAL VENOUS CATHETER PLACEMENT UNDER ULTRASOUND AND FLUOROSCOPIC GUIDANCE:  FLUOROSCOPY TIME:  6 seconds  TECHNIQUE: The procedure, risks (including but not limited to bleeding, infection, organ damage ), benefits, and alternatives were explained to the patient. Questions regarding the procedure were encouraged and answered. The patient understands and consents to the procedure. Patency of the right IJ vein was confirmed with ultrasound with image documentation. An appropriate skin site was determined. Skin site was marked. Region was prepped using maximum barrier technique including cap and mask, sterile  gown, sterile gloves, large sterile sheet, and Chlorhexidine as cutaneous antisepsis. The region was infiltrated locally with 1% lidocaine. Under real-time ultrasound guidance, the right IJ vein was accessed with a 21 gauge micropuncture needle; the needle tip within the vein was confirmed with ultrasound image documentation. The needle exchanged over a guidewire for peel-away  sheath through which an 24cm 5 [place] PowerPICC Single lumen catheter was  advanced. This was positioned with the tip at the cavoatrial junction. Spot  chest radiograph shows good positioning and no pneumothorax. Catheter was flushed  and sutured externally with 0-Prolene sutures. Patient tolerated the procedure  well, with no immediate complication.  IMPRESSION: 1. Technically successful right IJ Single lumenpower injectable central venous catheter placement.   Electronically Signed   By: Arne Cleveland M.D.   On: 01/14/2014 14:59   Ir US Guide Vasc Access Right  01/14/2014   CLINICAL DATA:  Pseudomonas infection, needs venous access. Chronic kidney disease.  EXAM: RIGHT IJ CENTRAL VENOUS CATHETER PLACEMENT UNDER ULTRASOUND AND FLUOROSCOPIC GUIDANCE:  FLUOROSCOPY TIME:  6 seconds  TECHNIQUE: The procedure, risks (including but not limited to bleeding, infection, organ damage ), benefits, and alternatives were explained to the patient. Questions regarding the procedure were encouraged and answered. The patient understands and consents to the procedure. Patency of the right IJ vein was confirmed with ultrasound with image documentation. An appropriate skin site was determined. Skin site was marked. Region was prepped using maximum barrier technique including cap and mask,  sterile gown, sterile gloves, large sterile sheet, and Chlorhexidine as cutaneous antisepsis. The region was infiltrated locally with 1% lidocaine. Under real-time ultrasound guidance, the right IJ vein was accessed with a 21 gauge micropuncture needle; the needle tip  within the vein was confirmed with ultrasound image documentation. The needle exchanged over a guidewire for peel-away  sheath through which an 24cm 5 [place] PowerPICC Single lumen catheter was  advanced. This was positioned with the tip at the cavoatrial junction. Spot  chest radiograph shows good positioning and no pneumothorax. Catheter was flushed  and sutured externally with 0-Prolene sutures. Patient tolerated the procedure  well, with no immediate complication.  IMPRESSION: 1. Technically successful right IJ Single lumenpower injectable central venous catheter placement.   Electronically Signed   By: Arne Cleveland M.D.   On: 01/14/2014 14:59    Anti-infectives: Anti-infectives   Start     Dose/Rate Route Frequency Ordered Stop   01/15/14 1730  vancomycin (VANCOCIN) 1,250 mg in sodium chloride 0.9 % 250 mL IVPB     1,250 mg 166.7 mL/hr over 90 Minutes Intravenous Every 24 hours 01/15/14 1632     01/15/14 1200  ceFEPIme (MAXIPIME) 2 g in dextrose 5 % 50 mL IVPB     2 g 100 mL/hr over 30 Minutes Intravenous Every 24 hours 01/15/14 1151     01/14/14 0925  polymyxin B 500,000 Units, bacitracin 50,000 Units in sodium chloride irrigation 0.9 % 500 mL irrigation  Status:  Discontinued       As needed 01/14/14 0926 01/14/14 0940   01/12/14 1600  vancomycin (VANCOCIN) 2,000 mg in sodium chloride 0.9 % 500 mL IVPB  Status:  Discontinued     2,000 mg 250 mL/hr over 120 Minutes Intravenous Every 24 hours 01/12/14 1359 01/15/14 1631   01/12/14 0600  piperacillin-tazobactam (ZOSYN) IVPB 3.375 g  Status:  Discontinued     3.375 g 12.5 mL/hr over 240 Minutes Intravenous 3 times per day 01/11/14 2344 01/15/14 1151   01/11/14 2230  vancomycin (VANCOCIN) 2,000 mg in sodium chloride 0.9 % 500 mL IVPB  Status:  Discontinued     2,000 mg 250 mL/hr over 120 Minutes Intravenous  Once 01/11/14 2224 01/11/14 2247   01/11/14 2230  piperacillin-tazobactam (ZOSYN) IVPB 3.375 g     3.375 g 100 mL/hr over 30  Minutes Intravenous  Once 01/11/14 2224 01/11/14 2340      Assessment/Plan: s/p Procedure(s): IRRIGATION AND DEBRIDEMENT LEFT LOWER EXTREMITY POSSIBLE ACELL AND POSSIBLE VAC PLACEMENT (Left) From the leg stand point, she can go home when she has the home VAC.  Follow up in the wound care center in one week.  LOS: 5 days    [employer] 01/16/2014

## 2014-01-16 NOTE — Progress Notes (Signed)
  Echocardiogram 2D Echocardiogram has been performed.  [name] 01/16/2014, 10:11 AM

## 2014-01-16 NOTE — Progress Notes (Signed)
Hypoglycemic Event  CBG: 42  Treatment: 1 tube instant glucose and 15 GM carbohydrate snack  Symptoms: Sweaty and Shaky  Follow-up CBG: Time:2055 CBG Result:79  Possible Reasons for Event: Medication regimen: 70/30 80 units given and Change in activity  Comments/MD notified: Kathline Magic PA aware. Orders given    Rudi Coco  Remember to initiate Hypoglycemia Order Set & complete

## 2014-01-16 NOTE — Progress Notes (Signed)
    Subjective:  No complaints   Objective:  Vital Signs in the last 24 hours: Temp:  [97.2 F (36.2 C)-97.9 F (36.6 C)] 97.9 F (36.6 C) (05/08 0618) Pulse Rate:  [62-65] 62 (05/08 0618) Resp:  [17-18] 17 (05/08 0618) BP: (122-140)/(46-66) 140/66 mmHg (05/08 0618) SpO2:  [97 %-100 %] 99 % (05/08 0618)  Intake/Output from previous day:  Intake/Output Summary (Last 24 hours) at 01/16/14 1120 Last data filed at 01/16/14 0621  Gross per 24 hour  Intake   1020 ml  Output    300 ml  Net    720 ml    Physical Exam: General appearance: alert, cooperative, no distress and morbidly obese Lungs: clear to auscultation bilaterally Heart: regular rate and rhythm   Rate: 64  Rhythm: normal sinus rhythm and PACs  Lab Results:  Recent Labs  01/15/14 0455 01/16/14 0513  WBC 4.6 5.5  HGB 7.3* 7.5*  PLT 193 194    Recent Labs  01/15/14 0455 01/16/14 0513  NA 136* 138  K 4.0 3.8  CL 101 103  CO2 19 20  GLUCOSE 164* 106*  BUN 32* 28*  CREATININE 1.93* 1.86*   No results found for this basename: TROPONINI, CK, MB,  in the last 72 hours  Recent Labs  01/14/14 0405  INR 1.27    Imaging: Imaging results have been reviewed  Cardiac Studies:  Assessment/Plan:  Pt is a 68 y.o. female with a history of morbid obesity, DM 2, chronic kidney disease, chronic lower extremity venous stasis ulcers who was admitted to [facility] on 01/13/14 with cellulitis of LLE. EKG on admission revealed AF. This a new problem for her. She was basically asymptomatic and the rate was not fast. She has chronic renal insufficiency, anemia, and is at risk for falls so we have elected not to anticoagulate. Echo is pending.     Principal Problem:   Venous stasis ulcer of left lower extremity Active Problems:   PAF- new onset   Type II or unspecified type diabetes mellitus with unspecified complication, uncontrolled   CKD (chronic kidney disease), stage III   Anemia   Ulcer of left lower leg  Diabetic leg ulcer   Morbid obesity- BMI 49    PLAN: MD to review echo. She is currently on no rate control drugs and in NSR. Start ASA 325 mg if OK with primary service- ? Does she need GI evaluation, will order stool guaiac.   Kerin Ransom PA-C [facility] [phone] 01/16/2014, 11:20 AM   Attending Note:   The patient was seen and examined.  Agree with assessment and plan as noted above.  Changes made to the above note as needed.  1. Paroxysmal atrial fib:  She is still in NSR.  I agree with Dr. Meda Coffee that her risks of being anticiagulated outweight her benefits - In fact, I think we should hold her ASA.  She is still very anemic and likely has a slow ooz..  She will need a sleep study. She should see Dr. Meda Coffee in the office in 3-4 weeks.  2. Anemia:  She will need a GI evaluation and may also need a hematology eval ( if the GI eval is negative)  .  Currently, she is stable.  OK to go home when OK with Int. Med. Follow up with Dr. Meda Coffee.   Thayer Headings, Brooke Bonito., MD, [facility] 01/16/2014, 1:59 PM

## 2014-01-16 NOTE — Progress Notes (Signed)
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 Her left leg wound had significantly improved until she got Pseudomonas infection and it worsened. She was started on oral doxycycline for/14/15 and was doing well during followup on 4/29. She then developed chills, stopped her antibiotics and had fevers. Infectious disease and plastic surgery were consulted. Patient underwent I & D with wound VAC placement on 5/6. Tunneled central line placed for prolonged IV antibiotics    Assessment/Plan: Sepsis secondary to Left leg infected (pseudomonas) chronic venous stasis ulcer/cellulitis:  - Infectious disease and plastic surgery was consulted. Patient was empirically started on IV Zosyn and vancomycin. She underwent irrigation and debridement of left leg ulcer and placement of wound VAC in the OR on 5/6.  - She will need prolonged IV antibiotics.  - Status post tunneled central line placement 5/6 (no PICC secondary to chronic kidney disease).  - ID recommend IV cefepime. - home in am.  DM 2:  - Continue home 70/30 insulin.  - DC'ed oral medications due to risk for hypoglycemia. Added NovoLog SSI. Fluctuating but reasonable inpatient control.  Chronic kidney disease:  - Baseline creatinine in the range of 1.7. Patient was able to pull up her labs on my chart on 5/5 and in December 2014, creatinine was 1.77 and hemoglobin 11 g per DL suggesting current creatinine probably is close to her baseline. She did not followup with Dr. Moshe Cipro, nephrology due to financial issues. Creatinine slightly elevated-? Prerenal-improved after brief IV fluids. Patient seems volume overloaded. DC IV fluids and follow BMP in a.m. We'll need to followup with nephrology as outpatient.  Anemia:  Likely secondary to  chronic kidney disease and iron deficiency . Stable. Transfuse if hemoglobin less than 7 g per DL. Check ferritin:121 . Patient states that she had not taken her ferritin for 2 weeks while on oral doxycycline.   Hypokalemia:  - Replaced and follow BMP  Chronic venous stasis: -  Usually takes HCTZ and bumetanide at home.  - Consider resuming diuretics at discharge.   Diarrhea:  - Probably related to antibiotics. Continue probiotics.? Antimotility agents.  Paroxysmal A. Fib: -  Patient denies prior history of A. fib diagnosis, HTN or strokes. - consulted cards recommended no anticoagulation at this time. - follow up with them as an outpatient. - will need GI evaluation as an outpatinet    Code Status: Full  Family Communication: Discussed with spouse and daughter at bedside.  Disposition Plan: Remains inpatient    Consultants:  ID  Cardiology  plastic  Procedures: Echo 5.1.9622: Systolic function was normal. The estimated ejection fraction was in the range of 55% to 60%. Regional wall motion abnormalities cannot be excluded. Doppler parameters are consistent with restrictive physiology, indicative of decreased left ventricular diastolic compliance and/or increased left atrial pressure. Doppler parameters are consistent with high ventricular filling pressure   Antibiotics:  Cefepime   HPI/Subjective: Feel better  Objective: Filed Vitals:   01/15/14 0543 01/15/14 1211 01/15/14 2212 01/16/14 0618  BP: 138/96 136/58 122/46 140/66  Pulse: 66 62 65 62  Temp: 97.4 F (36.3 C) 97.8 F (36.6 C) 97.2 F (36.2 C) 97.9 F (36.6 C)  TempSrc:  Oral Oral Oral  Resp: 16 17 18 17   Height:      Weight:      SpO2: 93%  100% 97% 99%    Intake/Output Summary (Last 24 hours) at 01/16/14 1342 Last data filed at 01/16/14 1227  Gross per 24 hour  Intake   1450 ml  Output    300 ml  Net   1150 ml   Filed Weights   01/11/14 1759 01/12/14 0038  Weight: 140.615 kg (310 lb)  142.575 kg (314 lb 5.1 oz)    Exam:  General: Alert, awake, oriented x3, in no acute distress.  HEENT: No bruits, no goiter.  Heart: Regular rate and rhythm, without murmurs, rubs, gallops.  Lungs: Good air movement, clear. Abdomen: Soft, nontender, nondistended, positive bowel sounds.     Data Reviewed: Basic Metabolic Panel:  Recent Labs Lab 01/12/14 1538 01/13/14 0423 01/14/14 0405 01/14/14 1508 01/15/14 0455 01/16/14 0513  NA 134* 132* 135*  --  136* 138  K 3.0* 3.2* 3.1*  --  4.0 3.8  CL 95* 95* 98  --  101 103  CO2 22 20 21   --  19 20  GLUCOSE 102* 170* 94  --  164* 106*  BUN 38* 37* 39*  --  32* 28*  CREATININE 2.05* 1.98* 2.18*  --  1.93* 1.86*  CALCIUM 9.2 8.5 8.6  --  8.6 8.8  MG  --   --   --  2.5  --   --    Liver Function Tests: No results found for this basename: AST, ALT, ALKPHOS, BILITOT, PROT, ALBUMIN,  in the last 168 hours No results found for this basename: LIPASE, AMYLASE,  in the last 168 hours No results found for this basename: AMMONIA,  in the last 168 hours CBC:  Recent Labs Lab 01/11/14 1815  01/12/14 0805 01/13/14 0423 01/14/14 0405 01/15/14 0455 01/16/14 0513  WBC 18.2*  < > 11.0* 7.8 6.8 4.6 5.5  NEUTROABS 17.0*  --   --   --   --   --   --   HGB 8.0*  < > 7.9* 7.5* 7.6* 7.3* 7.5*  HCT 23.3*  < > 23.6* 22.7* 22.3* 22.5* 23.4*  MCV 87.9  < > 88.1 88.7 88.1 90.4 90.3  PLT 199  < > 170 183 182 193 194  < > = values in this interval not displayed. Cardiac Enzymes: No results found for this basename: CKTOTAL, CKMB, CKMBINDEX, TROPONINI,  in the last 168 hours BNP (last 3 results) No results found for this basename: PROBNP,  in the last 8760 hours CBG:  Recent Labs Lab 01/15/14 1204 01/15/14 1645 01/15/14 2139 01/16/14 0753 01/16/14 1207  GLUCAP 208* 160* 93 106* 107*    Recent Results (from the past 240 hour(s))  CULTURE, BLOOD (ROUTINE X 2)     Status: None   Collection Time    01/11/14 10:55 PM      Result Value Ref  Range Status   Specimen Description BLOOD RIGHT ARM   Final   Special Requests BOTTLES DRAWN AEROBIC AND ANAEROBIC 10CC EACH   Final   Culture  Setup Time     Final   Value: 01/12/2014 09:38     Performed at [facility]   Culture     Final   Value:        BLOOD CULTURE RECEIVED NO GROWTH TO DATE CULTURE WILL BE HELD FOR 5 DAYS BEFORE ISSUING A FINAL NEGATIVE REPORT     Performed at [facility]   Report Status PENDING   Incomplete  CULTURE, BLOOD (ROUTINE X 2)  Status: None   Collection Time    01/11/14 11:08 PM      Result Value Ref Range Status   Specimen Description BLOOD LEFT HAND   Final   Special Requests BOTTLES DRAWN AEROBIC AND ANAEROBIC [facility] EACH   Final   Culture  Setup Time     Final   Value: 01/12/2014 09:38     Performed at [facility]   Culture     Final   Value:        BLOOD CULTURE RECEIVED NO GROWTH TO DATE CULTURE WILL BE HELD FOR 5 DAYS BEFORE ISSUING A FINAL NEGATIVE REPORT     Performed at [facility]   Report Status PENDING   Incomplete  URINE CULTURE     Status: None   Collection Time    01/12/14 12:54 AM      Result Value Ref Range Status   Specimen Description URINE, CLEAN CATCH   Final   Special Requests NONE   Final   Culture  Setup Time     Final   Value: 01/12/2014 09:56     Performed at [place]     Final   Value: NO GROWTH     Performed at [facility]   Culture     Final   Value: NO GROWTH     Performed at [facility]   Report Status 01/13/2014 FINAL   Final  SURGICAL PCR SCREEN     Status: Abnormal   Collection Time    01/13/14  8:03 PM      Result Value Ref Range Status   MRSA, PCR POSITIVE (*) NEGATIVE Final   Staphylococcus aureus POSITIVE (*) NEGATIVE Final   Comment:            The Xpert SA Assay (FDA     approved for NASAL specimens     in patients over 60 years of age),     is one component of     a comprehensive surveillance     program.  Test  performance has     been validated by [employer] for patients greater     than or equal to 15 year old.     It is not intended     to diagnose infection nor to     guide or monitor treatment.  CLOSTRIDIUM DIFFICILE BY PCR     Status: None   Collection Time    01/15/14  8:23 PM      Result Value Ref Range Status   C difficile by pcr NEGATIVE  NEGATIVE Final     Studies: Ir Fluoro Guide Cv Line Right  01/14/2014   CLINICAL DATA:  Pseudomonas infection, needs venous access. Chronic kidney disease.  EXAM: RIGHT IJ CENTRAL VENOUS CATHETER PLACEMENT UNDER ULTRASOUND AND FLUOROSCOPIC GUIDANCE:  FLUOROSCOPY TIME:  6 seconds  TECHNIQUE: The procedure, risks (including but not limited to bleeding, infection, organ damage ), benefits, and alternatives were explained to the patient. Questions regarding the procedure were encouraged and answered. The patient understands and consents to the procedure. Patency of the right IJ vein was confirmed with ultrasound with image documentation. An appropriate skin site was determined. Skin site was marked. Region was prepped using maximum barrier technique including cap and mask, sterile gown, sterile gloves, large sterile sheet, and Chlorhexidine as cutaneous antisepsis. The region was infiltrated locally with 1% lidocaine. Under real-time ultrasound guidance, the right  IJ vein was accessed with a 21 gauge micropuncture needle; the needle tip within the vein was confirmed with ultrasound image documentation. The needle exchanged over a guidewire for peel-away  sheath through which an 24cm 5 [place] PowerPICC Single lumen catheter was  advanced. This was positioned with the tip at the cavoatrial junction. Spot  chest radiograph shows good positioning and no pneumothorax. Catheter was flushed  and sutured externally with 0-Prolene sutures. Patient tolerated the procedure  well, with no immediate complication.  IMPRESSION: 1. Technically successful right IJ Single  lumenpower injectable central venous catheter placement.   Electronically Signed   By: Arne Cleveland M.D.   On: 01/14/2014 14:59   Ir US Guide Vasc Access Right  01/14/2014   CLINICAL DATA:  Pseudomonas infection, needs venous access. Chronic kidney disease.  EXAM: RIGHT IJ CENTRAL VENOUS CATHETER PLACEMENT UNDER ULTRASOUND AND FLUOROSCOPIC GUIDANCE:  FLUOROSCOPY TIME:  6 seconds  TECHNIQUE: The procedure, risks (including but not limited to bleeding, infection, organ damage ), benefits, and alternatives were explained to the patient. Questions regarding the procedure were encouraged and answered. The patient understands and consents to the procedure. Patency of the right IJ vein was confirmed with ultrasound with image documentation. An appropriate skin site was determined. Skin site was marked. Region was prepped using maximum barrier technique including cap and mask, sterile gown, sterile gloves, large sterile sheet, and Chlorhexidine as cutaneous antisepsis. The region was infiltrated locally with 1% lidocaine. Under real-time ultrasound guidance, the right IJ vein was accessed with a 21 gauge micropuncture needle; the needle tip within the vein was confirmed with ultrasound image documentation. The needle exchanged over a guidewire for peel-away  sheath through which an 24cm 5 [place] PowerPICC Single lumen catheter was  advanced. This was positioned with the tip at the cavoatrial junction. Spot  chest radiograph shows good positioning and no pneumothorax. Catheter was flushed  and sutured externally with 0-Prolene sutures. Patient tolerated the procedure  well, with no immediate complication.  IMPRESSION: 1. Technically successful right IJ Single lumenpower injectable central venous catheter placement.   Electronically Signed   By: Arne Cleveland M.D.   On: 01/14/2014 14:59    Scheduled Meds: . acetic acid   Irrigation Daily  . acidophilus  2 capsule Oral BID  . ceFEPime (MAXIPIME) IV  2 g  Intravenous Q24H  . Chlorhexidine Gluconate Cloth  6 each Topical Q0600  . heparin  5,000 Units Subcutaneous 3 times per day  . insulin aspart  0-9 Units Subcutaneous TID WC  . insulin aspart protamine- aspart  80 Units Subcutaneous BID WC  . iron polysaccharides  150 mg Oral BID  . latanoprost  1 drop Both Eyes QHS  . mupirocin ointment  1 application Topical Daily  . mupirocin ointment  1 application Nasal BID  . timolol  1 drop Both Eyes Daily  . vancomycin  1,250 mg Intravenous Q24H   Continuous Infusions:    [facility] Pager [phone]. If 8PM-8AM, please contact night-coverage at [email or web address], password [facility] 01/16/2014, 1:42 PM  LOS: 5 days

## 2014-01-17 LAB — GLUCOSE, CAPILLARY
GLUCOSE-CAPILLARY: 112 mg/dL — AB (ref 70–99)
Glucose-Capillary: 114 mg/dL — ABNORMAL HIGH (ref 70–99)
Glucose-Capillary: 124 mg/dL — ABNORMAL HIGH (ref 70–99)
Glucose-Capillary: 90 mg/dL (ref 70–99)

## 2014-01-17 LAB — CBC
HCT: 24.5 % — ABNORMAL LOW (ref 36.0–46.0)
Hemoglobin: 7.8 g/dL — ABNORMAL LOW (ref 12.0–15.0)
MCH: 29.2 pg (ref 26.0–34.0)
MCHC: 31.8 g/dL (ref 30.0–36.0)
MCV: 91.8 fL (ref 78.0–100.0)
PLATELETS: 201 10*3/uL (ref 150–400)
RBC: 2.67 MIL/uL — ABNORMAL LOW (ref 3.87–5.11)
RDW: 17.6 % — AB (ref 11.5–15.5)
WBC: 5.3 10*3/uL (ref 4.0–10.5)

## 2014-01-17 LAB — BASIC METABOLIC PANEL
BUN: 19 mg/dL (ref 6–23)
CALCIUM: 8.9 mg/dL (ref 8.4–10.5)
CO2: 22 mEq/L (ref 19–32)
CREATININE: 1.5 mg/dL — AB (ref 0.50–1.10)
Chloride: 105 mEq/L (ref 96–112)
GFR calc Af Amer: 40 mL/min — ABNORMAL LOW (ref >90)
GFR calc non Af Amer: 34 mL/min — ABNORMAL LOW (ref >90)
GLUCOSE: 113 mg/dL — AB (ref 70–99)
Potassium: 4.2 mEq/L (ref 3.7–5.3)
Sodium: 138 mEq/L (ref 137–147)

## 2014-01-17 MED ORDER — HEPARIN SOD (PORK) LOCK FLUSH 100 UNIT/ML IV SOLN
250.0000 [IU] | INTRAVENOUS | Status: AC | PRN
  Administered 2014-01-17: 250 [IU]
  Filled 2014-01-17: qty 3

## 2014-01-17 MED ORDER — BUMETANIDE 2 MG PO TABS: 2.0000 mg | ORAL_TABLET | Freq: Two times a day (BID) | ORAL | Status: DC

## 2014-01-17 MED ORDER — DEXTROSE 5 % IV SOLN: 2.0000 g | INTRAVENOUS | Status: DC

## 2014-01-17 MED ORDER — SODIUM CHLORIDE 0.9 % IJ SOLN
10.0000 mL | INTRAMUSCULAR | Status: DC | PRN
  Administered 2014-01-17 (×2): 10 mL

## 2014-01-17 MED ORDER — HYDROCHLOROTHIAZIDE 25 MG PO TABS
25.0000 mg | ORAL_TABLET | Freq: Every day | ORAL | Status: DC
  Administered 2014-01-17: 25 mg via ORAL
  Filled 2014-01-17: qty 1

## 2014-01-17 MED ORDER — BUMETANIDE 2 MG PO TABS
2.0000 mg | ORAL_TABLET | Freq: Two times a day (BID) | ORAL | Status: DC
  Administered 2014-01-17: 2 mg via ORAL
  Filled 2014-01-17 (×2): qty 1

## 2014-01-17 MED ORDER — HYDROCHLOROTHIAZIDE 25 MG PO TABS: 25.0000 mg | ORAL_TABLET | Freq: Every day | ORAL | Status: DC

## 2014-01-17 MED ORDER — SODIUM CHLORIDE 0.9 % IV SOLN: 1250.0000 mg | INTRAVENOUS | Status: DC

## 2014-01-17 NOTE — Discharge Summary (Signed)
Physician Discharge Summary  [name] YOV:[id number] DOB: March 15, 1946 DOA: 01/11/2014  PCP: Shirline Frees, MD  Admit date: 01/11/2014 Discharge date: 01/17/2014  Time spent: 35 minutes  Recommendations for Outpatient Follow-up:  1. Follow up with ID in 2 week. 2. Home health for antibiotics 3. Follow up with Dr. Migdalia Dk in 1 week. 4. Will be d/c on vanc protocol as an outpatinet. 5. Follow up with PCP check a CBC, refer to GI for colonoscopy.  Discharge Diagnoses:  Principal Problem:   Venous stasis ulcer of left lower extremity Active Problems:   Type II or unspecified type diabetes mellitus with unspecified complication, uncontrolled   Ulcer of left lower leg   CKD (chronic kidney disease), stage III   Anemia   Diabetic leg ulcer   PAF- new onset   Morbid obesity- BMI 49   Discharge Condition: stable  Diet recommendation: carb modified  Filed Weights   01/11/14 1759 01/12/14 0038  Weight: 140.615 kg (310 lb) 142.575 kg (314 lb 5.1 oz)    History of present illness:  68 yrs old wf here for treatment of a left leg chronic venous stasis ulcer. She has been dealing with the wound for the past several years. She was followed at the [facility] and then at [facility] most recently. A culture in the past few weeks was positive for pseudomonas and doxy was started. She developed cellulitis and was admitted for IV antibiotics. The wound has gotten significantly worse over the past week   Hospital Course:  Sepsis secondary to Left leg infected (pseudomonas) chronic venous stasis ulcer/cellulitis:  - Infectious disease and plastic surgery was consulted. Patient was empirically started on IV Zosyn and vancomycin. She underwent irrigation and debridement of left leg ulcer and placement of wound VAC in the OR on 5/6.  - She will need prolonged IV antibiotics.  - Status post tunneled central line placement 5/6 (no PICC secondary to chronic kidney disease).  - ID recommend IV Vanc and cefepime.   - Follow up with ID and Plastic as an outpatient.  DM 2:  - Continue home 70/30 insulin.  - no change.  Acute on Chronic kidney disease:  - Baseline creatinine in the range of 1.7. Patient was able to pull up her labs on my chart on 5/5 and in December 2014, creatinine was 1.77 and hemoglobin 11 g per DL suggesting current creatinine probably is close to her baseline.  - She did not followup with Dr. Moshe Cipro, nephrology due to financial issues. - Prerenal-improved after brief IV fluids.  - started her back on HCTZ and bumex, she relates she has been on this regimen for about 3 years. - will follow up with nephrology as an outpatient.  Anemia:  - Likely secondary to chronic kidney disease and iron deficiency .  - stable, cont oral iron therapy.   Chronic venous stasis:  - Usually takes HCTZ and bumetanide at home.  - follow up with Plastic and reconstructive surgery.  Diarrhea:  - Probably related to antibiotics. Continue probiotics.? Antimotility agents.   Paroxysmal A. Fib:  - Patient denies prior history of A. fib diagnosis, HTN or strokes.  - consulted cards recommended no anticoagulation at this time.  - follow up with them as an outpatient.  - will need GI evaluation as an outpatinet.   Procedures:  Left chronic venous stasis debridement and wound vac placement on 5.6.2015.  Consultations:  ID  plastic  Discharge Exam: Filed Vitals:   01/17/14 0616  BP:  151/50  Pulse: 64  Temp: 97.6 F (36.4 C)  Resp: 20    General: A&O x3 Cardiovascular: RRR Respiratory: good air movement CTA B/L  Discharge Instructions You were cared for by a hospitalist during your hospital stay. If you have any questions about your discharge medications or the care you received while you were in the hospital after you are discharged, you can call the unit and asked to speak with the hospitalist on call if the hospitalist that took care of you is not available. Once you are  discharged, your primary care physician will handle any further medical issues. Please note that NO REFILLS for any discharge medications will be authorized once you are discharged, as it is imperative that you return to your primary care physician (or establish a relationship with a primary care physician if you do not have one) for your aftercare needs so that they can reassess your need for medications and monitor your lab values.  Discharge Orders   Future Appointments Provider Department Dept Phone   01/19/2014 1:00 PM [place] [phone]   02/16/2014 2:45 PM Campbell Riches, MD [facility] for Infectious Disease [phone]   Future Orders Complete By Expires   Diet - low sodium heart healthy  As directed    Increase activity slowly  As directed        Medication List    STOP taking these medications       doxycycline 50 MG capsule  Commonly known as:  VIBRAMYCIN      TAKE these medications       acetic acid 0.25 % irrigation  Irrigate with as directed daily.     bumetanide 2 MG tablet  Commonly known as:  BUMEX  Take 2 mg by mouth 2 (two) times daily.     ceFEPIme 2 g in dextrose 5 % 50 mL  Inject 2 g into the vein daily.     COD LIVER OIL PO  Take 1,000 mg by mouth daily.     glipiZIDE 10 MG tablet  Commonly known as:  GLUCOTROL  Take 10 mg by mouth 2 (two) times daily before a meal.     hydrochlorothiazide 25 MG tablet  Commonly known as:  HYDRODIURIL  Take 25 mg by mouth daily.     hydrOXYzine 50 MG tablet  Commonly known as:  ATARAX/VISTARIL  Take 50 mg by mouth 3 (three) times daily as needed for itching.     insulin NPH-regular Human (70-30) 100 UNIT/ML injection  Commonly known as:  NOVOLIN 70/30  Inject 80 Units into the skin 2 (two) times daily with a meal.     insulin regular 100 units/mL injection  Commonly known as:  NOVOLIN R,HUMULIN R  Inject 0-20 Units into the skin  daily as needed for high blood sugar. Sliding scale     iron polysaccharides 150 MG capsule  Commonly known as:  NIFEREX  Take 150 mg by mouth 2 (two) times daily.     lactobacillus acidophilus Tabs tablet  Take 2 tablets by mouth daily after breakfast.     latanoprost 0.005 % ophthalmic solution  Commonly known as:  XALATAN  Place 1 drop into both eyes at bedtime.     liver oil-zinc oxide 40 % ointment  Commonly known as:  DESITIN  Apply 1 application topically daily.     mupirocin ointment 2 %  Commonly known as:  [employer]  1 application topically daily.     potassium chloride 10 MEQ tablet  Commonly known as:  K-DUR,KLOR-CON  Take 10 mEq by mouth 2 (two) times daily.     timolol 0.5 % ophthalmic solution  Commonly known as:  TIMOPTIC  Place 1 drop into both eyes daily.     traMADol 50 MG tablet  Commonly known as:  ULTRAM  Take 50 mg by mouth every 6 (six) hours as needed for moderate pain.     vancomycin 1,250 mg in sodium chloride 0.9 % 250 mL  Inject 1,250 mg into the vein daily.     vitamin C 500 MG tablet  Commonly known as:  ASCORBIC ACID  Take 500 mg by mouth 2 (two) times daily.       Allergies  Allergen Reactions  . Bactrim [Sulfamethoxazole-Tmp Ds] Shortness Of Breath    Depression based reactions, altered mentality  . Atorvastatin Other (See Comments)    aches  . Sulfamethoxazole-Trimethoprim Swelling  . Actos [Pioglitazone Hydrochloride] Other (See Comments)    unknown  . Celebrex [Celecoxib] Other (See Comments) and Rash    unknown  . Ibuprofen Other (See Comments) and Rash  . Latex Rash  . Neosporin [Neomycin-Bacitracin Zn-Polymyx] Rash  . Pioglitazone Rash       Follow-up Information   Follow up with [facility]              On 01/19/2014. (Appointment at 12:45 pm with Dr. Irene Limbo, Dr. Leafy Ro partner)    Contact information:   [address] [place]  [address] [phone]      Follow up with Dorothy Spark, MD. (office will call you)    Specialty:  Cardiology   Contact information:   [address] [place] [address] [phone]       Follow up with [facility], DO In 1 week. (hospital follow up)    Specialty:  Plastic Surgery   Contact information:   [place] [address] [phone]       Follow up with Bobby Rumpf, MD In 2 weeks. (hospital follow up)    Specialty:  Infectious Diseases   Contact information:   [address]  [address] [place] [address] [phone]        The results of significant diagnostics from this hospitalization (including imaging, microbiology, ancillary and laboratory) are listed below for reference.    Significant Diagnostic Studies: Dg Chest 2 View  01/11/2014   CLINICAL DATA:  Fever  EXAM: CHEST  2 VIEW  COMPARISON:  DG CHEST 2 VIEW dated 08/29/2011  FINDINGS: There is no focal parenchymal opacity, pleural effusion, or pneumothorax. There is stable cardiomegaly.  There is mild thoracic spine spondylosis.  IMPRESSION: No active cardiopulmonary disease.   Electronically Signed   By: Kathreen Devoid   On: 01/11/2014 21:57   Ir Fluoro Guide Cv Line Right  01/14/2014   CLINICAL DATA:  Pseudomonas infection, needs venous access. Chronic kidney disease.  EXAM: RIGHT IJ CENTRAL VENOUS CATHETER PLACEMENT UNDER ULTRASOUND AND FLUOROSCOPIC GUIDANCE:  FLUOROSCOPY TIME:  6 seconds  TECHNIQUE: The procedure, risks (including but not limited to bleeding, infection, organ damage ), benefits, and alternatives were explained to the patient. Questions regarding the procedure were encouraged and answered. The patient understands and consents to the procedure. Patency of the right IJ vein was confirmed with ultrasound with image documentation. An appropriate  skin site was determined. Skin site was marked. Region was prepped using maximum  barrier technique including cap and mask, sterile gown, sterile gloves, large sterile sheet, and Chlorhexidine as cutaneous antisepsis. The region was infiltrated locally with 1% lidocaine. Under real-time ultrasound guidance, the right IJ vein was accessed with a 21 gauge micropuncture needle; the needle tip within the vein was confirmed with ultrasound image documentation. The needle exchanged over a guidewire for peel-away  sheath through which an 24cm 5 [place] PowerPICC Single lumen catheter was  advanced. This was positioned with the tip at the cavoatrial junction. Spot  chest radiograph shows good positioning and no pneumothorax. Catheter was flushed  and sutured externally with 0-Prolene sutures. Patient tolerated the procedure  well, with no immediate complication.  IMPRESSION: 1. Technically successful right IJ Single lumenpower injectable central venous catheter placement.   Electronically Signed   By: Arne Cleveland M.D.   On: 01/14/2014 14:59   Ir US Guide Vasc Access Right  01/14/2014   CLINICAL DATA:  Pseudomonas infection, needs venous access. Chronic kidney disease.  EXAM: RIGHT IJ CENTRAL VENOUS CATHETER PLACEMENT UNDER ULTRASOUND AND FLUOROSCOPIC GUIDANCE:  FLUOROSCOPY TIME:  6 seconds  TECHNIQUE: The procedure, risks (including but not limited to bleeding, infection, organ damage ), benefits, and alternatives were explained to the patient. Questions regarding the procedure were encouraged and answered. The patient understands and consents to the procedure. Patency of the right IJ vein was confirmed with ultrasound with image documentation. An appropriate skin site was determined. Skin site was marked. Region was prepped using maximum barrier technique including cap and mask, sterile gown, sterile gloves, large sterile sheet, and Chlorhexidine as cutaneous antisepsis. The region was infiltrated locally with 1% lidocaine. Under real-time ultrasound guidance, the right IJ vein was accessed with a  21 gauge micropuncture needle; the needle tip within the vein was confirmed with ultrasound image documentation. The needle exchanged over a guidewire for peel-away  sheath through which an 24cm 5 [place] PowerPICC Single lumen catheter was  advanced. This was positioned with the tip at the cavoatrial junction. Spot  chest radiograph shows good positioning and no pneumothorax. Catheter was flushed  and sutured externally with 0-Prolene sutures. Patient tolerated the procedure  well, with no immediate complication.  IMPRESSION: 1. Technically successful right IJ Single lumenpower injectable central venous catheter placement.   Electronically Signed   By: Arne Cleveland M.D.   On: 01/14/2014 14:59    Microbiology: Recent Results (from the past 240 hour(s))  CULTURE, BLOOD (ROUTINE X 2)     Status: None   Collection Time    01/11/14 10:55 PM      Result Value Ref Range Status   Specimen Description BLOOD RIGHT ARM   Final   Special Requests BOTTLES DRAWN AEROBIC AND ANAEROBIC 10CC EACH   Final   Culture  Setup Time     Final   Value: 01/12/2014 09:38     Performed at [facility]   Culture     Final   Value:        BLOOD CULTURE RECEIVED NO GROWTH TO DATE CULTURE WILL BE HELD FOR 5 DAYS BEFORE ISSUING A FINAL NEGATIVE REPORT     Performed at [facility]   Report Status PENDING   Incomplete  CULTURE, BLOOD (ROUTINE X 2)     Status: None   Collection Time    01/11/14 11:08 PM      Result Value Ref Range Status   Specimen  Description BLOOD LEFT HAND   Final   Special Requests BOTTLES DRAWN AEROBIC AND ANAEROBIC [facility] EACH   Final   Culture  Setup Time     Final   Value: 01/12/2014 09:38     Performed at [facility]   Culture     Final   Value:        BLOOD CULTURE RECEIVED NO GROWTH TO DATE CULTURE WILL BE HELD FOR 5 DAYS BEFORE ISSUING A FINAL NEGATIVE REPORT     Performed at [facility]   Report Status PENDING   Incomplete  URINE CULTURE     Status: None    Collection Time    01/12/14 12:54 AM      Result Value Ref Range Status   Specimen Description URINE, CLEAN CATCH   Final   Special Requests NONE   Final   Culture  Setup Time     Final   Value: 01/12/2014 09:56     Performed at [facility]     Final   Value: NO GROWTH     Performed at [facility]   Culture     Final   Value: NO GROWTH     Performed at [facility]   Report Status 01/13/2014 FINAL   Final  SURGICAL PCR SCREEN     Status: Abnormal   Collection Time    01/13/14  8:03 PM      Result Value Ref Range Status   MRSA, PCR POSITIVE (*) NEGATIVE Final   Staphylococcus aureus POSITIVE (*) NEGATIVE Final   Comment:            The Xpert SA Assay (FDA     approved for NASAL specimens     in patients over 56 years of age),     is one component of     a comprehensive surveillance     program.  Test performance has     been validated by [employer] for patients greater     than or equal to 44 year old.     It is not intended     to diagnose infection nor to     guide or monitor treatment.  CLOSTRIDIUM DIFFICILE BY PCR     Status: None   Collection Time    01/15/14  8:23 PM      Result Value Ref Range Status   C difficile by pcr NEGATIVE  NEGATIVE Final     Labs: Basic Metabolic Panel:  Recent Labs Lab 01/13/14 0423 01/14/14 0405 01/14/14 1508 01/15/14 0455 01/16/14 0513 01/17/14 0500  NA 132* 135*  --  136* 138 138  K 3.2* 3.1*  --  4.0 3.8 4.2  CL 95* 98  --  101 103 105  CO2 20 21  --  19 20 22   GLUCOSE 170* 94  --  164* 106* 113*  BUN 37* 39*  --  32* 28* 19  CREATININE 1.98* 2.18*  --  1.93* 1.86* 1.50*  CALCIUM 8.5 8.6  --  8.6 8.8 8.9  MG  --   --  2.5  --   --   --    Liver Function Tests: No results found for this basename: AST, ALT, ALKPHOS, BILITOT, PROT, ALBUMIN,  in the last 168 hours No results found for this basename: LIPASE, AMYLASE,  in the last 168 hours No results found for this basename:  AMMONIA,  in  the last 168 hours CBC:  Recent Labs Lab 01/11/14 1815  01/13/14 0423 01/14/14 0405 01/15/14 0455 01/16/14 0513 01/17/14 0500  WBC 18.2*  < > 7.8 6.8 4.6 5.5 5.3  NEUTROABS 17.0*  --   --   --   --   --   --   HGB 8.0*  < > 7.5* 7.6* 7.3* 7.5* 7.8*  HCT 23.3*  < > 22.7* 22.3* 22.5* 23.4* 24.5*  MCV 87.9  < > 88.7 88.1 90.4 90.3 91.8  PLT 199  < > 183 182 193 194 201  < > = values in this interval not displayed. Cardiac Enzymes: No results found for this basename: CKTOTAL, CKMB, CKMBINDEX, TROPONINI,  in the last 168 hours BNP: BNP (last 3 results) No results found for this basename: PROBNP,  in the last 8760 hours CBG:  Recent Labs Lab 01/16/14 2100 01/16/14 2222 01/17/14 0007 01/17/14 0205 01/17/14 0828  GLUCAP 49* 92 112* 114* 124*       Signed:  Charlynne Cousins  [facility] 01/17/2014, 9:03 AM

## 2014-01-17 NOTE — Progress Notes (Signed)
Discharge home. Home discharge instruction given, case manager aware of the discharge and will notify advance about home needs. Vac intact, leak aalrm noted on the portable wound vac, vac dressing checked and reinforced. Leak alarm gone upon discharge.

## 2014-01-18 LAB — CULTURE, BLOOD (ROUTINE X 2)
CULTURE: NO GROWTH
Culture: NO GROWTH

## 2014-01-19 ENCOUNTER — Encounter (HOSPITAL_BASED_OUTPATIENT_CLINIC_OR_DEPARTMENT_OTHER): Payer: Medicare Other | Attending: Plastic Surgery

## 2014-01-19 DIAGNOSIS — I872 Venous insufficiency (chronic) (peripheral): Secondary | ICD-10-CM | POA: Insufficient documentation

## 2014-01-19 DIAGNOSIS — E119 Type 2 diabetes mellitus without complications: Secondary | ICD-10-CM | POA: Insufficient documentation

## 2014-01-19 DIAGNOSIS — E669 Obesity, unspecified: Secondary | ICD-10-CM | POA: Insufficient documentation

## 2014-01-19 DIAGNOSIS — L97809 Non-pressure chronic ulcer of other part of unspecified lower leg with unspecified severity: Secondary | ICD-10-CM | POA: Insufficient documentation

## 2014-01-19 NOTE — Progress Notes (Signed)
Agree with the above plan 

## 2014-01-20 NOTE — Consult Note (Signed)
[name], [name]               ACCOUNT NO.:  [id number]  MEDICAL RECORD NO.:  [id number]  LOCATION:  FOOT                         FACILITY:  [facility]  PHYSICIAN:  Irene Limbo, MD   DATE OF BIRTH:  [birth year]  DATE OF CONSULTATION:  01/19/2014                                 CONSULTATION   CHIEF COMPLAINT:  Chronic left lower extremity ulceration in the setting of venous stasis , obesity, and diabetes mellitus.  HISTORY OF PRESENT ILLNESS:  The patient is a 68 year old ambulatory female with over 2 year history of left lower extremity ulceration.  She has been a previous [facility] patient here in 2010 and most recently was seeing Dr. Zigmund Daniel at [facility].  She has undergone multiple previous interventions including Apligraf and has had Profore Lite in place.  She has been most recently under care of [facility] and was receiving Dakin's solution to her left leg wound, however she required admission for sepsis a week ago.  She was placed on IV antibiotics and underwent operative debridement with placement of ACell by Dr. Migdalia Dk on Jan 14, 2014.  She was discharged with wound VAC, and vancomycin and cefepime for at least 2 weeks per Infectious Disease recommendation.  She has not had any compression since the time of surgery.  PAST MEDICAL HISTORY:  Includes: 1. Diabetes mellitus. 2. Morbid obesity. 3. Chronic kidney disease. 4. Anemia of chronic disease. 5. Hypercholesterolemia. 6. History of rheumatic fever. 7. Peripheral vascular disease. 8. Peripheral neuropathy. 9. Osteoarthritis.  PAST SURGICAL HISTORY:  Includes tonsillectomy and debridement of left leg with application of A Cell.  SOCIAL HISTORY:  The patient was never a smoker.  STUDIES:  The patient had both venous reflex and perfusion study done in September 2013.  Her ABI was normal.  Over her left lower extremity, she was noted to have both deep and superficial venous  insufficiency involving the common femoral and above and below-knee saphenous veins as well as the small saphenous vein.  She underwent consultation with Vascular Surgery in 2013, at [facility], who did not feel she was a surgical candidate, given the location of her wound and her inability to tolerate compression at that time.  MEDICATIONS:  Include vancomycin, cefepime, vitamin C, Bumex, glipizide, hydroxyzine, hydrochlorothiazide, Novolin, Humulin, latanoprost, potassium chloride, timolol, and tramadol and potassium chloride.  LABORATORY:  Wound culture from March 2015, included Pseudomonas and Coag-negative Staph.  Creatinine dated Jan 17, 2014, include creatinine of 1.5.  Hemoglobin of 7.8, platelets of 201.  She has no recent hemoglobin A1c.  PHYSICAL EXAMINATION:  NEUROLOGIC:  Sensory exam was not completed over her left foot. VITAL SIGNS:  Height is 5 feet 7 inches, weight is 310 pounds, blood pressure is 136/72, pulse 65.  Blood glucose is 135.  Temperature is 98.1.  Left calf measures 57.5 cm, left ankle is 32 cm.  Left lower extremity has open wound measured as 10 x 9.5 x 0.1 cm.  The ACell is in place. She has significant amount of pitting edema over her entire lower extremity with serous fluid draining.  There is no cellulitis present.  ASSESSMENT:  Chronic  left lower extremity ulceration.  Patient plans to continue to follow up in the [facility].  We will reinstitute Profore compression and continue the VAC at 125 mmHg.  She will need a mid-week dressing change of the wrap and orders were faxed to [facility], which is now her home health agency.  She will plan for follow up in 1 week's time.          ______________________________ Irene Limbo, MD MBA     BT/MEDQ  D:  01/19/2014  T:  01/20/2014  Job:  [id number]

## 2014-01-21 ENCOUNTER — Telehealth: Payer: Self-pay | Admitting: Infectious Diseases

## 2014-01-21 ENCOUNTER — Encounter: Payer: Self-pay | Admitting: *Deleted

## 2014-01-21 ENCOUNTER — Telehealth: Payer: Self-pay | Admitting: *Deleted

## 2014-01-21 NOTE — Telephone Encounter (Signed)
Please see phone note frm ms poole

## 2014-01-21 NOTE — Telephone Encounter (Addendum)
Patient called and advised she missed a call from the doctor. Per Dr Johnnye Sima I advised her to call her PCP and try to be seen today also advised her to hold her antibiotics until she is seen somewhere. She thinks she can get into her PCP but if not she will call our office back.

## 2014-01-21 NOTE — Telephone Encounter (Signed)
She needs to be seen - PCP, urgent care, ID if appt available.  Would hold her antibiotics until she has been evaluated.

## 2014-01-21 NOTE — Telephone Encounter (Signed)
Error

## 2014-01-21 NOTE — Telephone Encounter (Signed)
Patient called back to say that her PCP will see her today 01/21/14 at 4 pm. Reminded her to not take her medication until she sees him.

## 2014-01-21 NOTE — Telephone Encounter (Signed)
Patient called to report that she has developed a rash that started last night under her breast and now is on her chest and back. She states that the rash is itchy and pimply. She also reports that she noticed pain and stiffness in her R jaw this morning and her diarrhea continues. She is not sure what to do but felt she needed to call the office and report her symptoms. She also states that [facility] is not coming out today but should be out tomorrow 01/22/14 in the afternoon. No fever chills, sweats, dizziness or shortness of breath to report. Advised her will inform Dr Johnnye Sima and give her a call back with his recommendation.

## 2014-01-22 ENCOUNTER — Telehealth: Payer: Self-pay

## 2014-01-22 NOTE — Telephone Encounter (Signed)
Patient was seen by Primary Care physician  yesterday as directed by Dr Johnnye Sima for possible reaction to Vancomycin.   She was given prednisone and Atarax and diagnosed with allergic reaction to Vancomycin. Currently  She is not on any antibiotics at this time.  Her next office vivit is not until February 16, 2014. Please advise treatment plan?  Is it okay to wait until office visit.   Laverle Patter, RN  Vancomycin allergy added to list .

## 2014-01-22 NOTE — Telephone Encounter (Signed)
Please have her seen in next week if possible Could add her at 8:45 on Monday or Wednesday if needed, with me if no one else available. thanks

## 2014-01-26 ENCOUNTER — Encounter: Payer: Self-pay | Admitting: Infectious Diseases

## 2014-01-26 ENCOUNTER — Ambulatory Visit (INDEPENDENT_AMBULATORY_CARE_PROVIDER_SITE_OTHER): Payer: Medicare Other | Admitting: Infectious Diseases

## 2014-01-26 VITALS — BP 133/78 | HR 58 | Temp 97.8°F

## 2014-01-26 DIAGNOSIS — I83009 Varicose veins of unspecified lower extremity with ulcer of unspecified site: Secondary | ICD-10-CM

## 2014-01-26 DIAGNOSIS — I83029 Varicose veins of left lower extremity with ulcer of unspecified site: Secondary | ICD-10-CM

## 2014-01-26 DIAGNOSIS — L97929 Non-pressure chronic ulcer of unspecified part of left lower leg with unspecified severity: Secondary | ICD-10-CM

## 2014-01-26 DIAGNOSIS — L97909 Non-pressure chronic ulcer of unspecified part of unspecified lower leg with unspecified severity: Secondary | ICD-10-CM

## 2014-01-26 DIAGNOSIS — T50905A Adverse effect of unspecified drugs, medicaments and biological substances, initial encounter: Secondary | ICD-10-CM | POA: Insufficient documentation

## 2014-01-26 DIAGNOSIS — T887XXA Unspecified adverse effect of drug or medicament, initial encounter: Secondary | ICD-10-CM

## 2014-01-26 MED ORDER — FAMOTIDINE 40 MG PO TABS: 40.0000 mg | ORAL_TABLET | Freq: Every day | ORAL | Status: DC

## 2014-01-26 MED ORDER — PREDNISONE (PAK) 10 MG PO TABS: ORAL_TABLET | Freq: Every day | ORAL | Status: DC

## 2014-01-26 NOTE — Progress Notes (Signed)
Per Dr Johnnye Sima  24 cm 5 [place] [employer] PICC removed.  Sutures intact and site unremarkable. Sutures removed.  Dressing was clean and dry . Area cleansed with chlorhexidine and petroleum dressing applied. Pt advised no heavy lifting with this arm, leave dressing for 24 hours and call the office if dressing becomes soaked with blood or sharp pain presents.  Pt tolerated procedure well.    Laverle Patter, RN

## 2014-01-26 NOTE — Progress Notes (Signed)
   Subjective:    Patient ID: [name], female    DOB: 1945-10-12, 68 y.o.   MRN: [id number]  Rash Pertinent negatives include no cough, diarrhea, fever, shortness of breath or sore throat.  Diarrhea  Pertinent negatives include no chills, coughing or fever.   68 yo F with DM2, morbid obesity (BMI 49), chronic LLE ulcer. Prev pseudomonas (R- aztreonam, cipro) She was admtted 5-3 to 5-7 with worsening of this ulcer. She was evaluated by [facility] and had I &D, placement of VAC on 5-6. She was d/c home with vanco/cefepime. By 5-13 she had developed a diffuse erythematous rash on her trunk. That has become pruritic, and peeling.  Feels like this is due to vanco. Had her infusion rate increased around this time as well. Had noted greenish d/c around her PIC site over the last 2 days.   Has been taking atarax, alternating with benadryl. Also taking tramadol, tylenol.  Was seen by PCP 5-15 and was given steroid blast. 40mg /day for 5 days, today to start 20mg /day for 5 days.   Review of Systems  Constitutional: Negative for fever and chills.  HENT: Negative for mouth sores and sore throat.   Respiratory: Negative for cough and shortness of breath.   Gastrointestinal: Negative for diarrhea.  Skin: Positive for rash.       Objective:   Physical Exam  Constitutional: She appears well-developed and well-nourished.  HENT:  Mouth/Throat: No oropharyngeal exudate.  Eyes: EOM are normal. Pupils are equal, round, and reactive to light.  Cardiovascular: Normal rate, regular rhythm and normal heart sounds.   Pulmonary/Chest: Effort normal and breath sounds normal.    Abdominal: Soft. Bowel sounds are normal. She exhibits no distension. There is no tenderness.  Skin: Rash noted. Rash is urticarial.             Assessment & Plan:

## 2014-01-26 NOTE — Assessment & Plan Note (Signed)
Not clear if this is from vanco or cefepime Will stop all anbx See her back in 1 week Add H2 blocker Change steroids to slower taper

## 2014-01-26 NOTE — Addendum Note (Signed)
Addended by: Orland Mustard K on: 01/26/2014 11:40 AM   Modules accepted: Orders

## 2014-01-26 NOTE — Assessment & Plan Note (Addendum)
2 weeks is a short course but will stop all anbx due to ADR See try to see her back in 1 week F/u [facility]

## 2014-01-27 NOTE — Progress Notes (Signed)
[facility]  [name], [name]               ACCOUNT NO.:  [id number]  MEDICAL RECORD NO.:  [id number]      DATE OF BIRTH:  04-Feb-1946  PHYSICIAN:  Irene Limbo, MD    VISIT DATE:  01/26/2014                                  OFFICE VISIT   CHIEF COMPLAINT:  Follow up of left lower extremity ulceration in the setting of venous stasis, obesity, and diabetes mellitus.  HISTORY OF PRESENT ILLNESS:  The patient is a 68 year old ambulatory female who is 2 weeks postoperative from debridement of left lower extremity and application of ACell and wound VAC by Dr. Migdalia Dk.  She was discharged on IV antibiotics.  In the interim since her last visit here, she developed a rash secondary to her vancomycin and this has been discontinued as well as other antibiotic and her port has been removed. She was seen by her primary care physician and placed on steroids as well as Atarax and then seen by Dr. Johnnye Sima from Infectious Disease today and additional steroid has been prescribed.  She currently has wound VAC in place in addition to Profore wrap.  She developed secondary blister over the posterior calf secondary to initial dressing post surgery.  This was treated with triamcinolone alone at her last visit.  PHYSICAL EXAMINATION:  Blood pressure is 133/74, pulse is 64, temp is 97.7.  Blood sugar 186.  Over her left lateral lower extremity, she has open wound that measures 9.8 x 9.5 x 0.1 cm.  There is still ACell present in the wound and no debridement was performed.  Over the left posterior calf, there is an open wound measured at 2.4 x 5.5 x 0.1 cm, we will classify this secondary to trauma and she notes that this is secondary to a blister from her wrap. This wound has also completely granulated and no debridement was performed.  ASSESSMENT:  We will continue with wound VAC, Hydrogel, and Adaptic over the ACell over her left lower extremity wound over her calf wound  with collagen and we will continue with layered compression wrap.  She will follow up in 2 weeks' time either with myself or with Dr. Migdalia Dk.          ______________________________ Irene Limbo, MD     BT/MEDQ  D:  01/26/2014  T:  01/27/2014  Job:  [id number]

## 2014-02-04 ENCOUNTER — Encounter: Payer: Self-pay | Admitting: Infectious Diseases

## 2014-02-04 ENCOUNTER — Ambulatory Visit (INDEPENDENT_AMBULATORY_CARE_PROVIDER_SITE_OTHER): Payer: Medicare Other | Admitting: Infectious Diseases

## 2014-02-04 VITALS — BP 138/81 | HR 59 | Temp 97.7°F | Ht 66.5 in | Wt 308.0 lb

## 2014-02-04 DIAGNOSIS — E118 Type 2 diabetes mellitus with unspecified complications: Secondary | ICD-10-CM

## 2014-02-04 DIAGNOSIS — E1165 Type 2 diabetes mellitus with hyperglycemia: Secondary | ICD-10-CM

## 2014-02-04 DIAGNOSIS — T887XXA Unspecified adverse effect of drug or medicament, initial encounter: Secondary | ICD-10-CM

## 2014-02-04 DIAGNOSIS — E11622 Type 2 diabetes mellitus with other skin ulcer: Secondary | ICD-10-CM

## 2014-02-04 DIAGNOSIS — E1169 Type 2 diabetes mellitus with other specified complication: Secondary | ICD-10-CM

## 2014-02-04 DIAGNOSIS — IMO0002 Reserved for concepts with insufficient information to code with codable children: Secondary | ICD-10-CM

## 2014-02-04 DIAGNOSIS — T50905A Adverse effect of unspecified drugs, medicaments and biological substances, initial encounter: Secondary | ICD-10-CM

## 2014-02-04 DIAGNOSIS — L97909 Non-pressure chronic ulcer of unspecified part of unspecified lower leg with unspecified severity: Secondary | ICD-10-CM

## 2014-02-04 NOTE — Assessment & Plan Note (Signed)
States her most recent A1C was 6. Greatly appreciate her PCP's f/u.

## 2014-02-04 NOTE — Progress Notes (Signed)
   Subjective:    Patient ID: [name], female    DOB: 08-10-1946, 68 y.o.   MRN: [id number]  HPI 68 yo F with DM2, morbid obesity (BMI 49), chronic LLE ulcer. Prev pseudomonas (R- aztreonam, cipro) She was admtted 5-3 to 5-7 with worsening of this ulcer. She was evaluated by [facility] and had I &D, placement of VAC on 5-6. She was d/c home with vanco/cefepime. By 5-13 she had developed a diffuse erythematous rash on her trunk. That has become pruritic, and peeling.  Feels like this is due to vanco. Had her infusion rate increased around this time as well. Had noted greenish d/c around her PIC site over the last 2 days.  Has been taking atarax, alternating with benadryl. Also taking tramadol, tylenol.  Was seen by PCP 5-15 and was given steroid blast. 40mg /day for 5 days, today to start 20mg /day for 5 days.  Was seen in ID 5-18 and taken of all anbx. Still has some rash. Is at 10mg /day of prednisone, still taking benadryl, pepcid, atarax. Feels like she is still getting more lesions.  No fever or chills. No dysphagia, no SOB. Has occas chills.  Saw PCP yesterday, had labs drawn.   Review of Systems See HPI    Objective:   Physical Exam  Constitutional: She appears well-developed and well-nourished.  HENT:  Mouth/Throat: No oropharyngeal exudate.  Eyes: EOM are normal. Pupils are equal, round, and reactive to light.  Neck: Neck supple.  Cardiovascular: Normal rate, regular rhythm and normal heart sounds.   Pulmonary/Chest: Effort normal and breath sounds normal.  Abdominal: Soft. Bowel sounds are normal. There is no tenderness.  Musculoskeletal:       Legs: Lymphadenopathy:    She has no cervical adenopathy.          Assessment & Plan:

## 2014-02-04 NOTE — Assessment & Plan Note (Signed)
Will have her seen by Derm. I am concerned that she is still developing new lesions. Will complete her prednisone/ benadryl/atarax/pepcid.

## 2014-02-04 NOTE — Assessment & Plan Note (Signed)
Will continue to hold her anbx at this point. She has f/u with Dr Migdalia Dk next week.

## 2014-02-09 ENCOUNTER — Telehealth: Payer: Self-pay | Admitting: *Deleted

## 2014-02-09 ENCOUNTER — Encounter (HOSPITAL_BASED_OUTPATIENT_CLINIC_OR_DEPARTMENT_OTHER): Payer: Medicare Other | Attending: Plastic Surgery

## 2014-02-09 DIAGNOSIS — L97809 Non-pressure chronic ulcer of other part of unspecified lower leg with unspecified severity: Secondary | ICD-10-CM | POA: Insufficient documentation

## 2014-02-09 DIAGNOSIS — E669 Obesity, unspecified: Secondary | ICD-10-CM | POA: Insufficient documentation

## 2014-02-09 DIAGNOSIS — I890 Lymphedema, not elsewhere classified: Secondary | ICD-10-CM | POA: Insufficient documentation

## 2014-02-09 DIAGNOSIS — I872 Venous insufficiency (chronic) (peripheral): Secondary | ICD-10-CM | POA: Insufficient documentation

## 2014-02-09 NOTE — Telephone Encounter (Signed)
Later ok thanks

## 2014-02-09 NOTE — Telephone Encounter (Signed)
Pt scheduled for 6/8, would like to come instead end of June/begining of July. Her drug reaction rash is starting to peel and fade.  Pt is continuing steroid taper, still taking benadryl and atarax but has stopped pepcid (doesn't like taking stomach medication).  Declined dermatology at this time.   6/1 - Wound care took wound vac off for 1 week. She states the vac was causing trauma to the surrounding tissue, will have it replaced 6/8. Pt states they are happy with how the wound is healing today.   Pt will be following up with her PCP regarding blood work end of June, would like to follow up here after that appointment rather than 6/8.  Please advise if 4 week f/u is appropriate rather than 2 week. Landis Gandy, RN

## 2014-02-09 NOTE — Progress Notes (Signed)
[facility]  [name], [name]               ACCOUNT NO.:  [id number]  MEDICAL RECORD NO.:  [id number]      DATE OF BIRTH:  [birth year]  PHYSICIAN:  Theodoro Kos, DO       VISIT DATE:  02/09/2014                                  OFFICE VISIT   The patient is a 68 year old female who is here for followup on her left leg ulcer, chronic venous insufficiency.  She underwent debridement in the hospital and treatment with ACell and the VAC.  She has a little bit of break down in the periwound area, but the wound is granulating, looks healthy.  No signs of overt infection.  There is no change in her medications or social history.  She is modifying her vancomycin because she did have a reaction to it with a rash all over.  We are going to hold off on the [facility] for this week and do collagen, alginate, and a Profore to help the periwound area, and we will see her back in a week.     Theodoro Kos, DO     CS/MEDQ  D:  02/09/2014  T:  02/09/2014  Job:  [id number]

## 2014-02-13 ENCOUNTER — Telehealth: Payer: Self-pay | Admitting: *Deleted

## 2014-02-13 ENCOUNTER — Encounter (HOSPITAL_COMMUNITY): Payer: Self-pay | Admitting: Emergency Medicine

## 2014-02-13 ENCOUNTER — Emergency Department (HOSPITAL_COMMUNITY): Payer: Medicare Other

## 2014-02-13 ENCOUNTER — Inpatient Hospital Stay (HOSPITAL_COMMUNITY)
Admission: EM | Admit: 2014-02-13 | Discharge: 2014-02-17 | DRG: 872 | Disposition: A | Discharge status: Home Health | Payer: Medicare Other | Attending: Internal Medicine | Admitting: Internal Medicine

## 2014-02-13 DIAGNOSIS — I4891 Unspecified atrial fibrillation: Secondary | ICD-10-CM

## 2014-02-13 DIAGNOSIS — N058 Unspecified nephritic syndrome with other morphologic changes: Secondary | ICD-10-CM | POA: Diagnosis present

## 2014-02-13 DIAGNOSIS — Z794 Long term (current) use of insulin: Secondary | ICD-10-CM

## 2014-02-13 DIAGNOSIS — R4182 Altered mental status, unspecified: Secondary | ICD-10-CM

## 2014-02-13 DIAGNOSIS — T50905A Adverse effect of unspecified drugs, medicaments and biological substances, initial encounter: Secondary | ICD-10-CM | POA: Diagnosis present

## 2014-02-13 DIAGNOSIS — I83029 Varicose veins of left lower extremity with ulcer of unspecified site: Secondary | ICD-10-CM

## 2014-02-13 DIAGNOSIS — I872 Venous insufficiency (chronic) (peripheral): Secondary | ICD-10-CM

## 2014-02-13 DIAGNOSIS — R778 Other specified abnormalities of plasma proteins: Secondary | ICD-10-CM | POA: Diagnosis present

## 2014-02-13 DIAGNOSIS — L97809 Non-pressure chronic ulcer of other part of unspecified lower leg with unspecified severity: Secondary | ICD-10-CM

## 2014-02-13 DIAGNOSIS — I10 Essential (primary) hypertension: Secondary | ICD-10-CM

## 2014-02-13 DIAGNOSIS — D649 Anemia, unspecified: Secondary | ICD-10-CM | POA: Diagnosis present

## 2014-02-13 DIAGNOSIS — E876 Hypokalemia: Secondary | ICD-10-CM | POA: Diagnosis present

## 2014-02-13 DIAGNOSIS — R001 Bradycardia, unspecified: Secondary | ICD-10-CM

## 2014-02-13 DIAGNOSIS — I480 Paroxysmal atrial fibrillation: Secondary | ICD-10-CM

## 2014-02-13 DIAGNOSIS — E1165 Type 2 diabetes mellitus with hyperglycemia: Secondary | ICD-10-CM | POA: Diagnosis present

## 2014-02-13 DIAGNOSIS — E1169 Type 2 diabetes mellitus with other specified complication: Secondary | ICD-10-CM

## 2014-02-13 DIAGNOSIS — IMO0002 Reserved for concepts with insufficient information to code with codable children: Secondary | ICD-10-CM | POA: Diagnosis present

## 2014-02-13 DIAGNOSIS — E11622 Type 2 diabetes mellitus with other skin ulcer: Secondary | ICD-10-CM | POA: Diagnosis present

## 2014-02-13 DIAGNOSIS — N183 Chronic kidney disease, stage 3 unspecified: Secondary | ICD-10-CM

## 2014-02-13 DIAGNOSIS — L97909 Non-pressure chronic ulcer of unspecified part of unspecified lower leg with unspecified severity: Secondary | ICD-10-CM | POA: Diagnosis present

## 2014-02-13 DIAGNOSIS — E1142 Type 2 diabetes mellitus with diabetic polyneuropathy: Secondary | ICD-10-CM | POA: Diagnosis present

## 2014-02-13 DIAGNOSIS — E118 Type 2 diabetes mellitus with unspecified complications: Secondary | ICD-10-CM

## 2014-02-13 DIAGNOSIS — R7989 Other specified abnormal findings of blood chemistry: Secondary | ICD-10-CM | POA: Diagnosis present

## 2014-02-13 DIAGNOSIS — E1129 Type 2 diabetes mellitus with other diabetic kidney complication: Secondary | ICD-10-CM | POA: Diagnosis present

## 2014-02-13 DIAGNOSIS — L270 Generalized skin eruption due to drugs and medicaments taken internally: Secondary | ICD-10-CM

## 2014-02-13 DIAGNOSIS — A419 Sepsis, unspecified organism: Principal | ICD-10-CM | POA: Diagnosis present

## 2014-02-13 DIAGNOSIS — L97929 Non-pressure chronic ulcer of unspecified part of left lower leg with unspecified severity: Secondary | ICD-10-CM

## 2014-02-13 DIAGNOSIS — N189 Chronic kidney disease, unspecified: Secondary | ICD-10-CM

## 2014-02-13 DIAGNOSIS — D72829 Elevated white blood cell count, unspecified: Secondary | ICD-10-CM

## 2014-02-13 DIAGNOSIS — I83009 Varicose veins of unspecified lower extremity with ulcer of unspecified site: Secondary | ICD-10-CM

## 2014-02-13 DIAGNOSIS — Z79899 Other long term (current) drug therapy: Secondary | ICD-10-CM

## 2014-02-13 DIAGNOSIS — R509 Fever, unspecified: Secondary | ICD-10-CM

## 2014-02-13 DIAGNOSIS — Z6841 Body Mass Index (BMI) 40.0 and over, adult: Secondary | ICD-10-CM

## 2014-02-13 DIAGNOSIS — N179 Acute kidney failure, unspecified: Secondary | ICD-10-CM | POA: Diagnosis present

## 2014-02-13 DIAGNOSIS — E1149 Type 2 diabetes mellitus with other diabetic neurological complication: Secondary | ICD-10-CM | POA: Diagnosis present

## 2014-02-13 LAB — CBC WITH DIFFERENTIAL/PLATELET
BASOS PCT: 0 % (ref 0–1)
Basophils Absolute: 0 10*3/uL (ref 0.0–0.1)
Eosinophils Absolute: 0.4 10*3/uL (ref 0.0–0.7)
Eosinophils Relative: 2 % (ref 0–5)
HEMATOCRIT: 36.4 % (ref 36.0–46.0)
Hemoglobin: 12 g/dL (ref 12.0–15.0)
LYMPHS ABS: 0.7 10*3/uL (ref 0.7–4.0)
LYMPHS PCT: 5 % — AB (ref 12–46)
MCH: 29.2 pg (ref 26.0–34.0)
MCHC: 33 g/dL (ref 30.0–36.0)
MCV: 88.6 fL (ref 78.0–100.0)
MONO ABS: 0.6 10*3/uL (ref 0.1–1.0)
MONOS PCT: 4 % (ref 3–12)
NEUTROS ABS: 14.3 10*3/uL — AB (ref 1.7–7.7)
Neutrophils Relative %: 89 % — ABNORMAL HIGH (ref 43–77)
Platelets: 206 10*3/uL (ref 150–400)
RBC: 4.11 MIL/uL (ref 3.87–5.11)
RDW: 16.3 % — ABNORMAL HIGH (ref 11.5–15.5)
WBC: 15.9 10*3/uL — ABNORMAL HIGH (ref 4.0–10.5)

## 2014-02-13 LAB — BASIC METABOLIC PANEL
BUN: 33 mg/dL — AB (ref 6–23)
CHLORIDE: 95 meq/L — AB (ref 96–112)
CO2: 25 meq/L (ref 19–32)
Calcium: 9.9 mg/dL (ref 8.4–10.5)
Creatinine, Ser: 1.74 mg/dL — ABNORMAL HIGH (ref 0.50–1.10)
GFR calc Af Amer: 33 mL/min — ABNORMAL LOW (ref >90)
GFR calc non Af Amer: 29 mL/min — ABNORMAL LOW (ref >90)
GLUCOSE: 71 mg/dL (ref 70–99)
POTASSIUM: 3.1 meq/L — AB (ref 3.7–5.3)
Sodium: 138 mEq/L (ref 137–147)

## 2014-02-13 LAB — HEPATIC FUNCTION PANEL
ALK PHOS: 102 U/L (ref 39–117)
ALT: 26 U/L (ref 0–35)
AST: 29 U/L (ref 0–37)
Albumin: 4.1 g/dL (ref 3.5–5.2)
BILIRUBIN DIRECT: 0.8 mg/dL — AB (ref 0.0–0.3)
BILIRUBIN INDIRECT: 1.3 mg/dL — AB (ref 0.3–0.9)
Total Bilirubin: 2.1 mg/dL — ABNORMAL HIGH (ref 0.3–1.2)
Total Protein: 8.5 g/dL — ABNORMAL HIGH (ref 6.0–8.3)

## 2014-02-13 LAB — URINALYSIS, ROUTINE W REFLEX MICROSCOPIC
Bilirubin Urine: NEGATIVE
Glucose, UA: NEGATIVE mg/dL
Hgb urine dipstick: NEGATIVE
Ketones, ur: NEGATIVE mg/dL
Leukocytes, UA: NEGATIVE
NITRITE: NEGATIVE
PH: 5 (ref 5.0–8.0)
Protein, ur: NEGATIVE mg/dL
SPECIFIC GRAVITY, URINE: 1.014 (ref 1.005–1.030)
Urobilinogen, UA: 0.2 mg/dL (ref 0.0–1.0)

## 2014-02-13 LAB — TROPONIN I

## 2014-02-13 LAB — GLUCOSE, CAPILLARY: Glucose-Capillary: 349 mg/dL — ABNORMAL HIGH (ref 70–99)

## 2014-02-13 LAB — CBG MONITORING, ED: Glucose-Capillary: 148 mg/dL — ABNORMAL HIGH (ref 70–99)

## 2014-02-13 LAB — TSH: TSH: 2.22 u[IU]/mL (ref 0.350–4.500)

## 2014-02-13 LAB — HEMOGLOBIN A1C
HEMOGLOBIN A1C: 6.3 % — AB (ref <5.7)
MEAN PLASMA GLUCOSE: 134 mg/dL — AB (ref <117)

## 2014-02-13 LAB — MRSA PCR SCREENING: MRSA by PCR: NEGATIVE

## 2014-02-13 LAB — I-STAT CG4 LACTIC ACID, ED: Lactic Acid, Venous: 1.92 mmol/L (ref 0.5–2.2)

## 2014-02-13 MED ORDER — METOPROLOL TARTRATE 1 MG/ML IV SOLN: 5.0000 mg | INTRAVENOUS | Status: DC | PRN

## 2014-02-13 MED ORDER — HYDROXYZINE HCL 50 MG PO TABS
50.0000 mg | ORAL_TABLET | Freq: Three times a day (TID) | ORAL | Status: DC | PRN
  Administered 2014-02-13 – 2014-02-16 (×7): 50 mg via ORAL
  Filled 2014-02-13 (×5): qty 1
  Filled 2014-02-13: qty 2
  Filled 2014-02-13 (×2): qty 1

## 2014-02-13 MED ORDER — SODIUM CHLORIDE 0.9 % IJ SOLN
3.0000 mL | Freq: Two times a day (BID) | INTRAMUSCULAR | Status: DC
  Administered 2014-02-13 – 2014-02-17 (×6): 3 mL via INTRAVENOUS

## 2014-02-13 MED ORDER — PIPERACILLIN-TAZOBACTAM 3.375 G IVPB
3.3750 g | Freq: Three times a day (TID) | INTRAVENOUS | Status: DC
  Administered 2014-02-13 – 2014-02-15 (×7): 3.375 g via INTRAVENOUS
  Filled 2014-02-13 (×11): qty 50

## 2014-02-13 MED ORDER — POTASSIUM CHLORIDE CRYS ER 20 MEQ PO TBCR
40.0000 meq | EXTENDED_RELEASE_TABLET | Freq: Once | ORAL | Status: AC
  Administered 2014-02-13: 40 meq via ORAL
  Filled 2014-02-13: qty 2

## 2014-02-13 MED ORDER — INSULIN ASPART 100 UNIT/ML ~~LOC~~ SOLN
0.0000 [IU] | Freq: Every day | SUBCUTANEOUS | Status: DC
  Administered 2014-02-13: 3 [IU] via SUBCUTANEOUS

## 2014-02-13 MED ORDER — POLYSACCHARIDE IRON COMPLEX 150 MG PO CAPS
150.0000 mg | ORAL_CAPSULE | Freq: Two times a day (BID) | ORAL | Status: DC
  Administered 2014-02-13 – 2014-02-17 (×9): 150 mg via ORAL
  Filled 2014-02-13 (×11): qty 1

## 2014-02-13 MED ORDER — HEPARIN SODIUM (PORCINE) 5000 UNIT/ML IJ SOLN
5000.0000 [IU] | Freq: Three times a day (TID) | INTRAMUSCULAR | Status: DC
  Administered 2014-02-13 – 2014-02-17 (×13): 5000 [IU] via SUBCUTANEOUS
  Filled 2014-02-13 (×15): qty 1

## 2014-02-13 MED ORDER — FAMOTIDINE 40 MG PO TABS
40.0000 mg | ORAL_TABLET | Freq: Every day | ORAL | Status: DC
  Administered 2014-02-13 – 2014-02-14 (×2): 40 mg via ORAL
  Filled 2014-02-13 (×3): qty 1

## 2014-02-13 MED ORDER — ONDANSETRON HCL 4 MG/2ML IJ SOLN: 4.0000 mg | Freq: Four times a day (QID) | INTRAMUSCULAR | Status: DC | PRN

## 2014-02-13 MED ORDER — INSULIN ASPART PROT & ASPART (70-30 MIX) 100 UNIT/ML ~~LOC~~ SUSP
50.0000 [IU] | Freq: Two times a day (BID) | SUBCUTANEOUS | Status: DC
  Administered 2014-02-13: 50 [IU] via SUBCUTANEOUS
  Filled 2014-02-13: qty 10

## 2014-02-13 MED ORDER — PIPERACILLIN-TAZOBACTAM 3.375 G IVPB 30 MIN
3.3750 g | Freq: Once | INTRAVENOUS | Status: AC
  Administered 2014-02-13: 3.375 g via INTRAVENOUS
  Filled 2014-02-13: qty 50

## 2014-02-13 MED ORDER — TRAMADOL HCL 50 MG PO TABS
50.0000 mg | ORAL_TABLET | Freq: Four times a day (QID) | ORAL | Status: DC | PRN
  Administered 2014-02-13 – 2014-02-14 (×4): 50 mg via ORAL
  Filled 2014-02-13 (×4): qty 1

## 2014-02-13 MED ORDER — ACETAMINOPHEN 325 MG PO TABS
650.0000 mg | ORAL_TABLET | Freq: Once | ORAL | Status: AC
  Administered 2014-02-13: 650 mg via ORAL
  Filled 2014-02-13: qty 2

## 2014-02-13 MED ORDER — INSULIN ASPART 100 UNIT/ML ~~LOC~~ SOLN
0.0000 [IU] | Freq: Three times a day (TID) | SUBCUTANEOUS | Status: DC
  Administered 2014-02-13: 11 [IU] via SUBCUTANEOUS
  Administered 2014-02-14: 3 [IU] via SUBCUTANEOUS
  Administered 2014-02-14: 5 [IU] via SUBCUTANEOUS
  Administered 2014-02-14: 8 [IU] via SUBCUTANEOUS
  Administered 2014-02-15: 3 [IU] via SUBCUTANEOUS
  Administered 2014-02-16: 2 [IU] via SUBCUTANEOUS
  Administered 2014-02-16 – 2014-02-17 (×4): 3 [IU] via SUBCUTANEOUS

## 2014-02-13 MED ORDER — POTASSIUM CHLORIDE 10 MEQ/100ML IV SOLN: 10.0000 meq | Freq: Once | INTRAVENOUS | Status: DC

## 2014-02-13 MED ORDER — POTASSIUM CHLORIDE 10 MEQ/100ML IV SOLN
10.0000 meq | INTRAVENOUS | Status: DC
  Administered 2014-02-13: 10 meq via INTRAVENOUS
  Filled 2014-02-13: qty 100

## 2014-02-13 MED ORDER — SODIUM CHLORIDE 0.9 % IV SOLN: INTRAVENOUS | Status: DC

## 2014-02-13 MED ORDER — POTASSIUM CHLORIDE CRYS ER 10 MEQ PO TBCR
10.0000 meq | EXTENDED_RELEASE_TABLET | Freq: Two times a day (BID) | ORAL | Status: DC
  Administered 2014-02-13 – 2014-02-17 (×9): 10 meq via ORAL
  Filled 2014-02-13 (×10): qty 1

## 2014-02-13 MED ORDER — ONDANSETRON HCL 4 MG PO TABS: 4.0000 mg | ORAL_TABLET | Freq: Four times a day (QID) | ORAL | Status: DC | PRN

## 2014-02-13 NOTE — ED Notes (Signed)
Reported temperature of 100.3 to Dr. Sharol Given, and patient request for drink.  Dr. Sharol Given orders Tylenol for fever, and allows patient to drink at this time.

## 2014-02-13 NOTE — Progress Notes (Signed)
ANTIBIOTIC CONSULT NOTE - INITIAL  Pharmacy Consult for zosyn Indication: wound infection  Allergies  Allergen Reactions  . Bactrim [Sulfamethoxazole-Tmp Ds] Shortness Of Breath    Depression based reactions, altered mentality  . Atorvastatin Other (See Comments)    aches  . Sulfamethoxazole-Trimethoprim Swelling  . Vancomycin     Rash, swelling , shortness of breath .  Marland Kitchen Actos [Pioglitazone Hydrochloride] Other (See Comments)    unknown  . Celebrex [Celecoxib] Other (See Comments) and Rash    unknown  . Ibuprofen Other (See Comments) and Rash  . Latex Rash  . Neosporin [Neomycin-Bacitracin Zn-Polymyx] Rash  . Pioglitazone Rash    Patient Measurements:   Adjusted Body Weight:   Vital Signs: Temp: 98.7 F (37.1 C) (06/05 0718) Temp src: Oral (06/05 0718) BP: 125/63 mmHg (06/05 1120) Pulse Rate: 82 (06/05 1120) Intake/Output from previous day:   Intake/Output from this shift:    Labs:  Recent Labs  02/13/14 0335  WBC 15.9*  HGB 12.0  PLT 206  CREATININE 1.74*   The CrCl is unknown because both a height and weight (above a minimum accepted value) are required for this calculation. No results found for this basename: VANCOTROUGH, Corlis Leak, VANCORANDOM, [place], GENTPEAK, GENTRANDOM, TOBRATROUGH, TOBRAPEAK, TOBRARND, AMIKACINPEAK, AMIKACINTROU, AMIKACIN,  in the last 72 hours   Microbiology: Recent Results (from the past 720 hour(s))  CLOSTRIDIUM DIFFICILE BY PCR     Status: None   Collection Time    01/15/14  8:23 PM      Result Value Ref Range Status   C difficile by pcr NEGATIVE  NEGATIVE Final    Medical History: Past Medical History  Diagnosis Date  . Venous insufficiency   . Venous stasis ulcer   . Diabetes mellitus due to underlying condition with diabetic nephropathy   . Neuropathy   . DJD (degenerative joint disease)   . Anemia   . Nephropathy, diabetic     Medications:  Scheduled:  . famotidine  40 mg Oral Daily  . heparin  5,000  Units Subcutaneous 3 times per day  . insulin aspart  0-15 Units Subcutaneous TID WC  . insulin aspart  0-5 Units Subcutaneous QHS  . insulin aspart protamine- aspart  50 Units Subcutaneous BID WC  . iron polysaccharides  150 mg Oral BID  . potassium chloride  10 mEq Oral BID  . sodium chloride  3 mL Intravenous Q12H   Infusions:  . sodium chloride     Assessment: 68 yo who was admitted for fevers and AMS. She has a hx of pseudomonas wound infection per note. Her wound vac was recently removed. Looks like she has gotten zosyn before. She has a lot of allergy issues.   Plan:   Zosyn 3.375g IV q8

## 2014-02-13 NOTE — Progress Notes (Signed)
Follow up from [facility] Subjective: Patient admitted with fever, chills and confusion.  Left lower leg ulcer is healing slowly. Patient had VAC dressing in place until earlier this week.  Collagen dressing currently over main ulcer and over small ulcer on the posterior calf. There is some greenish drainage noted. Several small skin tears noted as well.  Pt had Profore dressing on until admit.  The areas were dressed with Allevyn and telfa to protect skin as pt reports breakdown with kerlix directly against skin surface. Kerlix wrap and then Ace wrap was applied to help control edema.   Work up for fever underway. Patient reports she is feeling much better and now afebrile. Currently on Zosyn. She has leukocytosis with some left shift, but has also had recent steroids due to allergic reaction to Vancomycin. She reports that her rash is much improved.   Objective: Vital signs in last 24 hours: Temp:  [98.4 F (36.9 C)-100.3 F (37.9 C)] 98.4 F (36.9 C) (06/05 1600) Pulse Rate:  [59-126] 59 (06/05 1541) Resp:  [15-28] 22 (06/05 1541) BP: (97-158)/(41-99) 123/45 mmHg (06/05 1541) SpO2:  [91 %-100 %] 100 % (06/05 1541)    Intake/Output from previous day:   Intake/Output this shift:     Lab Results:   Recent Labs  02/13/14 0335  WBC 15.9*  HGB 12.0  HCT 36.4  PLT 206   BMET  Recent Labs  02/13/14 0335  NA 138  K 3.1*  CL 95*  CO2 25  GLUCOSE 71  BUN 33*  CREATININE 1.74*  CALCIUM 9.9   PT/INR No results found for this basename: LABPROT, INR,  in the last 72 hours ABG No results found for this basename: PHART, PCO2, PO2, HCO3,  in the last 72 hours  Studies/Results: Dg Chest Port 1 View  02/13/2014   CLINICAL DATA:  fever  EXAM: PORTABLE CHEST - 1 VIEW  COMPARISON:  Prior radiograph from 02/11/2014  FINDINGS: Right-sided thyroid line has been removed. Cardiomegaly is stable. Mediastinal silhouette within normal limits.  Lungs are normally inflated.  There is diffuse peribronchial thickening, which may reflect active bronchiolitis given the history of fever. No consolidative airspace disease. No pulmonary edema or pleural effusion. No pneumothorax.  Osseous structures are unchanged with no acute osseous abnormality identified.  IMPRESSION: 1. Diffuse peribronchial thickening, which may reflect bronchiolitis in the setting of fever. No consolidative airspace disease identified. 2. Stable cardiomegaly without pulmonary edema.   Electronically Signed   By: Jeannine Boga M.D.   On: 02/13/2014 06:38    Anti-infectives: Anti-infectives   Start     Dose/Rate Route Frequency Ordered Stop   02/13/14 1400  piperacillin-tazobactam (ZOSYN) IVPB 3.375 g     3.375 g 12.5 mL/hr over 240 Minutes Intravenous Every 8 hours 02/13/14 1341     02/13/14 0700  piperacillin-tazobactam (ZOSYN) IVPB 3.375 g     3.375 g 100 mL/hr over 30 Minutes Intravenous  Once 02/13/14 0650 02/13/14 0813      Assessment/Plan: S/P Irrigation and debridement of chronic ulceration of left lower leg- Will order dressings while hospitalized. Will plan to see in [facility] on Monday as scheduled if discharged over the weekend.  Will see again in hospital on Monday if still admitted and may re-apply VAC at that time and possibly more Acell as well. Not certain that ulcer is source of fever and leukocytosis as it appears stable.    LOS: 0 days    RAYBURN,SHAWN,PA-C Plastic Surgery [phone]

## 2014-02-13 NOTE — Consult Note (Signed)
CONSULTATION NOTE  Reason for Consult: A-fib  Requesting Physician: Dr. Sharol Given  Cardiologist: Dr. Meda Coffee  HPI: This is a 68 y.o. female with a past medical history significant for IDDM with neuropathy and vasculopathy, venous stasis ulcers with chronic infection, anemia, morbid obesity and family history of heart disease in both parents.  She presents with fever, shaking chills and confusion.  She apparently had nausea and vomiting after dinner the other night. She was recently admitted for pseudomonal infection - seen in consultation by Dr. Meda Coffee for new onset atrial fibrillation in early May of 2015. She had an echocardiogram on 01/16/2014 for PAF which showed EF 55-60%, restrictive diastolic physiology, moderate LAE, mild RAE and high PA pressure of 58 mmHg. On presentation to the ER during this hospitalization, she was found again to be in a-fib with RVR in the 110-120 range - apps. She was not placed on anticoagulation due to chronic anemia and concern for slow bleeding. She was also taken off aspirin.  Labs now indicate a leukocytosis with left shift.  Cardiology is asked to consult on management of a-fib.  PMHx:  Past Medical History  Diagnosis Date  . Venous insufficiency   . Venous stasis ulcer   . Diabetes mellitus due to underlying condition with diabetic nephropathy   . Neuropathy   . DJD (degenerative joint disease)   . Anemia   . Nephropathy, diabetic    Past Surgical History  Procedure Laterality Date  . Tonsilectomy, adenoidectomy, bilateral myringotomy and tubes    . Mouth surgery    . I&d extremity Left 01/14/2014    Procedure: IRRIGATION AND DEBRIDEMENT LEFT LOWER EXTREMITY POSSIBLE ACELL AND POSSIBLE VAC PLACEMENT;  Surgeon: Theodoro Kos, DO;  Location: [facility];  Service: Plastics;  Laterality: Left;    FAMHx: Family History  Problem Relation Age of Onset  . Heart disease Mother   . Heart disease Father   . Diabetes Maternal Grandmother     SOCHx:  reports that she has never smoked. She has never used smokeless tobacco. She reports that she does not drink alcohol or use illicit drugs.  ALLERGIES: Allergies  Allergen Reactions  . Bactrim [Sulfamethoxazole-Tmp Ds] Shortness Of Breath    Depression based reactions, altered mentality  . Atorvastatin Other (See Comments)    aches  . Sulfamethoxazole-Trimethoprim Swelling  . Vancomycin     Rash, swelling , shortness of breath .  Marland Kitchen Actos [Pioglitazone Hydrochloride] Other (See Comments)    unknown  . Celebrex [Celecoxib] Other (See Comments) and Rash    unknown  . Ibuprofen Other (See Comments) and Rash  . Latex Rash  . Neosporin [Neomycin-Bacitracin Zn-Polymyx] Rash  . Pioglitazone Rash    ROS: A comprehensive review of systems was negative except for: Constitutional: positive for chills, fatigue and fevers Cardiovascular: positive for lower extremity edema and palpitations Gastrointestinal: positive for nausea and vomiting Integument/breast: positive for rash, skin color change, skin lesion(s) and chronic stasis ulcer Neurological: positive for confusion  HOME MEDICATIONS:   Medication List    ASK your doctor about these medications       bumetanide 2 MG tablet  Commonly known as:  BUMEX  Take 2 mg by mouth daily.     COD LIVER OIL PO  Take 1,000 mg by mouth daily.     famotidine 40 MG tablet  Commonly known as:  PEPCID  Take 1 tablet (40 mg total) by mouth daily.     glipiZIDE 10 MG tablet  Commonly  known as:  GLUCOTROL  Take 10 mg by mouth daily before breakfast.     hydrochlorothiazide 25 MG tablet  Commonly known as:  HYDRODIURIL  Take 25 mg by mouth daily.     hydrOXYzine 50 MG tablet  Commonly known as:  ATARAX/VISTARIL  Take 50 mg by mouth 3 (three) times daily as needed for itching.     insulin NPH-regular Human (70-30) 100 UNIT/ML injection  Commonly known as:  NOVOLIN 70/30  Inject 80 Units into the skin 2 (two) times daily with a meal.      insulin regular 100 units/mL injection  Commonly known as:  NOVOLIN R,HUMULIN R  Inject 0-20 Units into the skin daily as needed for high blood sugar. Sliding scale     iron polysaccharides 150 MG capsule  Commonly known as:  NIFEREX  Take 150 mg by mouth 2 (two) times daily.     mupirocin ointment 2 %  Commonly known as:  BACTROBAN  Apply 1 application topically daily.     OVER THE COUNTER MEDICATION  Take 1 tablet by mouth 2 (two) times daily. Clorellea     potassium chloride 10 MEQ tablet  Commonly known as:  K-DUR,KLOR-CON  Take 10 mEq by mouth 2 (two) times daily.     predniSONE 10 MG tablet  Commonly known as:  STERAPRED UNI-PAK  Take by mouth daily. 39m po daily for 4 days, 258mpo daily for 4 days, 1039mo daily for 4 days, 5mg22m daily for 4 days.     timolol 0.5 % ophthalmic solution  Commonly known as:  TIMOPTIC  Place 1 drop into both eyes daily.     traMADol 50 MG tablet  Commonly known as:  ULTRAM  Take 50 mg by mouth every 6 (six) hours as needed for moderate pain.     vitamin C 500 MG tablet  Commonly known as:  ASCORBIC ACID  Take 500 mg by mouth 2 (two) times daily.       HOSPITAL MEDICATIONS: I have reviewed the patient's current medications.  VITALS: Blood pressure 97/48, pulse 109, temperature 98.7 F (37.1 C), temperature source Oral, resp. rate 26, SpO2 99.00%.  PHYSICAL EXAM: General appearance: alert, no distress and morbidly obese Neck: no carotid bruit and no JVD Lungs: clear to auscultation bilaterally Heart: irregularly irregular rhythm Abdomen: morbidly obese, soft, non-tender Extremities: edema 2+ bilaterally, varicose veins noted, venous stasis dermatitis noted and chronic venous stasis ulcer with erythema and warmth over the left medial malleolus/anterior shin Pulses: 2+ and symmetric Skin: warm, pale, salmon/pink rash over chest, arms and back Neurologic: Mental status: Somewhat sleepy, able to converse, follow commands, no gross  focal defecits Psych: Mood, affect normal  LABS: Results for orders placed during the hospital encounter of 02/13/14 (from the past 48 hour(s))  HEPATIC FUNCTION PANEL     Status: Abnormal   Collection Time    02/13/14  3:33 AM      Result Value Ref Range   Total Protein 8.5 (*) 6.0 - 8.3 g/dL   Albumin 4.1  3.5 - 5.2 g/dL   AST 29  0 - 37 U/L   ALT 26  0 - 35 U/L   Alkaline Phosphatase 102  39 - 117 U/L   Total Bilirubin 2.1 (*) 0.3 - 1.2 mg/dL   Bilirubin, Direct 0.8 (*) 0.0 - 0.3 mg/dL   Indirect Bilirubin 1.3 (*) 0.3 - 0.9 mg/dL  CBC WITH DIFFERENTIAL     Status: Abnormal  Collection Time    02/13/14  3:35 AM      Result Value Ref Range   WBC 15.9 (*) 4.0 - 10.5 K/uL   RBC 4.11  3.87 - 5.11 MIL/uL   Hemoglobin 12.0  12.0 - 15.0 g/dL   HCT 36.4  36.0 - 46.0 %   MCV 88.6  78.0 - 100.0 fL   MCH 29.2  26.0 - 34.0 pg   MCHC 33.0  30.0 - 36.0 g/dL   RDW 16.3 (*) 11.5 - 15.5 %   Platelets 206  150 - 400 K/uL   Neutrophils Relative % 89 (*) 43 - 77 %   Neutro Abs 14.3 (*) 1.7 - 7.7 K/uL   Lymphocytes Relative 5 (*) 12 - 46 %   Lymphs Abs 0.7  0.7 - 4.0 K/uL   Monocytes Relative 4  3 - 12 %   Monocytes Absolute 0.6  0.1 - 1.0 K/uL   Eosinophils Relative 2  0 - 5 %   Eosinophils Absolute 0.4  0.0 - 0.7 K/uL   Basophils Relative 0  0 - 1 %   Basophils Absolute 0.0  0.0 - 0.1 K/uL  BASIC METABOLIC PANEL     Status: Abnormal   Collection Time    02/13/14  3:35 AM      Result Value Ref Range   Sodium 138  137 - 147 mEq/L   Potassium 3.1 (*) 3.7 - 5.3 mEq/L   Chloride 95 (*) 96 - 112 mEq/L   CO2 25  19 - 32 mEq/L   Glucose, Bld 71  70 - 99 mg/dL   BUN 33 (*) 6 - 23 mg/dL   Creatinine, Ser 1.74 (*) 0.50 - 1.10 mg/dL   Calcium 9.9  8.4 - 10.5 mg/dL   GFR calc non Af Amer 29 (*) >90 mL/min   GFR calc Af Amer 33 (*) >90 mL/min   Comment: (NOTE)     The eGFR has been calculated using the CKD EPI equation.     This calculation has not been validated in all clinical situations.      eGFR's persistently <90 mL/min signify possible Chronic Kidney     Disease.  I-STAT CG4 LACTIC ACID, ED     Status: None   Collection Time    02/13/14  3:49 AM      Result Value Ref Range   Lactic Acid, Venous 1.92  0.5 - 2.2 mmol/L  URINALYSIS, ROUTINE W REFLEX MICROSCOPIC     Status: None   Collection Time    02/13/14  4:50 AM      Result Value Ref Range   Color, Urine YELLOW  YELLOW   APPearance CLEAR  CLEAR   Specific Gravity, Urine 1.014  1.005 - 1.030   pH 5.0  5.0 - 8.0   Glucose, UA NEGATIVE  NEGATIVE mg/dL   Hgb urine dipstick NEGATIVE  NEGATIVE   Bilirubin Urine NEGATIVE  NEGATIVE   Ketones, ur NEGATIVE  NEGATIVE mg/dL   Protein, ur NEGATIVE  NEGATIVE mg/dL   Urobilinogen, UA 0.2  0.0 - 1.0 mg/dL   Nitrite NEGATIVE  NEGATIVE   Leukocytes, UA NEGATIVE  NEGATIVE   Comment: MICROSCOPIC NOT DONE ON URINES WITH NEGATIVE PROTEIN, BLOOD, LEUKOCYTES, NITRITE, OR GLUCOSE <1000 mg/dL.    IMAGING: Dg Chest Port 1 View  02/13/2014   CLINICAL DATA:  fever  EXAM: PORTABLE CHEST - 1 VIEW  COMPARISON:  Prior radiograph from 02/11/2014  FINDINGS: Right-sided thyroid line has been removed.  Cardiomegaly is stable. Mediastinal silhouette within normal limits.  Lungs are normally inflated. There is diffuse peribronchial thickening, which may reflect active bronchiolitis given the history of fever. No consolidative airspace disease. No pulmonary edema or pleural effusion. No pneumothorax.  Osseous structures are unchanged with no acute osseous abnormality identified.  IMPRESSION: 1. Diffuse peribronchial thickening, which may reflect bronchiolitis in the setting of fever. No consolidative airspace disease identified. 2. Stable cardiomegaly without pulmonary edema.   Electronically Signed   By: Jeannine Boga M.D.   On: 02/13/2014 06:38    HOSPITAL DIAGNOSES: Active Problems:   Type II or unspecified type diabetes mellitus with unspecified complication, uncontrolled   Sepsis   CKD  (chronic kidney disease), stage III   Hypokalemia   Diabetic leg ulcer   Morbid obesity- BMI 49   Adverse drug reaction   Fever   IMPRESSION: 1. Paroxysmal atrial fibrillation, rate-controlled 2. CHADSVASC score of 3 3. History of anemia - H/H now normal  RECOMMENDATION: Mrs. Mahr is back in a-fib which is somewhat rate-controlled. She is not on any negative chronotropes. Would suggest low dose b-blocker. The a-fib was likely precipitated by fever and infection, although she may have undiagnosed OSA or other factors contributing. Her echo findings preclude her to a-fib given bi-atrial enlargement. Anticoagulation long-term would be recommended, provided there is no clear source of her prior anemia that is identified. She has been on iron and is not anemic today - I do not see that she was transfused in May. It is likely she will convert back to sinus after responding to antibiotics - however if she does not, I would defer a cardioversion attempt until after 1 month of compliance on anticoagulation.  We will be happy to follow with you in the hospital. Thanks for the consult.  Time Spent Directly with Patient: 45 minutes  Pixie Casino, MD, [facility] Attending Cardiologist [place] 02/13/2014, 9:04 AM

## 2014-02-13 NOTE — ED Provider Notes (Addendum)
CSN: [id number]     Arrival date & time 02/13/14  0305 History   First MD Initiated Contact with Patient 02/13/14 [phone]     Chief Complaint  Patient presents with  . Fatigue  . Fever     (Consider location/radiation/quality/duration/timing/severity/associated sxs/prior Treatment) HPI 68 year old female presents to emergency department from home via EMS with complaint of fever, shaking chills, confusion.  Symptoms are similar to infection she had last month prior to debridement of any ulcer on her left leg.  Patient has been doing well at home.  Wound VAC was removed on Monday to let the area rest.  She had area are changed today.  Patient developed red man syndrome to vancomycin, and had since been stopped the same time due to rash.  She's not currently on any antibiotics.  Family denies any cough.  No headache.  She had some nausea and vomiting after dinner tonight.  She denies any abdominal pain.  No dysuria.  No other source of infection noted. Past Medical History  Diagnosis Date  . Venous insufficiency   . Venous stasis ulcer   . Diabetes mellitus due to underlying condition with diabetic nephropathy   . Neuropathy   . DJD (degenerative joint disease)   . Anemia   . Nephropathy, diabetic    Past Surgical History  Procedure Laterality Date  . Tonsilectomy, adenoidectomy, bilateral myringotomy and tubes    . Mouth surgery    . I&d extremity Left 01/14/2014    Procedure: IRRIGATION AND DEBRIDEMENT LEFT LOWER EXTREMITY POSSIBLE ACELL AND POSSIBLE VAC PLACEMENT;  Surgeon: Theodoro Kos, DO;  Location: [place];  Service: Plastics;  Laterality: Left;   Family History  Problem Relation Age of Onset  . Heart disease Mother   . Heart disease Father   . Diabetes Maternal Grandmother    History  Substance Use Topics  . Smoking status: Never Smoker   . Smokeless tobacco: Never Used  . Alcohol Use: No   OB History   Grav Para Term Preterm Abortions TAB SAB Ect Mult Living                  Review of Systems  Unable to perform ROS: Mental status change      Allergies  Bactrim; Atorvastatin; Sulfamethoxazole-trimethoprim; Vancomycin; Actos; Celebrex; Ibuprofen; Latex; Neosporin; and Pioglitazone  Home Medications   Prior to Admission medications   Medication Sig Start Date End Date Taking? Authorizing Provider  bumetanide (BUMEX) 2 MG tablet Take 2 mg by mouth daily.    Yes Historical Provider, MD  COD LIVER OIL PO Take 1,000 mg by mouth daily.     Yes Historical Provider, MD  famotidine (PEPCID) 40 MG tablet Take 1 tablet (40 mg total) by mouth daily. 01/26/14  Yes Campbell Riches, MD  glipiZIDE (GLUCOTROL) 10 MG tablet Take 10 mg by mouth daily before breakfast.    Yes Historical Provider, MD  hydrochlorothiazide 25 MG tablet Take 25 mg by mouth daily.    Yes Historical Provider, MD  hydrOXYzine (ATARAX/VISTARIL) 50 MG tablet Take 50 mg by mouth 3 (three) times daily as needed for itching.    Yes Historical Provider, MD  insulin NPH-insulin regular (NOVOLIN 70/30) (70-30) 100 UNIT/ML injection Inject 80 Units into the skin 2 (two) times daily with a meal.    Yes Historical Provider, MD  insulin regular (NOVOLIN R,HUMULIN R) 100 units/mL injection Inject 0-20 Units into the skin daily as needed for high blood sugar. Sliding scale  Yes Historical Provider, MD  iron polysaccharides (NIFEREX) 150 MG capsule Take 150 mg by mouth 2 (two) times daily.    Yes Historical Provider, MD  mupirocin ointment (BACTROBAN) 2 % Apply 1 application topically daily. 11/14/13  Yes Historical Provider, MD  OVER THE COUNTER MEDICATION Take 1 tablet by mouth 2 (two) times daily. Clorellea   Yes Historical Provider, MD  potassium chloride (K-DUR,KLOR-CON) 10 MEQ tablet Take 10 mEq by mouth 2 (two) times daily.   Yes Historical Provider, MD  timolol (TIMOPTIC) 0.5 % ophthalmic solution Place 1 drop into both eyes daily. 10/16/13  Yes Historical Provider, MD  traMADol (ULTRAM) 50 MG tablet Take 50 mg  by mouth every 6 (six) hours as needed for moderate pain.    Yes Historical Provider, MD  vitamin C (ASCORBIC ACID) 500 MG tablet Take 500 mg by mouth 2 (two) times daily.    Yes Historical Provider, MD  predniSONE (STERAPRED UNI-PAK) 10 MG tablet Take by mouth daily. 30mg  po daily for 4 days, 20mg  po daily for 4 days, 10mg  po daily for 4 days, 5mg  po daily for 4 days. 01/26/14   Campbell Riches, MD   BP 135/52  Pulse 83  Temp(Src) 100.3 F (37.9 C) (Oral)  Resp 28  SpO2 98% Physical Exam  Nursing note and vitals reviewed. Constitutional: She appears well-developed and well-nourished.  HENT:  Head: Normocephalic and atraumatic.  Right Ear: External ear normal.  Left Ear: External ear normal.  Nose: Nose normal.  Mouth/Throat: Oropharynx is clear and moist.  Eyes: Conjunctivae and EOM are normal. Pupils are equal, round, and reactive to light.  Neck: Normal range of motion. Neck supple. No JVD present. No tracheal deviation present. No thyromegaly present.  Cardiovascular: Normal rate, regular rhythm, normal heart sounds and intact distal pulses.  Exam reveals no gallop and no friction rub.   No murmur heard. Pulmonary/Chest: Effort normal and breath sounds normal. No stridor. No respiratory distress. She has no wheezes. She has no rales. She exhibits no tenderness.  Abdominal: Soft. Bowel sounds are normal. She exhibits no distension and no mass. There is no tenderness. There is no rebound and no guarding.  Musculoskeletal: Normal range of motion. She exhibits no edema and no tenderness.  Patient has 4 x 8 irregular ulcer to left lower lateral leg.  Wound has good granulation tissue in the base.  There are a few spots of fibrinous tissue.  There is no surrounding erythema warmth or crepitus.  Lymphadenopathy:    She has no cervical adenopathy.  Neurological: She is alert. She has normal reflexes. No cranial nerve deficit. She exhibits normal muscle tone. Coordination normal.  Patient  is awake, but confused.  She drifts off during answering of questions.  She is oriented.  Skin: Skin is warm and dry. No rash noted. There is erythema (patient has warmth and erythema to right leg greater than left). No pallor.  Psychiatric: She has a normal mood and affect. Her behavior is normal. Judgment and thought content normal.    ED Course  Procedures (including critical care time) Labs Review Labs Reviewed  CBC WITH DIFFERENTIAL - Abnormal; Notable for the following:    WBC 15.9 (*)    RDW 16.3 (*)    Neutrophils Relative % 89 (*)    Neutro Abs 14.3 (*)    Lymphocytes Relative 5 (*)    All other components within normal limits  BASIC METABOLIC PANEL - Abnormal; Notable for the following:  Potassium 3.1 (*)    Chloride 95 (*)    BUN 33 (*)    Creatinine, Ser 1.74 (*)    GFR calc non Af Amer 29 (*)    GFR calc Af Amer 33 (*)    All other components within normal limits  CULTURE, BLOOD (ROUTINE X 2)  CULTURE, BLOOD (ROUTINE X 2)  URINE CULTURE  URINALYSIS, ROUTINE W REFLEX MICROSCOPIC  HEPATIC FUNCTION PANEL  I-STAT CG4 LACTIC ACID, ED    Imaging Review No results found.   EKG Interpretation None      MDM   Final diagnoses:  Fever  Altered mental status  Diabetic leg ulcer    68 year old female with known diabetic ulcer to left lower leg, now with fever and confusion with chills. Labs show white blood cell count, patient recently completed a steroid Dosepak for allergic reaction to antibiotics.  No other source of infection noted.  Will discuss with hospitalist for admission and consult ID for appropriate antibiotic treatment.    Kalman Drape, MD 02/13/14 0654  8:19 AM Pt noted to develop afib just after discussion with hospitalist.  Hypokalemia noted, repletion orders written.  Bed switched to stepdown.  Hospitalist at bedside updated on new findings.   Date: 02/13/2014  Rate: 116  Rhythm: atrial fibrillation and premature ventricular contractions  (PVC)  QRS Axis: normal  Intervals: normal  ST/T Wave abnormalities: nonspecific ST/T changes  Conduction Disutrbances:none  Narrative Interpretation:   Old EKG Reviewed: changes noted    Kalman Drape, MD 02/13/14 [phone]

## 2014-02-13 NOTE — ED Notes (Signed)
Reported a-fib with rate ranging 117-123 to Dr. Sharol Given.  She gives verbal order for EKG.

## 2014-02-13 NOTE — ED Notes (Signed)
Admitting physician at bedside

## 2014-02-13 NOTE — ED Notes (Signed)
[facility] came to re-wrap a venous stasis ulcer on left leg. Here may 6th for debridement of ulcer. Was getting iv vancomycin but developed an allergic reaction. Vancomycin because of pseudomonas in leg. Tonight, shaking more frequent.  What brings here for ? Infection, fever and vomiting. Similar symptoms last time when dx. With pseudomonas.

## 2014-02-13 NOTE — Telephone Encounter (Signed)
Patient called to inform Dr. Johnnye Sima she is currently admitted at [facility], room 3[facility]8. Myrtis Hopping

## 2014-02-13 NOTE — ED Notes (Signed)
Phlebotomy at bedside.

## 2014-02-13 NOTE — Consult Note (Addendum)
[facility]  Total days of antibiotics 1        Day 1 piptazo               Reason for Consult:LLE diabetic ulcer    Referring Physician: vann  Active Problems:   Type II or unspecified type diabetes mellitus with unspecified complication, uncontrolled   Sepsis   CKD (chronic kidney disease), stage III   Hypokalemia   Diabetic leg ulcer   Morbid obesity- BMI 49   Adverse drug reaction   Fever   Leukocytosis    HPI: [name] is a 68 y.o. female with well controlled DM 2, venous stasis ulcers, BMI 49 obesity, who was hospitalized in early may for diabetic/venous stasis ulcer s/p I x D plus wound vac. She was placed on vancomycin and cefepime upon discharge on 5/6 but within a week on 5/13developed diffuse macularpapular rash thought to be due to vancomycin. She was started on steroids and benadryl on 5/15, and all antibiotics stopped on 5/18 (roughly the 2 wk mark). She was seen by Dr. Johnnye Sima on 5/27 who stated that he referred to [facility] for wound care plus dermatology due to rash still having eruptions despited being 9 days off of antibiotics. She has been off the wound vac for 1 week thus far and images of her leg show good granulation bed without surrounding erythema or exudate. She states that her leg has not been worse in the last few days off of therapy. She finished her steroid taper the day prior to admit. She now presents at the ED on 6/5 with complaint of confusion due to presumed hypoglycemia, low grade fever, chills.. She was found to be afebrile but in afib with RVR. She was started back on piptazo due to concern of underlying infection (which she tolerated in the past). She has remained afebrile. Lab work shows leukocytosis of 15.8 with left shift of 89%N but  Presumably elevated due to steroids. Baseline wbc appears 5-6K. Cr mildly elevated at 1.7 which she reports was at this level last week with her pcp.  She states her rash is slowly  improving on arms and legs but torso, where it has exfoliated is getting back to normal.  Past Medical History  Diagnosis Date  . Venous insufficiency   . Venous stasis ulcer   . Diabetes mellitus due to underlying condition with diabetic nephropathy   . Neuropathy   . DJD (degenerative joint disease)   . Anemia   . Nephropathy, diabetic     Allergies:  Allergies  Allergen Reactions  . Bactrim [Sulfamethoxazole-Tmp Ds] Shortness Of Breath    Depression based reactions, altered mentality  . Atorvastatin Other (See Comments)    aches  . Sulfamethoxazole-Trimethoprim Swelling  . Vancomycin     Rash, swelling , shortness of breath .  Marland Kitchen Actos [Pioglitazone Hydrochloride] Other (See Comments)    unknown  . Celebrex [Celecoxib] Other (See Comments) and Rash    unknown  . Ibuprofen Other (See Comments) and Rash  . Latex Rash  . Neosporin [Neomycin-Bacitracin Zn-Polymyx] Rash  . Pioglitazone Rash     MEDICATIONS: . famotidine  40 mg Oral Daily  . heparin  5,000 Units Subcutaneous 3 times per day  . insulin aspart  0-15 Units Subcutaneous TID WC  . insulin aspart  0-5 Units Subcutaneous QHS  . insulin aspart protamine- aspart  50 Units Subcutaneous BID WC  . iron polysaccharides  150 mg Oral  BID  . piperacillin-tazobactam (ZOSYN)  IV  3.375 g Intravenous Q8H  . potassium chloride  10 mEq Oral BID  . sodium chloride  3 mL Intravenous Q12H    History  Substance Use Topics  . Smoking status: Never Smoker   . Smokeless tobacco: Never Used  . Alcohol Use: No    Family History  Problem Relation Age of Onset  . Heart disease Mother   . Heart disease Father   . Diabetes Maternal Grandmother     Review of Systems  Constitutional: Negative for fever, chills, diaphoresis, activity change, appetite change, fatigue and unexpected weight change.  HENT: Negative for congestion, sore throat, rhinorrhea, sneezing, trouble swallowing and sinus pressure.  Eyes: Negative for  photophobia and visual disturbance.  Respiratory: Negative for cough, chest tightness, shortness of breath, wheezing and stridor.  Cardiovascular: Negative for chest pain, palpitations and leg swelling.  Gastrointestinal: Negative for nausea, vomiting, abdominal pain, diarrhea, constipation, blood in stool, abdominal distention and anal bleeding.  Genitourinary: Negative for dysuria, hematuria, flank pain and difficulty urinating.  Musculoskeletal: Negative for myalgias, back pain, joint swelling, arthralgias and gait problem.  Skin: Negative for color change, pallor, rash and wound.  Neurological: Negative for dizziness, tremors, weakness and light-headedness.  Hematological: Negative for adenopathy. Does not bruise/bleed easily.  Psychiatric/Behavioral: Negative for behavioral problems, confusion, sleep disturbance, dysphoric mood, decreased concentration and agitation.     OBJECTIVE: Temp:  [98.5 F (36.9 C)-100.3 F (37.9 C)] 98.7 F (37.1 C) (06/05 0718) Pulse Rate:  [76-126] 82 (06/05 1120) Resp:  [15-28] 15 (06/05 1120) BP: (97-158)/(41-99) 125/63 mmHg (06/05 1120) SpO2:  [91 %-100 %] 99 % (06/05 1127)  Constitutional:  oriented to person, place, and time. appears well-developed and well-nourished. No distress.  HENT:  Mouth/Throat: Oropharynx is clear and moist. No oropharyngeal exudate.  Cardiovascular: Normal rate, regular rhythm and normal heart sounds. Exam reveals no gallop and no friction rub.  No murmur heard.  Pulmonary/Chest: Effort normal and breath sounds normal. No respiratory distress.  has no wheezes.  Abdominal: Soft. Bowel sounds are normal.  exhibits no distension. There is no tenderness.  Lymphadenopathy: no cervical adenopathy.  Neurological: alert and oriented to person, place, and time.  Skin: Skin is warm and dry. Dry macular erythamatous rash to arms and upper legs( consistent with drug rash) Psychiatric: a normal mood and affect.  behavior is normal.     LABS: Results for orders placed during the hospital encounter of 02/13/14 (from the past 48 hour(s))  HEPATIC FUNCTION PANEL     Status: Abnormal   Collection Time    02/13/14  3:33 AM      Result Value Ref Range   Total Protein 8.5 (*) 6.0 - 8.3 g/dL   Albumin 4.1  3.5 - 5.2 g/dL   AST 29  0 - 37 U/L   ALT 26  0 - 35 U/L   Alkaline Phosphatase 102  39 - 117 U/L   Total Bilirubin 2.1 (*) 0.3 - 1.2 mg/dL   Bilirubin, Direct 0.8 (*) 0.0 - 0.3 mg/dL   Indirect Bilirubin 1.3 (*) 0.3 - 0.9 mg/dL  CBC WITH DIFFERENTIAL     Status: Abnormal   Collection Time    02/13/14  3:35 AM      Result Value Ref Range   WBC 15.9 (*) 4.0 - 10.5 K/uL   RBC 4.11  3.87 - 5.11 MIL/uL   Hemoglobin 12.0  12.0 - 15.0 g/dL   HCT 36.4  36.0 -  46.0 %   MCV 88.6  78.0 - 100.0 fL   MCH 29.2  26.0 - 34.0 pg   MCHC 33.0  30.0 - 36.0 g/dL   RDW 16.3 (*) 11.5 - 15.5 %   Platelets 206  150 - 400 K/uL   Neutrophils Relative % 89 (*) 43 - 77 %   Neutro Abs 14.3 (*) 1.7 - 7.7 K/uL   Lymphocytes Relative 5 (*) 12 - 46 %   Lymphs Abs 0.7  0.7 - 4.0 K/uL   Monocytes Relative 4  3 - 12 %   Monocytes Absolute 0.6  0.1 - 1.0 K/uL   Eosinophils Relative 2  0 - 5 %   Eosinophils Absolute 0.4  0.0 - 0.7 K/uL   Basophils Relative 0  0 - 1 %   Basophils Absolute 0.0  0.0 - 0.1 K/uL  BASIC METABOLIC PANEL     Status: Abnormal   Collection Time    02/13/14  3:35 AM      Result Value Ref Range   Sodium 138  137 - 147 mEq/L   Potassium 3.1 (*) 3.7 - 5.3 mEq/L   Chloride 95 (*) 96 - 112 mEq/L   CO2 25  19 - 32 mEq/L   Glucose, Bld 71  70 - 99 mg/dL   BUN 33 (*) 6 - 23 mg/dL   Creatinine, Ser 1.74 (*) 0.50 - 1.10 mg/dL   Calcium 9.9  8.4 - 10.5 mg/dL   GFR calc non Af Amer 29 (*) >90 mL/min   GFR calc Af Amer 33 (*) >90 mL/min   Comment: (NOTE)     The eGFR has been calculated using the CKD EPI equation.     This calculation has not been validated in all clinical situations.     eGFR's persistently <90 mL/min  signify possible Chronic Kidney     Disease.  I-STAT CG4 LACTIC ACID, ED     Status: None   Collection Time    02/13/14  3:49 AM      Result Value Ref Range   Lactic Acid, Venous 1.92  0.5 - 2.2 mmol/L  URINALYSIS, ROUTINE W REFLEX MICROSCOPIC     Status: None   Collection Time    02/13/14  4:50 AM      Result Value Ref Range   Color, Urine YELLOW  YELLOW   APPearance CLEAR  CLEAR   Specific Gravity, Urine 1.014  1.005 - 1.030   pH 5.0  5.0 - 8.0   Glucose, UA NEGATIVE  NEGATIVE mg/dL   Hgb urine dipstick NEGATIVE  NEGATIVE   Bilirubin Urine NEGATIVE  NEGATIVE   Ketones, ur NEGATIVE  NEGATIVE mg/dL   Protein, ur NEGATIVE  NEGATIVE mg/dL   Urobilinogen, UA 0.2  0.0 - 1.0 mg/dL   Nitrite NEGATIVE  NEGATIVE   Leukocytes, UA NEGATIVE  NEGATIVE   Comment: MICROSCOPIC NOT DONE ON URINES WITH NEGATIVE PROTEIN, BLOOD, LEUKOCYTES, NITRITE, OR GLUCOSE <1000 mg/dL.  CBG MONITORING, ED     Status: Abnormal   Collection Time    02/13/14  9:55 AM      Result Value Ref Range   Glucose-Capillary 148 (*) 70 - 99 mg/dL  MRSA PCR SCREENING     Status: None   Collection Time    02/13/14 12:41 PM      Result Value Ref Range   MRSA by PCR NEGATIVE  NEGATIVE   Comment:            The  GeneXpert MRSA Assay (FDA     approved for NASAL specimens     only), is one component of a     comprehensive MRSA colonization     surveillance program. It is not     intended to diagnose MRSA     infection nor to guide or     monitor treatment for     MRSA infections.  TSH     Status: None   Collection Time    02/13/14  1:10 PM      Result Value Ref Range   TSH 2.220  0.350 - 4.500 uIU/mL  TROPONIN I     Status: None   Collection Time    02/13/14  1:10 PM      Result Value Ref Range   Troponin I <0.30  <0.30 ng/mL   Comment:            Due to the release kinetics of cTnI,     a negative result within the first hours     of the onset of symptoms does not rule out     myocardial infarction with  certainty.     If myocardial infarction is still suspected,     repeat the test at appropriate intervals.  GLUCOSE, CAPILLARY     Status: Abnormal   Collection Time    02/13/14  3:40 PM      Result Value Ref Range   Glucose-Capillary 349 (*) 70 - 99 mg/dL   Comment 1 Notify RN     Comment 2 Documented in Chart      MICRO: 6/5 blood cx pending IMAGING: Dg Chest Port 1 View  02/13/2014   CLINICAL DATA:  fever  EXAM: PORTABLE CHEST - 1 VIEW  COMPARISON:  Prior radiograph from 02/11/2014  FINDINGS: Right-sided thyroid line has been removed. Cardiomegaly is stable. Mediastinal silhouette within normal limits.  Lungs are normally inflated. There is diffuse peribronchial thickening, which may reflect active bronchiolitis given the history of fever. No consolidative airspace disease. No pulmonary edema or pleural effusion. No pneumothorax.  Osseous structures are unchanged with no acute osseous abnormality identified.  IMPRESSION: 1. Diffuse peribronchial thickening, which may reflect bronchiolitis in the setting of fever. No consolidative airspace disease identified. 2. Stable cardiomegaly without pulmonary edema.   Electronically Signed   By: Jeannine Boga M.D.   On: 02/13/2014 06:38   Assessment/Plan: 68yo F with DM, prior treatment for lower leg ulcer presents with low grade fever, AMS reportedly from hypoglycemia is found to have leukocytosis concern for underlying infection as well as afib with rvr  - for now agree with piptazo for empiric coverage. If blood culture return as NGTD in 48-72hr, would discontinue antibiotics  - drug rash = appears improving. No need for further steroids, will watch if it worsens, then we would attribute it to pcn. Thus far, it has been assumed that the rash is due to vanco  - lle venous stasis ulcer = noted to be an 4 x 8 irregular ulcer to left lower lateral leg. Wound has good granulation tissue in the base. There are a few spots of fibrinous tissue. There  is no surrounding erythema warmth or crepitus.   appears that it is improving, with local wound care. Defer to plastics for recs on its management.  - leukocytosis = would continue to check cbc to ensure it is trending down  Dr. Lucianne Lei dam to provide further recs over the weekend  [place]. Baxter Flattery MD MPH  [facility] [phone]

## 2014-02-13 NOTE — Progress Notes (Signed)
Utilization review completed.  

## 2014-02-13 NOTE — ED Notes (Signed)
IV team paged.  

## 2014-02-13 NOTE — H&P (Signed)
[facility] History and Physical  [name] VEL:[id number] DOB: 07/08/1946 DOA: 02/13/2014  Referring physician: er PCP: Shirline Frees, MD   Chief Complaint: fevers, AMS  HPI: [name] is a 68 y.o. female  with DM2, morbid obesity (BMI 40), chronic LLE ulcer. Prev pseudomonas (R- aztreonam, cipro).   She was admtted 5-3 to 5-7 with worsening of this ulcer, sepsis. She was evaluated by [facility] and had I &D, placement of VAC on 5-6. She was d/c home with vanco/cefepime. By 5-13 she had developed a diffuse erythematous rash on her trunk. Abx were stopped and she was referred to derm. Seen by ID 5/27.  Wound vac was recently removed.    She come to the ER today, with the complaint of fever, shaking chills, confusion. Family states this is similar presentation that she had last month prior to debridement of any ulcer on her left leg. Patient has been doing well at home. Wound VAC was removed on Monday to let the area rest.  In the Er, her mental status was improved and she was able to give the history, she also went into a fib with RVR.  No chest pain, no SOB. hospitalist were asked to admit   Review of Systems:  All systems reviewed, negative unless stated above   Past Medical History  Diagnosis Date  . Venous insufficiency   . Venous stasis ulcer   . Diabetes mellitus due to underlying condition with diabetic nephropathy   . Neuropathy   . DJD (degenerative joint disease)   . Anemia   . Nephropathy, diabetic    Past Surgical History  Procedure Laterality Date  . Tonsilectomy, adenoidectomy, bilateral myringotomy and tubes    . Mouth surgery    . I&d extremity Left 01/14/2014    Procedure: IRRIGATION AND DEBRIDEMENT LEFT LOWER EXTREMITY POSSIBLE ACELL AND POSSIBLE VAC PLACEMENT;  Surgeon: Theodoro Kos, DO;  Location: [facility];  Service: Plastics;  Laterality: Left;   Social History:  reports that she has never smoked. She has never used smokeless tobacco. She  reports that she does not drink alcohol or use illicit drugs.  Allergies  Allergen Reactions  . Bactrim [Sulfamethoxazole-Tmp Ds] Shortness Of Breath    Depression based reactions, altered mentality  . Atorvastatin Other (See Comments)    aches  . Sulfamethoxazole-Trimethoprim Swelling  . Vancomycin     Rash, swelling , shortness of breath .  Marland Kitchen Actos [Pioglitazone Hydrochloride] Other (See Comments)    unknown  . Celebrex [Celecoxib] Other (See Comments) and Rash    unknown  . Ibuprofen Other (See Comments) and Rash  . Latex Rash  . Neosporin [Neomycin-Bacitracin Zn-Polymyx] Rash  . Pioglitazone Rash    Family History  Problem Relation Age of Onset  . Heart disease Mother   . Heart disease Father   . Diabetes Maternal Grandmother      Prior to Admission medications   Medication Sig Start Date End Date Taking? Authorizing Provider  bumetanide (BUMEX) 2 MG tablet Take 2 mg by mouth daily.    Yes Historical Provider, MD  COD LIVER OIL PO Take 1,000 mg by mouth daily.     Yes Historical Provider, MD  famotidine (PEPCID) 40 MG tablet Take 1 tablet (40 mg total) by mouth daily. 01/26/14  Yes Campbell Riches, MD  glipiZIDE (GLUCOTROL) 10 MG tablet Take 10 mg by mouth daily before breakfast.    Yes Historical Provider, MD  hydrochlorothiazide 25 MG tablet Take 25 mg by  mouth daily.    Yes Historical Provider, MD  hydrOXYzine (ATARAX/VISTARIL) 50 MG tablet Take 50 mg by mouth 3 (three) times daily as needed for itching.    Yes Historical Provider, MD  insulin NPH-insulin regular (NOVOLIN 70/30) (70-30) 100 UNIT/ML injection Inject 80 Units into the skin 2 (two) times daily with a meal.    Yes Historical Provider, MD  insulin regular (NOVOLIN R,HUMULIN R) 100 units/mL injection Inject 0-20 Units into the skin daily as needed for high blood sugar. Sliding scale   Yes Historical Provider, MD  iron polysaccharides (NIFEREX) 150 MG capsule Take 150 mg by mouth 2 (two) times daily.    Yes  Historical Provider, MD  mupirocin ointment (BACTROBAN) 2 % Apply 1 application topically daily. 11/14/13  Yes Historical Provider, MD  OVER THE COUNTER MEDICATION Take 1 tablet by mouth 2 (two) times daily. [place]   Yes Historical Provider, MD  potassium chloride (K-DUR,KLOR-CON) 10 MEQ tablet Take 10 mEq by mouth 2 (two) times daily.   Yes Historical Provider, MD  timolol (TIMOPTIC) 0.5 % ophthalmic solution Place 1 drop into both eyes daily. 10/16/13  Yes Historical Provider, MD  traMADol (ULTRAM) 50 MG tablet Take 50 mg by mouth every 6 (six) hours as needed for moderate pain.    Yes Historical Provider, MD  vitamin C (ASCORBIC ACID) 500 MG tablet Take 500 mg by mouth 2 (two) times daily.    Yes Historical Provider, MD  predniSONE (STERAPRED UNI-PAK) 10 MG tablet Take by mouth daily. 30mg  po daily for 4 days, 20mg  po daily for 4 days, 10mg  po daily for 4 days, 5mg  po daily for 4 days. 01/26/14   Campbell Riches, MD   Physical Exam: Filed Vitals:   02/13/14 0820  BP: 97/48  Pulse:   Temp:   Resp:     BP 97/48  Pulse 109  Temp(Src) 98.7 F (37.1 C) (Oral)  Resp 26  SpO2 99%  General:  Appears calm and comfortable- obese Eyes: PERRL, normal lids, irises & conjunctiva ENT: grossly normal hearing, lips & tongue Neck: no LAD, masses or thyromegaly Cardiovascular: iRR, no m/r/g. Rapid, LE edema +B/L Respiratory: CTA bilaterally, no w/r/r. Normal respiratory effort. Abdomen: soft, ntnd Skin: left leg has large ulceration Musculoskeletal: grossly normal tone BUE/BLE Psychiatric: grossly normal mood and affect, speech fluent and appropriate Neurologic: grossly non-focal.          Labs on Admission:  Basic Metabolic Panel:  Recent Labs Lab 02/13/14 0335  NA 138  K 3.1*  CL 95*  CO2 25  GLUCOSE 71  BUN 33*  CREATININE 1.74*  CALCIUM 9.9   Liver Function Tests:  Recent Labs Lab 02/13/14 0333  AST 29  ALT 26  ALKPHOS 102  BILITOT 2.1*  PROT 8.5*  ALBUMIN 4.1    No results found for this basename: LIPASE, AMYLASE,  in the last 168 hours No results found for this basename: AMMONIA,  in the last 168 hours CBC:  Recent Labs Lab 02/13/14 0335  WBC 15.9*  NEUTROABS 14.3*  HGB 12.0  HCT 36.4  MCV 88.6  PLT 206   Cardiac Enzymes: No results found for this basename: CKTOTAL, CKMB, CKMBINDEX, TROPONINI,  in the last 168 hours  BNP (last 3 results) No results found for this basename: PROBNP,  in the last 8760 hours CBG: No results found for this basename: GLUCAP,  in the last 168 hours  Radiological Exams on Admission: Dg Chest Port 1 View  02/13/2014  CLINICAL DATA:  fever  EXAM: PORTABLE CHEST - 1 VIEW  COMPARISON:  Prior radiograph from 02/11/2014  FINDINGS: Right-sided thyroid line has been removed. Cardiomegaly is stable. Mediastinal silhouette within normal limits.  Lungs are normally inflated. There is diffuse peribronchial thickening, which may reflect active bronchiolitis given the history of fever. No consolidative airspace disease. No pulmonary edema or pleural effusion. No pneumothorax.  Osseous structures are unchanged with no acute osseous abnormality identified.  IMPRESSION: 1. Diffuse peribronchial thickening, which may reflect bronchiolitis in the setting of fever. No consolidative airspace disease identified. 2. Stable cardiomegaly without pulmonary edema.   Electronically Signed   By: Jeannine Boga M.D.   On: 02/13/2014 06:38    EKG: Independently reviewed.a fib rate 130  Assessment/Plan Active Problems:   Type II or unspecified type diabetes mellitus with unspecified complication, uncontrolled   Sepsis   CKD (chronic kidney disease), stage III   Hypokalemia   Diabetic leg ulcer   Morbid obesity- BMI 49   Adverse drug reaction   Fever  Fever with LE leg ulcer  Sepsis- blood cultures pending- IV zosyn started- abx had been held due to rxn to vancomycine, ID consult  DM- SSI, continue home meds and carb mod  diet  Leukocytosis- monitor  Hypotension- gentle IVF, holding diuretics  Morbid obesity- OSA study  Anemia- on FE- Hgb higher but suspect it will drop some with IVF  A fib with RVR- cards consult, recent echo, IV lopressor- monitor as low BP  Hypokalemia- replace  CKD- stable  Cards consult ID consult  Code Status: full Family Communication: patient and daughter/husband at bedside Disposition Plan: admit  Time spent: 75 min  [facility] Pager [phone]  **Disclaimer: This note may have been dictated with voice recognition software. Similar sounding words can inadvertently be transcribed and this note may contain transcription errors which may not have been corrected upon publication of note.**

## 2014-02-13 NOTE — Progress Notes (Signed)
Pt arrived from ED, VSS, will continue to monitor.

## 2014-02-14 DIAGNOSIS — E1169 Type 2 diabetes mellitus with other specified complication: Secondary | ICD-10-CM

## 2014-02-14 DIAGNOSIS — L97909 Non-pressure chronic ulcer of unspecified part of unspecified lower leg with unspecified severity: Secondary | ICD-10-CM

## 2014-02-14 LAB — GLUCOSE, CAPILLARY
GLUCOSE-CAPILLARY: 204 mg/dL — AB (ref 70–99)
GLUCOSE-CAPILLARY: 215 mg/dL — AB (ref 70–99)
GLUCOSE-CAPILLARY: 152 mg/dL — AB (ref 70–99)
GLUCOSE-CAPILLARY: 132 mg/dL — AB (ref 70–99)
Glucose-Capillary: 253 mg/dL — ABNORMAL HIGH (ref 70–99)
Glucose-Capillary: 284 mg/dL — ABNORMAL HIGH (ref 70–99)

## 2014-02-14 LAB — URINE CULTURE
COLONY COUNT: NO GROWTH
CULTURE: NO GROWTH

## 2014-02-14 LAB — BASIC METABOLIC PANEL
BUN: 43 mg/dL — AB (ref 6–23)
CHLORIDE: 96 meq/L (ref 96–112)
CO2: 24 mEq/L (ref 19–32)
CREATININE: 2.23 mg/dL — AB (ref 0.50–1.10)
Calcium: 8.9 mg/dL (ref 8.4–10.5)
GFR calc non Af Amer: 21 mL/min — ABNORMAL LOW (ref >90)
GFR, EST AFRICAN AMERICAN: 25 mL/min — AB (ref >90)
GLUCOSE: 240 mg/dL — AB (ref 70–99)
Potassium: 4 mEq/L (ref 3.7–5.3)
Sodium: 134 mEq/L — ABNORMAL LOW (ref 137–147)

## 2014-02-14 LAB — TROPONIN I
TROPONIN I: 0.45 ng/mL — AB (ref <0.30)
TROPONIN I: 0.35 ng/mL — AB (ref <0.30)
Troponin I: 0.35 ng/mL (ref <0.30)
Troponin I: 0.39 ng/mL (ref <0.30)

## 2014-02-14 LAB — CBC
HCT: 26.7 % — ABNORMAL LOW (ref 36.0–46.0)
HEMOGLOBIN: 8.9 g/dL — AB (ref 12.0–15.0)
MCH: 29 pg (ref 26.0–34.0)
MCHC: 33.3 g/dL (ref 30.0–36.0)
MCV: 87 fL (ref 78.0–100.0)
Platelets: 142 10*3/uL — ABNORMAL LOW (ref 150–400)
RBC: 3.07 MIL/uL — ABNORMAL LOW (ref 3.87–5.11)
RDW: 16.6 % — AB (ref 11.5–15.5)
WBC: 8.9 10*3/uL (ref 4.0–10.5)

## 2014-02-14 MED ORDER — BUMETANIDE 2 MG PO TABS
2.0000 mg | ORAL_TABLET | Freq: Every day | ORAL | Status: DC
  Administered 2014-02-14: 2 mg via ORAL
  Filled 2014-02-14 (×2): qty 1

## 2014-02-14 MED ORDER — INSULIN ASPART PROT & ASPART (70-30 MIX) 100 UNIT/ML ~~LOC~~ SUSP
65.0000 [IU] | Freq: Two times a day (BID) | SUBCUTANEOUS | Status: DC
  Administered 2014-02-14 – 2014-02-17 (×7): 65 [IU] via SUBCUTANEOUS
  Filled 2014-02-14: qty 10

## 2014-02-14 MED ORDER — ACETAMINOPHEN 325 MG PO TABS
650.0000 mg | ORAL_TABLET | Freq: Four times a day (QID) | ORAL | Status: DC | PRN
  Administered 2014-02-14 – 2014-02-16 (×5): 650 mg via ORAL
  Filled 2014-02-14 (×5): qty 2

## 2014-02-14 MED ORDER — TIMOLOL MALEATE 0.5 % OP SOLN
1.0000 [drp] | Freq: Every day | OPHTHALMIC | Status: DC
  Administered 2014-02-14 – 2014-02-17 (×4): 1 [drp] via OPHTHALMIC
  Filled 2014-02-14: qty 5

## 2014-02-14 MED ORDER — LATANOPROST 0.005 % OP SOLN
1.0000 [drp] | Freq: Every day | OPHTHALMIC | Status: DC
  Administered 2014-02-14 – 2014-02-16 (×3): 1 [drp] via OPHTHALMIC
  Filled 2014-02-14: qty 2.5

## 2014-02-14 MED ORDER — RISAQUAD PO CAPS
1.0000 | ORAL_CAPSULE | Freq: Two times a day (BID) | ORAL | Status: DC
  Administered 2014-02-14 (×2): 1 via ORAL
  Filled 2014-02-14 (×4): qty 1

## 2014-02-14 NOTE — Progress Notes (Signed)
PROGRESS NOTE  [name] TMH:[id number] DOB: 07/15/1946 DOA: 02/13/2014 PCP: Shirline Frees, MD  Assessment/Plan: Sepsis- blood cultures pending- IV zosyn started- abx had been held due to  ? rxn to vancomycin, ID consult   DM- SSI, continue home meds and carb mod diet   Leukocytosis- monitor, resolved, has been on steroids  Hypotension-  holding diuretics, d/c IVF  Morbid obesity- OSA study   Anemia- on FE- Hgb higher but suspect it will drop some with IVF   A fib with RVR- cards consult, recent echo, IV lopressor  Hypokalemia- replace   CKD- holding some diuretics, trend- had some low BPs yesterday  Mild bump in troponin- trend until downward trend- ?from kidneys and demand from a fib/sepsis  Code Status: full Family Communication: patient Disposition Plan: admit   Consultants:  ID  Plastics  cards  Procedures:     HPI/Subjective: Feeling better this AM Requesting eye drops, did have some back pain  Objective: Filed Vitals:   02/14/14 0400  BP: 119/30  Pulse: 61  Temp: 98.1 F (36.7 C)  Resp: 18    Intake/Output Summary (Last 24 hours) at 02/14/14 0756 Last data filed at 02/14/14 0500  Gross per 24 hour  Intake   1010 ml  Output    300 ml  Net    710 ml   There were no vitals filed for this visit.  Exam:   General:  A+Ox3, NAD  Cardiovascular: rrr  Respiratory: clear but decreased  Abdomen: obese, +BS  Musculoskeletal: left leg wrapped on LE   Data Reviewed: Basic Metabolic Panel:  Recent Labs Lab 02/13/14 0335 02/14/14 0330  NA 138 134*  K 3.1* 4.0  CL 95* 96  CO2 25 24  GLUCOSE 71 240*  BUN 33* 43*  CREATININE 1.74* 2.23*  CALCIUM 9.9 8.9   Liver Function Tests:  Recent Labs Lab 02/13/14 0333  AST 29  ALT 26  ALKPHOS 102  BILITOT 2.1*  PROT 8.5*  ALBUMIN 4.1   No results found for this basename: LIPASE, AMYLASE,  in the last 168 hours No results found for this basename: AMMONIA,  in the last 168  hours CBC:  Recent Labs Lab 02/13/14 0335 02/14/14 0330  WBC 15.9* 8.9  NEUTROABS 14.3*  --   HGB 12.0 8.9*  HCT 36.4 26.7*  MCV 88.6 87.0  PLT 206 142*   Cardiac Enzymes:  Recent Labs Lab 02/13/14 1310 02/13/14 1938 02/14/14 0110  TROPONINI <0.30 <0.30 0.35*   BNP (last 3 results) No results found for this basename: PROBNP,  in the last 8760 hours CBG:  Recent Labs Lab 02/13/14 0955 02/13/14 1540 02/13/14 2142 02/14/14 0450 02/14/14 0715  GLUCAP 148* 349* 253* 204* 284*    Recent Results (from the past 240 hour(s))  MRSA PCR SCREENING     Status: None   Collection Time    02/13/14 12:41 PM      Result Value Ref Range Status   MRSA by PCR NEGATIVE  NEGATIVE Final   Comment:            The GeneXpert MRSA Assay (FDA     approved for NASAL specimens     only), is one component of a     comprehensive MRSA colonization     surveillance program. It is not     intended to diagnose MRSA     infection nor to guide or     monitor treatment for     MRSA infections.  Studies: Dg Chest Port 1 View  02/13/2014   CLINICAL DATA:  fever  EXAM: PORTABLE CHEST - 1 VIEW  COMPARISON:  Prior radiograph from 02/11/2014  FINDINGS: Right-sided thyroid line has been removed. Cardiomegaly is stable. Mediastinal silhouette within normal limits.  Lungs are normally inflated. There is diffuse peribronchial thickening, which may reflect active bronchiolitis given the history of fever. No consolidative airspace disease. No pulmonary edema or pleural effusion. No pneumothorax.  Osseous structures are unchanged with no acute osseous abnormality identified.  IMPRESSION: 1. Diffuse peribronchial thickening, which may reflect bronchiolitis in the setting of fever. No consolidative airspace disease identified. 2. Stable cardiomegaly without pulmonary edema.   Electronically Signed   By: Jeannine Boga M.D.   On: 02/13/2014 06:38    Scheduled Meds: . acidophilus  1 capsule Oral BID  .  famotidine  40 mg Oral Daily  . heparin  5,000 Units Subcutaneous 3 times per day  . insulin aspart  0-15 Units Subcutaneous TID WC  . insulin aspart  0-5 Units Subcutaneous QHS  . insulin aspart protamine- aspart  65 Units Subcutaneous BID WC  . iron polysaccharides  150 mg Oral BID  . latanoprost  1 drop Both Eyes QHS  . piperacillin-tazobactam (ZOSYN)  IV  3.375 g Intravenous Q8H  . potassium chloride  10 mEq Oral BID  . sodium chloride  3 mL Intravenous Q12H  . timolol  1 drop Both Eyes Daily   Continuous Infusions:  Antibiotics Given (last 72 hours)   Date/Time Action Medication Dose Rate   02/13/14 1445 Given   piperacillin-tazobactam (ZOSYN) IVPB 3.375 g 3.375 g 12.5 mL/hr   02/13/14 2202 Given   piperacillin-tazobactam (ZOSYN) IVPB 3.375 g 3.375 g 12.5 mL/hr   02/14/14 0549 Given   piperacillin-tazobactam (ZOSYN) IVPB 3.375 g 3.375 g 12.5 mL/hr      Active Problems:   Type II or unspecified type diabetes mellitus with unspecified complication, uncontrolled   Sepsis   CKD (chronic kidney disease), stage III   Hypokalemia   Diabetic leg ulcer   Morbid obesity- BMI 49   Adverse drug reaction   Fever   Leukocytosis    Time spent: 35 min    [facility] Pager [phone]. If 7PM-7AM, please contact night-coverage at [email or web address], password [facility] 02/14/2014, 7:56 AM  LOS: 1 day

## 2014-02-14 NOTE — Progress Notes (Signed)
       Patient Name: [name] Date of Encounter: 02/14/2014    SUBJECTIVE: In bathroom during rounds but denies cardiac complaints.  TELEMETRY:  Had spontaneous conversion back to NSR. Filed Vitals:   02/13/14 2007 02/14/14 0017 02/14/14 0400 02/14/14 0730  BP: 117/42 100/32 119/30   Pulse: 63  61   Temp: 98 F (36.7 C) 98.6 F (37 C) 98.1 F (36.7 C) 98.3 F (36.8 C)  TempSrc: Oral Oral Oral Oral  Resp: 20 27 18    SpO2: 100% 96% 96%     Intake/Output Summary (Last 24 hours) at 02/14/14 1002 Last data filed at 02/14/14 0549  Gross per 24 hour  Intake   1060 ml  Output    300 ml  Net    760 ml   LABS: Basic Metabolic Panel:  Recent Labs  02/13/14 0335 02/14/14 0330  NA 138 134*  K 3.1* 4.0  CL 95* 96  CO2 25 24  GLUCOSE 71 240*  BUN 33* 43*  CREATININE 1.74* 2.23*  CALCIUM 9.9 8.9   CBC:  Recent Labs  02/13/14 0335 02/14/14 0330  WBC 15.9* 8.9  NEUTROABS 14.3*  --   HGB 12.0 8.9*  HCT 36.4 26.7*  MCV 88.6 87.0  PLT 206 142*   Cardiac Enzymes:  Recent Labs  02/13/14 1938 02/14/14 0110 02/14/14 0902  TROPONINI <0.30 0.35* 0.45*   BNP No results found for this basename: probnp     Radiology/Studies:  CXR with CE but no chf  Physical Exam: Blood pressure 119/30, pulse 61, temperature 98.3 F (36.8 C), temperature source Oral, resp. rate 18, SpO2 96.00%. Weight change:   Wt Readings from Last 3 Encounters:  02/04/14 308 lb (139.708 kg)  01/12/14 314 lb 5.1 oz (142.575 kg)  01/12/14 314 lb 5.1 oz (142.575 kg)    Not examined  ASSESSMENT:  1. Not sure why the Troponin levels are high, probably from demand ischemia. 2. PAF has reverted to NSR 3. Anemia  Plan:  1.  Follow for now. If recurrent AF consider anti arrhythmic 2. Needs an ischemic w/u at some point given diabetes. Could be done as OP 3. Anticoagulation needs to be addressed once overall care plan is outlined. Reluctant to start now due to anemia.  Signed, Belva Crome III 02/14/2014, 10:02 AM

## 2014-02-15 DIAGNOSIS — R7989 Other specified abnormal findings of blood chemistry: Secondary | ICD-10-CM | POA: Diagnosis present

## 2014-02-15 DIAGNOSIS — R799 Abnormal finding of blood chemistry, unspecified: Secondary | ICD-10-CM

## 2014-02-15 DIAGNOSIS — R778 Other specified abnormalities of plasma proteins: Secondary | ICD-10-CM | POA: Diagnosis present

## 2014-02-15 LAB — CBC
HEMATOCRIT: 26 % — AB (ref 36.0–46.0)
Hemoglobin: 8.4 g/dL — ABNORMAL LOW (ref 12.0–15.0)
MCH: 28.3 pg (ref 26.0–34.0)
MCHC: 32.3 g/dL (ref 30.0–36.0)
MCV: 87.5 fL (ref 78.0–100.0)
Platelets: 156 10*3/uL (ref 150–400)
RBC: 2.97 MIL/uL — ABNORMAL LOW (ref 3.87–5.11)
RDW: 16.5 % — AB (ref 11.5–15.5)
WBC: 6.6 10*3/uL (ref 4.0–10.5)

## 2014-02-15 LAB — BASIC METABOLIC PANEL
BUN: 51 mg/dL — AB (ref 6–23)
CHLORIDE: 98 meq/L (ref 96–112)
CO2: 23 mEq/L (ref 19–32)
Calcium: 9.1 mg/dL (ref 8.4–10.5)
Creatinine, Ser: 2.76 mg/dL — ABNORMAL HIGH (ref 0.50–1.10)
GFR calc non Af Amer: 16 mL/min — ABNORMAL LOW (ref >90)
GFR, EST AFRICAN AMERICAN: 19 mL/min — AB (ref >90)
Glucose, Bld: 113 mg/dL — ABNORMAL HIGH (ref 70–99)
POTASSIUM: 3.9 meq/L (ref 3.7–5.3)
SODIUM: 136 meq/L — AB (ref 137–147)

## 2014-02-15 LAB — GLUCOSE, CAPILLARY
GLUCOSE-CAPILLARY: 89 mg/dL (ref 70–99)
GLUCOSE-CAPILLARY: 101 mg/dL — AB (ref 70–99)
GLUCOSE-CAPILLARY: 173 mg/dL — AB (ref 70–99)
GLUCOSE-CAPILLARY: 134 mg/dL — AB (ref 70–99)

## 2014-02-15 MED ORDER — SODIUM CHLORIDE 0.9 % IV SOLN
INTRAVENOUS | Status: DC
  Administered 2014-02-16: 11:00:00 via INTRAVENOUS

## 2014-02-15 MED ORDER — RISAQUAD PO CAPS
2.0000 | ORAL_CAPSULE | Freq: Two times a day (BID) | ORAL | Status: DC
  Administered 2014-02-15 – 2014-02-17 (×5): 2 via ORAL
  Filled 2014-02-15 (×6): qty 2

## 2014-02-15 MED ORDER — LOPERAMIDE HCL 2 MG PO CAPS
2.0000 mg | ORAL_CAPSULE | ORAL | Status: DC | PRN
  Administered 2014-02-15 – 2014-02-16 (×3): 2 mg via ORAL
  Filled 2014-02-15 (×4): qty 1

## 2014-02-15 NOTE — Progress Notes (Signed)
PROGRESS NOTE  [name] NWG:[id number] DOB: October 05, 1945 DOA: 02/13/2014 PCP: Shirline Frees, MD  Assessment/Plan: Sepsis- blood cultures NGTD- IV zosyn started- abx had been held at home due to  ? rxn to vancomycin, ID consult   DM- SSI, continue home meds and carb mod diet   Leukocytosis- monitor, resolved, has been on steroids  Hypotension-  holding diuretics, IVF  Morbid obesity- OSA study   Anemia- on FE- Hgb higher but suspect it will drop some with IVF   A fib with RVR- cards:  Needs an ischemic w/u at some point given diabetes. Could be done as OP.  Anticoagulation needs to be addressed once overall care plan is outlined. Reluctant to start now due to anemia recent echo IV lopressor  Hypokalemia- replace   AKI on CKD- holding some diuretics, trend- had some low BPs yesterday, IVF  Mild bump in troponin- trend until downward trend- ?from kidneys and demand from a fib/sepsis  Code Status: full Family Communication: patient Disposition Plan: admit   Consultants:  ID  Plastics  cards  Procedures:     HPI/Subjective: Feeling better this AM Some diarrhea  Objective: Filed Vitals:   02/15/14 0404  BP: 115/43  Pulse: 66  Temp: 98.8 F (37.1 C)  Resp: 18    Intake/Output Summary (Last 24 hours) at 02/15/14 0847 Last data filed at 02/14/14 1827  Gross per 24 hour  Intake    290 ml  Output    250 ml  Net     40 ml   There were no vitals filed for this visit.  Exam:   General:  A+Ox3, NAD  Cardiovascular: rrr  Respiratory: clear but decreased  Abdomen: obese, +BS  Musculoskeletal: left leg wrapped on LE   Data Reviewed: Basic Metabolic Panel:  Recent Labs Lab 02/13/14 0335 02/14/14 0330 02/15/14 0325  NA 138 134* 136*  K 3.1* 4.0 3.9  CL 95* 96 98  CO2 25 24 23   GLUCOSE 71 240* 113*  BUN 33* 43* 51*  CREATININE 1.74* 2.23* 2.76*  CALCIUM 9.9 8.9 9.1   Liver Function Tests:  Recent Labs Lab 02/13/14 0333  AST 29  ALT  26  ALKPHOS 102  BILITOT 2.1*  PROT 8.5*  ALBUMIN 4.1   No results found for this basename: LIPASE, AMYLASE,  in the last 168 hours No results found for this basename: AMMONIA,  in the last 168 hours CBC:  Recent Labs Lab 02/13/14 0335 02/14/14 0330 02/15/14 0325  WBC 15.9* 8.9 6.6  NEUTROABS 14.3*  --   --   HGB 12.0 8.9* 8.4*  HCT 36.4 26.7* 26.0*  MCV 88.6 87.0 87.5  PLT 206 142* 156   Cardiac Enzymes:  Recent Labs Lab 02/13/14 1938 02/14/14 0110 02/14/14 0902 02/14/14 1248 02/14/14 2025  TROPONINI <0.30 0.35* 0.45* 0.39* 0.35*   BNP (last 3 results) No results found for this basename: PROBNP,  in the last 8760 hours CBG:  Recent Labs Lab 02/14/14 0715 02/14/14 1242 02/14/14 1612 02/14/14 2059 02/15/14 0549  GLUCAP 284* 215* 152* 132* 89    Recent Results (from the past 240 hour(s))  CULTURE, BLOOD (ROUTINE X 2)     Status: None   Collection Time    02/13/14  3:55 AM      Result Value Ref Range Status   Specimen Description BLOOD LEFT ARM   Final   Special Requests BOTTLES DRAWN AEROBIC AND ANAEROBIC 10CC EACH   Final   Culture  Setup Time  Final   Value: 02/13/2014 08:27     Performed at [facility]   Culture     Final   Value:        BLOOD CULTURE RECEIVED NO GROWTH TO DATE CULTURE WILL BE HELD FOR 5 DAYS BEFORE ISSUING A FINAL NEGATIVE REPORT     Performed at [facility]   Report Status PENDING   Incomplete  CULTURE, BLOOD (ROUTINE X 2)     Status: None   Collection Time    02/13/14  4:02 AM      Result Value Ref Range Status   Specimen Description BLOOD RIGHT ARM   Final   Special Requests BOTTLES DRAWN AEROBIC ONLY 10CC   Final   Culture  Setup Time     Final   Value: 02/13/2014 08:28     Performed at [facility]   Culture     Final   Value:        BLOOD CULTURE RECEIVED NO GROWTH TO DATE CULTURE WILL BE HELD FOR 5 DAYS BEFORE ISSUING A FINAL NEGATIVE REPORT     Performed at [facility]   Report  Status PENDING   Incomplete  URINE CULTURE     Status: None   Collection Time    02/13/14  4:50 AM      Result Value Ref Range Status   Specimen Description URINE, CATHETERIZED   Final   Special Requests NONE   Final   Culture  Setup Time     Final   Value: 02/13/2014 05:16     Performed at [facility]     Final   Value: NO GROWTH     Performed at [facility]   Culture     Final   Value: NO GROWTH     Performed at [facility]   Report Status 02/14/2014 FINAL   Final  MRSA PCR SCREENING     Status: None   Collection Time    02/13/14 12:41 PM      Result Value Ref Range Status   MRSA by PCR NEGATIVE  NEGATIVE Final   Comment:            The GeneXpert MRSA Assay (FDA     approved for NASAL specimens     only), is one component of a     comprehensive MRSA colonization     surveillance program. It is not     intended to diagnose MRSA     infection nor to guide or     monitor treatment for     MRSA infections.     Studies: No results found.  Scheduled Meds: . acidophilus  1 capsule Oral BID  . heparin  5,000 Units Subcutaneous 3 times per day  . insulin aspart  0-15 Units Subcutaneous TID WC  . insulin aspart  0-5 Units Subcutaneous QHS  . insulin aspart protamine- aspart  65 Units Subcutaneous BID WC  . iron polysaccharides  150 mg Oral BID  . latanoprost  1 drop Both Eyes QHS  . piperacillin-tazobactam (ZOSYN)  IV  3.375 g Intravenous Q8H  . potassium chloride  10 mEq Oral BID  . sodium chloride  3 mL Intravenous Q12H  . timolol  1 drop Both Eyes Daily   Continuous Infusions: . sodium chloride     Antibiotics Given (last 72 hours)   Date/Time Action Medication Dose Rate   02/13/14 1445 Given  piperacillin-tazobactam (ZOSYN) IVPB 3.375 g 3.375 g 12.5 mL/hr   02/13/14 2202 Given   piperacillin-tazobactam (ZOSYN) IVPB 3.375 g 3.375 g 12.5 mL/hr   02/14/14 0549 Given   piperacillin-tazobactam (ZOSYN) IVPB 3.375 g 3.375 g  12.5 mL/hr   02/14/14 1336 Given   piperacillin-tazobactam (ZOSYN) IVPB 3.375 g 3.375 g 12.5 mL/hr   02/14/14 2159 Given   piperacillin-tazobactam (ZOSYN) IVPB 3.375 g 3.375 g 12.5 mL/hr   02/15/14 0607 Given   piperacillin-tazobactam (ZOSYN) IVPB 3.375 g 3.375 g 12.5 mL/hr      Active Problems:   Type II or unspecified type diabetes mellitus with unspecified complication, uncontrolled   Sepsis   CKD (chronic kidney disease), stage III   Hypokalemia   Diabetic leg ulcer   Morbid obesity- BMI 49   Adverse drug reaction   Fever   Leukocytosis    Time spent: 25 min    [facility] Pager [phone]. If 7PM-7AM, please contact night-coverage at [email or web address], password [facility] 02/15/2014, 8:47 AM  LOS: 2 days

## 2014-02-15 NOTE — Progress Notes (Signed)
SUBJECTIVE:  No complaints  OBJECTIVE:   Vitals:   Filed Vitals:   02/14/14 1230 02/14/14 1524 02/14/14 2022 02/15/14 0404  BP:  123/49 111/43 115/43  Pulse:  62 64 66  Temp: 98 F (36.7 C) 98.2 F (36.8 C) 98 F (36.7 C) 98.8 F (37.1 C)  TempSrc: Oral Oral Oral Oral  Resp:  18 18 18   SpO2:  96% 96% 96%   I&O's:   Intake/Output Summary (Last 24 hours) at 02/15/14 0934 Last data filed at 02/14/14 1827  Gross per 24 hour  Intake    290 ml  Output      0 ml  Net    290 ml   TELEMETRY: Reviewed telemetry pt in NSR:  LABS: Basic Metabolic Panel:  Recent Labs  02/14/14 0330 02/15/14 0325  NA 134* 136*  K 4.0 3.9  CL 96 98  CO2 24 23  GLUCOSE 240* 113*  BUN 43* 51*  CREATININE 2.23* 2.76*  CALCIUM 8.9 9.1   Liver Function Tests:  Recent Labs  02/13/14 0333  AST 29  ALT 26  ALKPHOS 102  BILITOT 2.1*  PROT 8.5*  ALBUMIN 4.1   No results found for this basename: LIPASE, AMYLASE,  in the last 72 hours CBC:  Recent Labs  02/13/14 0335 02/14/14 0330 02/15/14 0325  WBC 15.9* 8.9 6.6  NEUTROABS 14.3*  --   --   HGB 12.0 8.9* 8.4*  HCT 36.4 26.7* 26.0*  MCV 88.6 87.0 87.5  PLT 206 142* 156   Cardiac Enzymes:  Recent Labs  02/14/14 0902 02/14/14 1248 02/14/14 2025  TROPONINI 0.45* 0.39* 0.35*   BNP: No components found with this basename: POCBNP,  D-Dimer: No results found for this basename: DDIMER,  in the last 72 hours Hemoglobin A1C:  Recent Labs  02/13/14 1022  HGBA1C 6.3*   Fasting Lipid Panel: No results found for this basename: CHOL, HDL, LDLCALC, TRIG, CHOLHDL, LDLDIRECT,  in the last 72 hours Thyroid Function Tests:  Recent Labs  02/13/14 1310  TSH 2.220   Anemia Panel: No results found for this basename: VITAMINB12, FOLATE, FERRITIN, TIBC, IRON, RETICCTPCT,  in the last 72 hours Coag Panel:   Lab Results  Component Value Date   INR 1.27 01/14/2014    RADIOLOGY: Dg Chest Port 1 View  02/13/2014   CLINICAL DATA:  fever   EXAM: PORTABLE CHEST - 1 VIEW  COMPARISON:  Prior radiograph from 02/11/2014  FINDINGS: Right-sided thyroid line has been removed. Cardiomegaly is stable. Mediastinal silhouette within normal limits.  Lungs are normally inflated. There is diffuse peribronchial thickening, which may reflect active bronchiolitis given the history of fever. No consolidative airspace disease. No pulmonary edema or pleural effusion. No pneumothorax.  Osseous structures are unchanged with no acute osseous abnormality identified.  IMPRESSION: 1. Diffuse peribronchial thickening, which may reflect bronchiolitis in the setting of fever. No consolidative airspace disease identified. 2. Stable cardiomegaly without pulmonary edema.   Electronically Signed   By: Jeannine Boga M.D.   On: 02/13/2014 06:38   ASSESSMENT:  1. Not sure why the Troponin levels are high, probably from demand ischemia.  2. PAF has reverted to NSR  3. Anemia   Plan:  1. Follow for now. If recurrent AF consider anti arrhythmic  2. Needs an ischemic w/u at some point given diabetes. Could be done as OP  3. Anticoagulation needs to be addressed once overall care plan is outlined. Reluctant to start now due to anemia.  Sueanne Margarita, MD  02/15/2014  9:34 AM

## 2014-02-16 ENCOUNTER — Ambulatory Visit: Payer: Medicare Other | Admitting: Infectious Diseases

## 2014-02-16 DIAGNOSIS — N189 Chronic kidney disease, unspecified: Secondary | ICD-10-CM

## 2014-02-16 DIAGNOSIS — N179 Acute kidney failure, unspecified: Secondary | ICD-10-CM

## 2014-02-16 LAB — CBC
HCT: 25.8 % — ABNORMAL LOW (ref 36.0–46.0)
HEMOGLOBIN: 8.6 g/dL — AB (ref 12.0–15.0)
MCH: 29.5 pg (ref 26.0–34.0)
MCHC: 33.3 g/dL (ref 30.0–36.0)
MCV: 88.4 fL (ref 78.0–100.0)
PLATELETS: 154 10*3/uL (ref 150–400)
RBC: 2.92 MIL/uL — ABNORMAL LOW (ref 3.87–5.11)
RDW: 16.4 % — AB (ref 11.5–15.5)
WBC: 4.7 10*3/uL (ref 4.0–10.5)

## 2014-02-16 LAB — BASIC METABOLIC PANEL
BUN: 44 mg/dL — ABNORMAL HIGH (ref 6–23)
CO2: 20 mEq/L (ref 19–32)
Calcium: 9.3 mg/dL (ref 8.4–10.5)
Chloride: 98 mEq/L (ref 96–112)
Creatinine, Ser: 2.45 mg/dL — ABNORMAL HIGH (ref 0.50–1.10)
GFR calc non Af Amer: 19 mL/min — ABNORMAL LOW (ref >90)
GFR, EST AFRICAN AMERICAN: 22 mL/min — AB (ref >90)
Glucose, Bld: 142 mg/dL — ABNORMAL HIGH (ref 70–99)
POTASSIUM: 4.1 meq/L (ref 3.7–5.3)
SODIUM: 136 meq/L — AB (ref 137–147)

## 2014-02-16 LAB — GLUCOSE, CAPILLARY
GLUCOSE-CAPILLARY: 107 mg/dL — AB (ref 70–99)
Glucose-Capillary: 134 mg/dL — ABNORMAL HIGH (ref 70–99)
Glucose-Capillary: 181 mg/dL — ABNORMAL HIGH (ref 70–99)
Glucose-Capillary: 198 mg/dL — ABNORMAL HIGH (ref 70–99)

## 2014-02-16 MED ORDER — NEBIVOLOL HCL 2.5 MG PO TABS
2.5000 mg | ORAL_TABLET | Freq: Every day | ORAL | Status: DC
  Administered 2014-02-16 – 2014-02-17 (×2): 2.5 mg via ORAL
  Filled 2014-02-16 (×2): qty 1

## 2014-02-16 NOTE — Progress Notes (Signed)
Patient cultures are all negative. Recommend to continue with local wound care of her lower extremity ulcer. No need for further antibiotics at this stage. Defer to Dr. Eliseo Squires for management of demand ischemia secondary to afib with RVR on admit. If you have further questions, please call back.  Elzie [facility] [phone]

## 2014-02-16 NOTE — Progress Notes (Signed)
PROGRESS NOTE  [name] QIW:[id number] DOB: 1945-11-24 DOA: 02/13/2014 PCP: Shirline Frees, MD  Assessment/Plan: Sepsis- blood cultures NGTD- IV zosyn started- abx had been held at home due to  ? rxn to vancomycin, ID consult- culture negative  Chronic stasis ulcer--- await plastics to see again and decide on wound vac and need for abx -should see today per note  DM- SSI, continue home meds and carb mod diet   Leukocytosis- monitor, resolved, has been on steroids  Hypotension-  holding diuretics, IVF  Morbid obesity- OSA study outpatient  Anemia- on FE- Hgb higher but suspect it will drop some with IVF   A fib with RVR- cards:  Needs an ischemic w/u at some point given diabetes. Could be done as OP.  Anticoagulation needs to be addressed once overall care plan is outlined. Reluctant to start now due to anemia recent echo IV lopressor  Hypokalemia- replace   AKI on CKD- holding some diuretics, trend- had some low BPs yesterday, IVF  Mild bump in troponin- trending down- ?from kidneys and demand from a fib/sepsis  Code Status: full Family Communication: patient Disposition Plan: admit   Consultants:  ID  Plastics  cards  Procedures:     HPI/Subjective: Diarrhea better with imodium  Objective: Filed Vitals:   02/16/14 0458  BP: 102/39  Pulse: 67  Temp: 98.5 F (36.9 C)  Resp: 17    Intake/Output Summary (Last 24 hours) at 02/16/14 1151 Last data filed at 02/16/14 0900  Gross per 24 hour  Intake    840 ml  Output      0 ml  Net    840 ml   Filed Weights   02/15/14 1100  Weight: 137.258 kg (302 lb 9.6 oz)    Exam:   General:  A+Ox3, NAD  Cardiovascular: rrr  Respiratory: clear but decreased  Abdomen: obese, +BS  Musculoskeletal: left leg wrapped on LE   Data Reviewed: Basic Metabolic Panel:  Recent Labs Lab 02/13/14 0335 02/14/14 0330 02/15/14 0325 02/16/14 0450  NA 138 134* 136* 136*  K 3.1* 4.0 3.9 4.1  CL 95* 96 98 98    CO2 25 24 23 20   GLUCOSE 71 240* 113* 142*  BUN 33* 43* 51* 44*  CREATININE 1.74* 2.23* 2.76* 2.45*  CALCIUM 9.9 8.9 9.1 9.3   Liver Function Tests:  Recent Labs Lab 02/13/14 0333  AST 29  ALT 26  ALKPHOS 102  BILITOT 2.1*  PROT 8.5*  ALBUMIN 4.1   No results found for this basename: LIPASE, AMYLASE,  in the last 168 hours No results found for this basename: AMMONIA,  in the last 168 hours CBC:  Recent Labs Lab 02/13/14 0335 02/14/14 0330 02/15/14 0325 02/16/14 0450  WBC 15.9* 8.9 6.6 4.7  NEUTROABS 14.3*  --   --   --   HGB 12.0 8.9* 8.4* 8.6*  HCT 36.4 26.7* 26.0* 25.8*  MCV 88.6 87.0 87.5 88.4  PLT 206 142* 156 154   Cardiac Enzymes:  Recent Labs Lab 02/13/14 1938 02/14/14 0110 02/14/14 0902 02/14/14 1248 02/14/14 2025  TROPONINI <0.30 0.35* 0.45* 0.39* 0.35*   BNP (last 3 results) No results found for this basename: PROBNP,  in the last 8760 hours CBG:  Recent Labs Lab 02/15/14 1101 02/15/14 1611 02/15/14 2126 02/16/14 0622 02/16/14 1106  GLUCAP 101* 173* 134* 134* 181*    Recent Results (from the past 240 hour(s))  CULTURE, BLOOD (ROUTINE X 2)     Status: None  Collection Time    02/13/14  3:55 AM      Result Value Ref Range Status   Specimen Description BLOOD LEFT ARM   Final   Special Requests BOTTLES DRAWN AEROBIC AND ANAEROBIC 10CC EACH   Final   Culture  Setup Time     Final   Value: 02/13/2014 08:27     Performed at [facility]   Culture     Final   Value:        BLOOD CULTURE RECEIVED NO GROWTH TO DATE CULTURE WILL BE HELD FOR 5 DAYS BEFORE ISSUING A FINAL NEGATIVE REPORT     Performed at [facility]   Report Status PENDING   Incomplete  CULTURE, BLOOD (ROUTINE X 2)     Status: None   Collection Time    02/13/14  4:02 AM      Result Value Ref Range Status   Specimen Description BLOOD RIGHT ARM   Final   Special Requests BOTTLES DRAWN AEROBIC ONLY 10CC   Final   Culture  Setup Time     Final   Value:  02/13/2014 08:28     Performed at [facility]   Culture     Final   Value:        BLOOD CULTURE RECEIVED NO GROWTH TO DATE CULTURE WILL BE HELD FOR 5 DAYS BEFORE ISSUING A FINAL NEGATIVE REPORT     Performed at [facility]   Report Status PENDING   Incomplete  URINE CULTURE     Status: None   Collection Time    02/13/14  4:50 AM      Result Value Ref Range Status   Specimen Description URINE, CATHETERIZED   Final   Special Requests NONE   Final   Culture  Setup Time     Final   Value: 02/13/2014 05:16     Performed at [facility]     Final   Value: NO GROWTH     Performed at [facility]   Culture     Final   Value: NO GROWTH     Performed at [facility]   Report Status 02/14/2014 FINAL   Final  MRSA PCR SCREENING     Status: None   Collection Time    02/13/14 12:41 PM      Result Value Ref Range Status   MRSA by PCR NEGATIVE  NEGATIVE Final   Comment:            The GeneXpert MRSA Assay (FDA     approved for NASAL specimens     only), is one component of a     comprehensive MRSA colonization     surveillance program. It is not     intended to diagnose MRSA     infection nor to guide or     monitor treatment for     MRSA infections.     Studies: No results found.  Scheduled Meds: . acidophilus  2 capsule Oral BID  . heparin  5,000 Units Subcutaneous 3 times per day  . insulin aspart  0-15 Units Subcutaneous TID WC  . insulin aspart  0-5 Units Subcutaneous QHS  . insulin aspart protamine- aspart  65 Units Subcutaneous BID WC  . iron polysaccharides  150 mg Oral BID  . latanoprost  1 drop Both Eyes QHS  . nebivolol  2.5 mg Oral Daily  . potassium chloride  10 mEq  Oral BID  . sodium chloride  3 mL Intravenous Q12H  . timolol  1 drop Both Eyes Daily   Continuous Infusions: . sodium chloride 50 mL/hr at 02/16/14 1112   Antibiotics Given (last 72 hours)   Date/Time Action Medication Dose Rate   02/13/14  1445 Given   piperacillin-tazobactam (ZOSYN) IVPB 3.375 g 3.375 g 12.5 mL/hr   02/13/14 2202 Given   piperacillin-tazobactam (ZOSYN) IVPB 3.375 g 3.375 g 12.5 mL/hr   02/14/14 0549 Given   piperacillin-tazobactam (ZOSYN) IVPB 3.375 g 3.375 g 12.5 mL/hr   02/14/14 1336 Given   piperacillin-tazobactam (ZOSYN) IVPB 3.375 g 3.375 g 12.5 mL/hr   02/14/14 2159 Given   piperacillin-tazobactam (ZOSYN) IVPB 3.375 g 3.375 g 12.5 mL/hr   02/15/14 8099 Given   piperacillin-tazobactam (ZOSYN) IVPB 3.375 g 3.375 g 12.5 mL/hr   02/15/14 1357 Given   piperacillin-tazobactam (ZOSYN) IVPB 3.375 g 3.375 g 12.5 mL/hr      Active Problems:   Type II or unspecified type diabetes mellitus with unspecified complication, uncontrolled   Sepsis   CKD (chronic kidney disease), stage III   Hypokalemia   Diabetic leg ulcer   Paroxysmal atrial fibrillation   Morbid obesity- BMI 49   Adverse drug reaction   Fever   Leukocytosis   Elevated troponin    Time spent: 25 min    [facility] Pager [phone]. If 7PM-7AM, please contact night-coverage at [email or web address], password [facility] 02/16/2014, 11:51 AM  LOS: 3 days

## 2014-02-16 NOTE — Progress Notes (Signed)
Subjective: No CP or SOB.  She did not have CP even when in RVR.  Objective: Vital signs in last 24 hours: Temp:  [98.4 F (36.9 C)-98.5 F (36.9 C)] 98.5 F (36.9 C) (06/08 0458) Pulse Rate:  [61-67] 67 (06/08 0458) Resp:  [17-19] 17 (06/08 0458) BP: (102-110)/(39-68) 102/39 mmHg (06/08 0458) SpO2:  [96 %-99 %] 96 % (06/08 0458) Weight:  [302 lb 9.6 oz (137.258 kg)] 302 lb 9.6 oz (137.258 kg) (06/07 1100) Last BM Date: 02/15/14  Intake/Output from previous day: 06/07 0701 - 06/08 0700 In: 960 [P.O.:960] Out: -  Intake/Output this shift:    Medications Current Facility-Administered Medications  Medication Dose Route Frequency Provider Last Rate Last Dose  . 0.9 %  sodium chloride infusion   Intravenous Continuous Geradine Girt, DO 50 mL/hr at 02/15/14 0830    . acetaminophen (TYLENOL) tablet 650 mg  650 mg Oral Q6H PRN Geradine Girt, DO   650 mg at 02/15/14 2244  . acidophilus (RISAQUAD) capsule 2 capsule  2 capsule Oral BID Geradine Girt, DO   2 capsule at 02/15/14 2202  . heparin injection 5,000 Units  5,000 Units Subcutaneous 3 times per day Geradine Girt, DO   5,000 Units at 02/16/14 0630  . hydrOXYzine (ATARAX/VISTARIL) tablet 50 mg  50 mg Oral TID PRN Geradine Girt, DO   50 mg at 02/15/14 2240  . insulin aspart (novoLOG) injection 0-15 Units  0-15 Units Subcutaneous TID WC Geradine Girt, DO   2 Units at 02/16/14 0630  . insulin aspart (novoLOG) injection 0-5 Units  0-5 Units Subcutaneous QHS Geradine Girt, DO   3 Units at 02/13/14 2208  . insulin aspart protamine- aspart (NOVOLOG MIX 70/30) injection 65 Units  65 Units Subcutaneous BID WC Geradine Girt, DO   65 Units at 02/16/14 0809  . iron polysaccharides (NIFEREX) capsule 150 mg  150 mg Oral BID Geradine Girt, DO   150 mg at 02/15/14 2201  . latanoprost (XALATAN) 0.005 % ophthalmic solution 1 drop  1 drop Both Eyes QHS Geradine Girt, DO   1 drop at 02/15/14 2203  . loperamide (IMODIUM) capsule 2 mg  2 mg  Oral PRN Geradine Girt, DO   2 mg at 02/15/14 2240  . metoprolol (LOPRESSOR) injection 5 mg  5 mg Intravenous Q2H PRN Pixie Casino, MD      . ondansetron [facility]) tablet 4 mg  4 mg Oral Q6H PRN Geradine Girt, DO       Or  . ondansetron (ZOFRAN) injection 4 mg  4 mg Intravenous Q6H PRN Geradine Girt, DO      . potassium chloride (K-DUR,KLOR-CON) CR tablet 10 mEq  10 mEq Oral BID Geradine Girt, DO   10 mEq at 02/15/14 2201  . sodium chloride 0.9 % injection 3 mL  3 mL Intravenous Q12H Geradine Girt, DO   3 mL at 02/15/14 2202  . timolol (TIMOPTIC) 0.5 % ophthalmic solution 1 drop  1 drop Both Eyes Daily Geradine Girt, DO   1 drop at 02/15/14 1000  . traMADol (ULTRAM) tablet 50 mg  50 mg Oral Q6H PRN Geradine Girt, DO   50 mg at 02/14/14 1819    PE: General appearance: alert, cooperative and no distress Lungs: clear to auscultation bilaterally Heart: regular rate and rhythm, S1, S2 normal, no murmur, click, rub or gallop Extremities: No LEE Pulses: 2+ radials Skin:  Warm and dry.  Both lower extremities were wrapped. Neurologic: Grossly normal  Lab Results:   Recent Labs  02/14/14 0330 02/15/14 0325 02/16/14 0450  WBC 8.9 6.6 4.7  HGB 8.9* 8.4* 8.6*  HCT 26.7* 26.0* 25.8*  PLT 142* 156 154   BMET  Recent Labs  02/14/14 0330 02/15/14 0325 02/16/14 0450  NA 134* 136* 136*  K 4.0 3.9 4.1  CL 96 98 98  CO2 24 23 20   GLUCOSE 240* 113* 142*  BUN 43* 51* 44*  CREATININE 2.23* 2.76* 2.45*  CALCIUM 8.9 9.1 9.3    Assessment/Plan 68 y.o. female with a past medical history significant for IDDM with neuropathy and vasculopathy, venous stasis ulcers with chronic infection, anemia, morbid obesity and family history of heart disease in both parents. She presents with fever, shaking chills and confusion.  She apparently had nausea and vomiting after dinner the other night. She was recently admitted for pseudomonal infection - seen in consultation by Dr. Meda Coffee for new onset  atrial fibrillation in early May of 2015. She had an echocardiogram on 01/16/2014 for PAF which showed EF 55-60%, restrictive diastolic physiology, moderate LAE, mild RAE and high PA pressure of 58 mmHg. On presentation to the ER during this hospitalization, she was found again to be in a-fib with RVR in the 110-120 range - apps. She was not placed on anticoagulation due to chronic anemia and concern for slow bleeding. She was also taken off aspirin. Labs now indicate a leukocytosis with left shift. Cardiology is asked to consult on management of a-fib.   Active Problems:   PAF She is currently in SR with frequent PACs and occasional junctional rhythm.  Needs ischemic workup.  Per Dr. Radford Pax can be done at OP.  The concern would be if a LHC is needed with a SCr >2.   BP is well controlled and a little hypotensive.  EF 55-60%.  No WMA noted but not excluded.    Peak PA pressure 39mmHg.   In the absence of any ischemic symptoms, I would hold off on a NST.   She has a large ulcer on the right LE and dual antiplatelet therapy would not be appropriate.  I would reevaluate in a couple months.  Unfortunately she is not on a medical therapy and her BP will not support any now.  Allergy to lipitor.  Anticoagulation needs to be considered in the future as well.    Elevated troponin Peaked at 0.45.  Probably elevated from demand isch in the setting of RVR and anemia. Hgb currently 8.6.     Type II or unspecified type diabetes mellitus with unspecified complication, uncontrolled  A1C 6.3    CKD (chronic kidney disease), stage III  [place] 2.45      Hypokalemia: WNL   Sepsis    Diabetic leg ulcer   Morbid obesity- BMI 49   Adverse drug reaction   Fever   Leukocytosis     LOS: 3 days    Tarri Fuller PA-C 02/16/2014 8:20 AM    Patient seen and examined. Agree with assessment and plan. I/O +2120 but was dry. Cr slightly improved today from 2.76 to 2.45. Maintaining sinus rhythm. Will add bystolic initially at  2.5 mg and titrate depending upon HR and BP response.   Troy Sine, MD, [facility] 02/16/2014 11:20 AM

## 2014-02-16 NOTE — Progress Notes (Signed)
Agree with the above note 

## 2014-02-17 DIAGNOSIS — IMO0002 Reserved for concepts with insufficient information to code with codable children: Secondary | ICD-10-CM

## 2014-02-17 DIAGNOSIS — I498 Other specified cardiac arrhythmias: Secondary | ICD-10-CM

## 2014-02-17 DIAGNOSIS — E1165 Type 2 diabetes mellitus with hyperglycemia: Secondary | ICD-10-CM

## 2014-02-17 DIAGNOSIS — E118 Type 2 diabetes mellitus with unspecified complications: Secondary | ICD-10-CM

## 2014-02-17 LAB — CBC
HEMATOCRIT: 27 % — AB (ref 36.0–46.0)
HEMOGLOBIN: 8.8 g/dL — AB (ref 12.0–15.0)
MCH: 28.5 pg (ref 26.0–34.0)
MCHC: 32.6 g/dL (ref 30.0–36.0)
MCV: 87.4 fL (ref 78.0–100.0)
Platelets: 169 10*3/uL (ref 150–400)
RBC: 3.09 MIL/uL — ABNORMAL LOW (ref 3.87–5.11)
RDW: 16.3 % — ABNORMAL HIGH (ref 11.5–15.5)
WBC: 4.8 10*3/uL (ref 4.0–10.5)

## 2014-02-17 LAB — BASIC METABOLIC PANEL
BUN: 37 mg/dL — AB (ref 6–23)
CO2: 22 mEq/L (ref 19–32)
CREATININE: 2.14 mg/dL — AB (ref 0.50–1.10)
Calcium: 9.4 mg/dL (ref 8.4–10.5)
Chloride: 97 mEq/L (ref 96–112)
GFR calc non Af Amer: 22 mL/min — ABNORMAL LOW (ref >90)
GFR, EST AFRICAN AMERICAN: 26 mL/min — AB (ref >90)
Glucose, Bld: 187 mg/dL — ABNORMAL HIGH (ref 70–99)
Potassium: 3.9 mEq/L (ref 3.7–5.3)
Sodium: 135 mEq/L — ABNORMAL LOW (ref 137–147)

## 2014-02-17 LAB — GLUCOSE, CAPILLARY
Glucose-Capillary: 165 mg/dL — ABNORMAL HIGH (ref 70–99)
Glucose-Capillary: 169 mg/dL — ABNORMAL HIGH (ref 70–99)
Glucose-Capillary: 89 mg/dL (ref 70–99)

## 2014-02-17 MED ORDER — DIPHENOXYLATE-ATROPINE 2.5-0.025 MG PO TABS: 1.0000 | ORAL_TABLET | Freq: Four times a day (QID) | ORAL | Status: DC | PRN

## 2014-02-17 NOTE — Progress Notes (Signed)
Wound Care follow up for Dr. Migdalia Dk Subjective: Patient reports she is feeling better.  The dressing to the left lower leg was removed and the ulcer was cleaned with NS and then  Acell 500 mg powder and a 7 x 10 thin burn sheet was applied after it was hydrated with saline.  Then adaptic was applied to the area and surgical lubricant and the VAC dressing was applied and set at 125 mmHg continuous therapy.  The dressing should not be changed until the patient follows up in [facility] next Monday with Dr. Migdalia Dk following the application of Acell.   Objective: Vital signs in last 24 hours: Temp:  [96.7 F (35.9 C)-98.2 F (36.8 C)] 97.7 F (36.5 C) (06/09 0631) Pulse Rate:  [50-86] 86 (06/09 0631) Resp:  [18-22] 18 (06/09 0631) BP: (122-144)/(46-68) 129/46 mmHg (06/09 0631) SpO2:  [96 %-98 %] 97 % (06/09 0631) Last BM Date: 02/16/14  Intake/Output from previous day: 06/08 0701 - 06/09 0700 In: 720 [P.O.:720] Out: -  Intake/Output this shift:     Lab Results:   Recent Labs  02/16/14 0450 02/17/14 0344  WBC 4.7 4.8  HGB 8.6* 8.8*  HCT 25.8* 27.0*  PLT 154 169   BMET  Recent Labs  02/16/14 0450 02/17/14 0344  NA 136* 135*  K 4.1 3.9  CL 98 97  CO2 20 22  GLUCOSE 142* 187*  BUN 44* 37*  CREATININE 2.45* 2.14*  CALCIUM 9.3 9.4   PT/INR No results found for this basename: LABPROT, INR,  in the last 72 hours ABG No results found for this basename: PHART, PCO2, PO2, HCO3,  in the last 72 hours  Studies/Results: No results found.  Anti-infectives: Anti-infectives   Start     Dose/Rate Route Frequency Ordered Stop   02/13/14 1400  piperacillin-tazobactam (ZOSYN) IVPB 3.375 g  Status:  Discontinued     3.375 g 12.5 mL/hr over 240 Minutes Intravenous Every 8 hours 02/13/14 1341 02/15/14 1839   02/13/14 0700  piperacillin-tazobactam (ZOSYN) IVPB 3.375 g     3.375 g 100 mL/hr over 30 Minutes Intravenous  Once 02/13/14 0650 02/13/14 0813       Assessment/Plan: s/p * No surgery found * Patient can discharge from wound care standpoint  LOS: 4 days    Leroy Pettway,PA-C Plastic Surgery [phone]

## 2014-02-17 NOTE — Progress Notes (Signed)
Patient: [name] / Admit Date: 02/13/2014 / Date of Encounter: 02/17/2014, 8:52 AM  Subjective  No acute complaints. Leg is feeling better after new wrapping.  Objective   Telemetry: NSR/PACs with intermittent brief junctional HR upper 30s while sleeping  Physical Exam: Blood pressure 129/46, pulse 86, temperature 97.7 F (36.5 C), temperature source Oral, resp. rate 18, height 5\' 6"  (1.676 m), weight 302 lb 9.6 oz (137.258 kg), SpO2 97.00%. General: Well developed, well nourished obese WF in no acute distress, scattered diffuse reticular drug rash Head: Normocephalic, atraumatic, sclera non-icteric, no xanthomas, nares are without discharge. Neck: JVP not elevated. Lungs: Clear bilaterally to auscultation without wheezes, rales, or rhonchi. Breathing is unlabored. Heart: RRR occ ectopy S1 S2 without murmurs, rubs, or gallops.  Abdomen: Soft, non-tender, non-distended with normoactive bowel sounds. No rebound/guarding. Extremities: No clubbing or cyanosis. BLE wrapped. Neuro: Alert and oriented X 3. Moves all extremities spontaneously. Skin: diffuse reticular drug rash on arms Psych:  Responds to questions appropriately with a normal affect.   Intake/Output Summary (Last 24 hours) at 02/17/14 0852 Last data filed at 02/17/14 0700  Gross per 24 hour  Intake   1080 ml  Output      0 ml  Net   1080 ml    Inpatient Medications:  . acidophilus  2 capsule Oral BID  . heparin  5,000 Units Subcutaneous 3 times per day  . insulin aspart  0-15 Units Subcutaneous TID WC  . insulin aspart  0-5 Units Subcutaneous QHS  . insulin aspart protamine- aspart  65 Units Subcutaneous BID WC  . iron polysaccharides  150 mg Oral BID  . latanoprost  1 drop Both Eyes QHS  . nebivolol  2.5 mg Oral Daily  . potassium chloride  10 mEq Oral BID  . sodium chloride  3 mL Intravenous Q12H  . timolol  1 drop Both Eyes Daily   Infusions:    Labs:  Recent Labs  02/16/14 0450 02/17/14 0344  NA  136* 135*  K 4.1 3.9  CL 98 97  CO2 20 22  GLUCOSE 142* 187*  BUN 44* 37*  CREATININE 2.45* 2.14*  CALCIUM 9.3 9.4     Recent Labs  02/16/14 0450 02/17/14 0344  WBC 4.7 4.8  HGB 8.6* 8.8*  HCT 25.8* 27.0*  MCV 88.4 87.4  PLT 154 169    Recent Labs  02/14/14 0902 02/14/14 1248 02/14/14 2025  TROPONINI 0.45* 0.39* 0.35*   No components found with this basename: POCBNP,  No results found for this basename: HGBA1C,  in the last 72 hours   Radiology/Studies:  Dg Chest Port 1 View  02/13/2014   CLINICAL DATA:  fever  EXAM: PORTABLE CHEST - 1 VIEW  COMPARISON:  Prior radiograph from 02/11/2014  FINDINGS: Right-sided thyroid line has been removed. Cardiomegaly is stable. Mediastinal silhouette within normal limits.  Lungs are normally inflated. There is diffuse peribronchial thickening, which may reflect active bronchiolitis given the history of fever. No consolidative airspace disease. No pulmonary edema or pleural effusion. No pneumothorax.  Osseous structures are unchanged with no acute osseous abnormality identified.  IMPRESSION: 1. Diffuse peribronchial thickening, which may reflect bronchiolitis in the setting of fever. No consolidative airspace disease identified. 2. Stable cardiomegaly without pulmonary edema.   Electronically Signed   By: Jeannine Boga M.D.   On: 02/13/2014 06:38     Assessment and Plan  1. Possible sepsis (adm with fever, chills, confusion) - per IM. 2. Chronic venous stasis ulcers  with chonic infection including pseudomonas s/p prior I&D/wound vac/abx with subsequent drug rash - seen by plastics/ID. 3. Paroxysmal atrial fibrillation, dx May 2015 - she is in SR with freq PAC sand occasional junctional rhythm. BP will not support titration of meds. With regard to anticoagulation, although CHADSVASC score is 3, when Dr. Meda Coffee saw her last month she advised against anticoagulation as currently her risks outweigh her benefits given severe anemia, possible  bleeding from her wound, and risk for fall. We may just have to defer this issue to the outpatient setting as her situation improves - will clarify with MD. Continue newly started Bystolic at present dose - would not titrate further due to bradycardia overnight. 4. Elevated troponin - needs ischemic workup and per prior notes, can be done as an outpatient. Do not feel this represents NSTEMI - may be due to RVR and CKD. 2D Echo 01/2014 showing EF 55-60% without WMA but could not be excluded, PA pressure 41mmHg. The concern would be if a LHC is needed with a SCr >2. In the absence of any ischemic symptoms, we will hold off stress testing for now and re-evaluate for symptoms as outpatient. She has a large ulcer on the right LE and dual antiplatelet therapy would not be appropriate. H/o myalgias with statin in the past.  5. 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 6. Acute on CKD stage III - Cr improving after holding of diuretics (HCTZ, Bumex). 7. Anemia - see above. Further workup per IM. May need something like Aranesp. 8. Morbid obesity Body mass index is 48.86 kg/(m^2). - advised she discuss sleep study with PCP to exclude sleep apnea. Suspect this in setting of AF, elevated PA pressures and morbid obesity.  Dispo: Has f/u with Dr. Meda Coffee 6/19 at 12pm. Pt is under impression she is nearing DC.  Signed, Melina Copa PA-C   Patient seen and examined. Agree with assessment and plan. Cr improved today to 2.14.HR now bradycardic in 50's with occasional pauses and junctional beat; will dc bystolic.    Troy Sine, MD, [facility] 02/17/2014 11:42 AM

## 2014-02-17 NOTE — Progress Notes (Signed)
Agree with the above

## 2014-02-17 NOTE — Discharge Summary (Signed)
Physician Discharge Summary  [name] WJX:[id number] DOB: 1945-09-17 DOA: 02/13/2014  PCP: Shirline Frees, MD  Admit date: 02/13/2014 Discharge date: 02/17/2014  Time spent: greater than 30 min  Recommendations for Outpatient Follow-up:  Needs an ischemic w/u at some point given diabetes. Could be done as OP.  Continue followup at wound care clinic. Monitor BMET  Discharge Diagnoses:  Active Problems:   Type II or unspecified type diabetes mellitus with unspecified complication, uncontrolled   Sepsis   CKD (chronic kidney disease), stage III   Hypokalemia   Anemia   Diabetic leg ulcer   Paroxysmal atrial fibrillation   Morbid obesity- BMI 49   Adverse drug reaction   Fever   Leukocytosis   Elevated troponin   Discharge Condition: stable  Filed Weights   02/15/14 1100  Weight: 137.258 kg (302 lb 9.6 oz)    History of present illness:  68 y.o. female  with DM2, morbid obesity (BMI 49), chronic LLE ulcer. Prev pseudomonas (R- aztreonam, cipro). She was admtted 5-3 to 5-7 with worsening of this ulcer, sepsis. She was evaluated by [facility] and plastics and had I &D, placement of VAC on 5-6. She was d/c home with vanco/cefepime. By 5-13 she had developed a diffuse erythematous rash on her trunk. Abx were stopped and she was referred to derm. Seen by ID 5/27. Wound vac was recently removed.  She come to the ER today, with the complaint of fever, shaking chills, confusion. Family states this is similar presentation that she had last month prior to debridement of any ulcer on her left leg. Patient has been doing well at home. Wound VAC was removed on Monday to let the area rest.  In the Er, her mental status was improved and she was able to give the history, she also went into a fib with RVR. No chest pain, no SOB. Pressure borderline low. hospitalist were asked to admit   Hospital Course:  Admitted to the hospitalist service with concerns of sepsis. Started on broad-spectrum  antibiotics, Zosyn. Infectious disease was consulted. Cardiology was consulted.  Sepsis- blood cultures NGTD- infectious disease stopped antibiotics after cultures negative and recommended no further antibiotics at home.  Chronic stasis ulcer--- seen by plastic surgery who had been following the ulceration. Ordered dressings while hospitalized then wound VAC was replaced at discharge.  Hypotension- patient was fluid resuscitated, and diuretics were held.  Morbid obesity- needs outpatient polysomnogram.  Anemia- on FE-   A fib with RVR- cardiology recommended low-dose beta blocker as blood pressure would tolerate.  Felt that episode was  related to acute illness. Converted to normal sinus rhythm.  Troponin increased slightly from demand ischemia.   Hypokalemia- corrected  AKI on CKD- patient was given IV hydration and diuretics were held. Patient would like to resume hydrochlorothiazide and felt that many of her problems were worsened by Bumex. She will need an outpatient basic metabolic panel and adjustment in diuretics if needed. She reports she gets severe edema without diuretic.  Procedures:  None  Consultations:  Cardiology  Plastic surgery  Discharge Exam: Filed Vitals:   02/17/14 1405  BP: 131/52  Pulse: 52  Temp: 98.6 F (37 C)  Resp: 20    General: Talkative. Nontoxic. Alert and oriented. Cardiovascular: Regular rate and rhythm without murmurs gallops rubs Respiratory: Clear to auscultation bilaterally without wheezes rhonchi or rales Obese, soft, nontender Extremities: Wound VAC in place.  Discharge Instructions   Diet - low sodium heart healthy    Complete by:  As directed      Diet Carb Modified    Complete by:  As directed      Increase activity slowly    Complete by:  As directed             Medication List    STOP taking these medications       bumetanide 2 MG tablet  Commonly known as:  BUMEX     COD LIVER OIL PO     famotidine 40 MG  tablet  Commonly known as:  PEPCID     predniSONE 10 MG tablet  Commonly known as:  STERAPRED UNI-PAK      TAKE these medications       diphenoxylate-atropine 2.5-0.025 MG per tablet  Commonly known as:  LOMOTIL  Take 1 tablet by mouth 4 (four) times daily as needed for diarrhea or loose stools.     glipiZIDE 10 MG tablet  Commonly known as:  GLUCOTROL  Take 10 mg by mouth daily before breakfast.     hydrochlorothiazide 25 MG tablet  Commonly known as:  HYDRODIURIL  Take 25 mg by mouth daily.     hydrOXYzine 50 MG tablet  Commonly known as:  ATARAX/VISTARIL  Take 50 mg by mouth 3 (three) times daily as needed for itching.     insulin NPH-regular Human (70-30) 100 UNIT/ML injection  Commonly known as:  NOVOLIN 70/30  Inject 80 Units into the skin 2 (two) times daily with a meal.     insulin regular 100 units/mL injection  Commonly known as:  NOVOLIN R,HUMULIN R  Inject 0-20 Units into the skin daily as needed for high blood sugar. Sliding scale     iron polysaccharides 150 MG capsule  Commonly known as:  NIFEREX  Take 150 mg by mouth 2 (two) times daily.     mupirocin ointment 2 %  Commonly known as:  BACTROBAN  Apply 1 application topically daily.     OVER THE COUNTER MEDICATION  Take 1 tablet by mouth 2 (two) times daily. Clorellea     potassium chloride 10 MEQ tablet  Commonly known as:  K-DUR,KLOR-CON  Take 10 mEq by mouth 2 (two) times daily.     timolol 0.5 % ophthalmic solution  Commonly known as:  TIMOPTIC  Place 1 drop into both eyes daily.     traMADol 50 MG tablet  Commonly known as:  ULTRAM  Take 50 mg by mouth every 6 (six) hours as needed for moderate pain.     vitamin C 500 MG tablet  Commonly known as:  ASCORBIC ACID  Take 500 mg by mouth 2 (two) times daily.       Allergies  Allergen Reactions  . Bactrim [Sulfamethoxazole-Tmp Ds] Shortness Of Breath    Depression based reactions, altered mentality  . Atorvastatin Other (See Comments)     aches  . Sulfamethoxazole-Trimethoprim Swelling  . Vancomycin     Rash, swelling , shortness of breath .  Marland Kitchen Actos [Pioglitazone Hydrochloride] Other (See Comments)    unknown  . Celebrex [Celecoxib] Other (See Comments) and Rash    unknown  . Ibuprofen Other (See Comments) and Rash  . Latex Rash  . Neosporin [Neomycin-Bacitracin Zn-Polymyx] Rash  . Pioglitazone Rash       Follow-up Information   Follow up with [facility]              On 02/23/2014. (appointment for 11:00 am as previously scheduled per  patient)    Contact information:   [address] [place] [address] [phone]      Follow up with Shirline Frees, MD. (to check kidney function)    Specialty:  Family Medicine   Contact information:   [place] [address] [phone]        The results of significant diagnostics from this hospitalization (including imaging, microbiology, ancillary and laboratory) are listed below for reference.    Significant Diagnostic Studies: Dg Chest Port 1 View  02/13/2014   CLINICAL DATA:  fever  EXAM: PORTABLE CHEST - 1 VIEW  COMPARISON:  Prior radiograph from 02/11/2014  FINDINGS: Right-sided thyroid line has been removed. Cardiomegaly is stable. Mediastinal silhouette within normal limits.  Lungs are normally inflated. There is diffuse peribronchial thickening, which may reflect active bronchiolitis given the history of fever. No consolidative airspace disease. No pulmonary edema or pleural effusion. No pneumothorax.  Osseous structures are unchanged with no acute osseous abnormality identified.  IMPRESSION: 1. Diffuse peribronchial thickening, which may reflect bronchiolitis in the setting of fever. No consolidative airspace disease identified. 2. Stable cardiomegaly without pulmonary edema.   Electronically Signed   By: Jeannine Boga M.D.   On: 02/13/2014 06:38    Microbiology: Recent Results  (from the past 240 hour(s))  CULTURE, BLOOD (ROUTINE X 2)     Status: None   Collection Time    02/13/14  3:55 AM      Result Value Ref Range Status   Specimen Description BLOOD LEFT ARM   Final   Special Requests BOTTLES DRAWN AEROBIC AND ANAEROBIC 10CC EACH   Final   Culture  Setup Time     Final   Value: 02/13/2014 08:27     Performed at [facility]   Culture     Final   Value:        BLOOD CULTURE RECEIVED NO GROWTH TO DATE CULTURE WILL BE HELD FOR 5 DAYS BEFORE ISSUING A FINAL NEGATIVE REPORT     Performed at [facility]   Report Status PENDING   Incomplete  CULTURE, BLOOD (ROUTINE X 2)     Status: None   Collection Time    02/13/14  4:02 AM      Result Value Ref Range Status   Specimen Description BLOOD RIGHT ARM   Final   Special Requests BOTTLES DRAWN AEROBIC ONLY 10CC   Final   Culture  Setup Time     Final   Value: 02/13/2014 08:28     Performed at [facility]   Culture     Final   Value:        BLOOD CULTURE RECEIVED NO GROWTH TO DATE CULTURE WILL BE HELD FOR 5 DAYS BEFORE ISSUING A FINAL NEGATIVE REPORT     Performed at [facility]   Report Status PENDING   Incomplete  URINE CULTURE     Status: None   Collection Time    02/13/14  4:50 AM      Result Value Ref Range Status   Specimen Description URINE, CATHETERIZED   Final   Special Requests NONE   Final   Culture  Setup Time     Final   Value: 02/13/2014 05:16     Performed at [place]     Final   Value: NO GROWTH     Performed at [facility]  Final   Value: NO GROWTH     Performed at [facility]   Report Status 02/14/2014 FINAL   Final  MRSA PCR SCREENING     Status: None   Collection Time    02/13/14 12:41 PM      Result Value Ref Range Status   MRSA by PCR NEGATIVE  NEGATIVE Final   Comment:            The GeneXpert MRSA Assay (FDA     approved for NASAL specimens     only), is one component of a      comprehensive MRSA colonization     surveillance program. It is not     intended to diagnose MRSA     infection nor to guide or     monitor treatment for     MRSA infections.     Labs: Basic Metabolic Panel:  Recent Labs Lab 02/13/14 0335 02/14/14 0330 02/15/14 0325 02/16/14 0450 02/17/14 0344  NA 138 134* 136* 136* 135*  K 3.1* 4.0 3.9 4.1 3.9  CL 95* 96 98 98 97  CO2 25 24 23 20 22   GLUCOSE 71 240* 113* 142* 187*  BUN 33* 43* 51* 44* 37*  CREATININE 1.74* 2.23* 2.76* 2.45* 2.14*  CALCIUM 9.9 8.9 9.1 9.3 9.4   Liver Function Tests:  Recent Labs Lab 02/13/14 0333  AST 29  ALT 26  ALKPHOS 102  BILITOT 2.1*  PROT 8.5*  ALBUMIN 4.1   No results found for this basename: LIPASE, AMYLASE,  in the last 168 hours No results found for this basename: AMMONIA,  in the last 168 hours CBC:  Recent Labs Lab 02/13/14 0335 02/14/14 0330 02/15/14 0325 02/16/14 0450 02/17/14 0344  WBC 15.9* 8.9 6.6 4.7 4.8  NEUTROABS 14.3*  --   --   --   --   HGB 12.0 8.9* 8.4* 8.6* 8.8*  HCT 36.4 26.7* 26.0* 25.8* 27.0*  MCV 88.6 87.0 87.5 88.4 87.4  PLT 206 142* 156 154 169   Cardiac Enzymes:  Recent Labs Lab 02/13/14 1938 02/14/14 0110 02/14/14 0902 02/14/14 1248 02/14/14 2025  TROPONINI <0.30 0.35* 0.45* 0.39* 0.35*   BNP: BNP (last 3 results) No results found for this basename: PROBNP,  in the last 8760 hours CBG:  Recent Labs Lab 02/16/14 1630 02/16/14 2048 02/17/14 0011 02/17/14 0628 02/17/14 1152  GLUCAP 198* 107* 89 165* 169*   Signed:  Delfina Redwood  [facility] 02/17/2014, 2:14 PM

## 2014-02-17 NOTE — Progress Notes (Signed)
Assessment unchanged. Discussed D/C instructions with pt including f/u appointments and new medications. Verbalized understanding. RX given to pt. IV and tele removed. Pt left with belongings accompanied by Volunteer. 

## 2014-02-17 NOTE — Care Management Note (Signed)
    Page 1 of 1   02/17/2014     3:51:18 PM CARE MANAGEMENT NOTE 02/17/2014  Patient:  [name], [name]   Account Number:  [id number]  Date Initiated:  02/17/2014  Documentation initiated by:  Bryann Gentz  Subjective/Objective Assessment:   Pt adm on 02/13/14 with possible sepsis; lt leg ulcer.  PTA, pt independent, lives with spouse.     Action/Plan:   Pt for dc home today with home VAC.  Pt had home VAC PTA; [facility] in [place] providng HH care for wound VAC prior to admit.  Pt wishes to resume home care with this agency.   Anticipated DC Date:  02/17/2014   Anticipated DC Plan:  [place]  CM consult      [facility] Choice  HOME HEALTH   Choice offered to / List presented to:  C-1 Patient        [facility] arranged  HH-1 RN      [facility] agency  OTHER - SEE NOTE   Status of service:  Completed, signed off Medicare Important Message given?  YES (If response is "NO", the following Medicare IM given date fields will be blank) Date Medicare IM given:   Date Additional Medicare IM given:  02/17/2014  Discharge Disposition:  [place]  Per UR Regulation:  Reviewed for med. necessity/level of care/duration of stay  If discussed at [place] of Stay Meetings, dates discussed:    Comments:  Ellan Lambert, RN, BSN [phone] Pt for dc home today; faxed orders, notes to [facility]. (fax [phone]).  Phone # [phone].  Spoke with [place]; [facility] agency to follow up with pt at next drsg change.

## 2014-02-18 ENCOUNTER — Ambulatory Visit: Payer: Medicare Other | Admitting: Cardiology

## 2014-02-19 LAB — CULTURE, BLOOD (ROUTINE X 2)
CULTURE: NO GROWTH
CULTURE: NO GROWTH

## 2014-02-24 NOTE — Progress Notes (Signed)
[facility]  NAME:  [name], [name]               ACCOUNT NO.:  [id number]  MEDICAL RECORD NO.:  [id number]      DATE OF BIRTH:  [birth year]  PHYSICIAN:  Theodoro Kos, DO       VISIT DATE:  02/23/2014                                  OFFICE VISIT   HISTORY:  The patient is a 68 year old female who is here for followup on her left lower extremity ulcer.  She has some swelling today.  She has been in the hospital and being treated for chronic venous insufficiency and diabetic ulcer.  She had debridement with ACell placement.  Overall, she is doing better than she had been in the hospital.  MEDICATIONS:  There has been no significant change in her medications.  REVIEW OF SYSTEMS:  Otherwise, negative.  PHYSICAL EXAMINATION:  GENERAL:  On exam, she is alert, oriented, cooperative, not in any acute distress. HEENT:  Pupils equal.  Extraocular muscles are intact. RESPIRATORY:  Her breathing is unlabored. CARDIOVASCULAR:  Heart rate is regular.  She has been using the [facility] and my recommendation is to continue.  We will put an ACell on today, but hopefully we will be able to add some next week and she would like [facility] and do VAC changes twice a week with them and once a week with [place].     Theodoro Kos, DO     CS/MEDQ  D:  02/23/2014  T:  02/24/2014  Job:  [id number]

## 2014-02-27 ENCOUNTER — Ambulatory Visit: Payer: Medicare Other | Admitting: Cardiology

## 2014-03-03 ENCOUNTER — Ambulatory Visit: Payer: Medicare Other | Admitting: Infectious Diseases

## 2014-03-03 NOTE — Progress Notes (Signed)
[facility]  NAME:  [name], [name]               ACCOUNT NO.:  [id number]  MEDICAL RECORD NO.:  [id number]      DATE OF BIRTH:  [birth year]  PHYSICIAN:  Theodoro Kos, DO       VISIT DATE:  03/02/2014                                  OFFICE VISIT   HISTORY OF PRESENT ILLNESS:  The patient is a 68 year old female who is here for followup on her left lower extremity chronic venous insufficiency, lymphedema, ulcerations.  She underwent debridement with ACell placement and VAC in the OR.  She had a readmission secondary to low hemoglobin.  Overall, she is doing much better and is back at home.  PHYSICAL EXAMINATION:  She is alert, oriented, cooperative, not in any distress.  She is pleasant, aware of her condition.  Her breathing is unlabored.  Her heart rate is regular.  The wound, she has got a fair bit of lymphedema drainage, but overall the wound is improving and accepting the ACell.  She had a little bit more placed in the hospital and that is looking good.  We will continue with the VAC.  I added an Endoform and we will continue with the Profore.  We will see her back in 1 week.  She will continue with dressing changes at home.     Theodoro Kos, DO     CS/MEDQ  D:  03/02/2014  T:  03/03/2014  Job:  [id number]

## 2014-03-10 NOTE — Progress Notes (Signed)
[facility]  NAMESHARITA, [name]               ACCOUNT NO.:  [id number]  MEDICAL RECORD NO.:  [id number]      DATE OF BIRTH:  [birth year]  PHYSICIAN:  Irene Limbo, MD    VISIT DATE:  03/09/2014                                  OFFICE VISIT   [name] is here for followup of chronic left lower extremity ulceration in the setting of obesity, venous stasis, and lymphedema. The patient was recently hospitalized for her anemia.  She had additional ACell placed at that visit.  Her last visit with Dr. Migdalia Dk placed Endoform over the wound.  Her current wound care has been wound VAC with Profore compression.  She reports that after her last visit, the layered compression wrap came down nearly the same day and she had to cut the Coban layer off.  She also reports that she was instructed to decrease her diuretic every other day, and notes significant increase in her swelling over bilateral extremities.  With this, she is planning to resume to daily use.  She does have a lymphedema pump at home which she has not used.  There is some concern for greenish drainage coming out of her wound VAC.  EXAMINATION:  VITAL SIGNS:  Blood pressure is 141/78, blood glucose is 105, pulse is 73, temperature is 97.7.  Weight is 317 pounds. EXTREMITIES:  She has an open wound over her left lateral lower extremity measured as 9.4 x 11.8 x 0.1 cm.  Left calf circumference is 45 cm, left ankle 29 cm.  There is no cellulitis present.  She does have adherent Endoform which has not dissolved.  This was trimmed as there is concern for colonization of the wound given the drainage color.  We will plan for silver collagen and continue the VAC.  Patient will resume her daily diuretic after discussion with her primary care. She will continue with Profore compression this week, completed 3 times weekly.  She will follow up with Dr. Migdalia Dk or myself in 1 week's time, and I encouraged her to use  her lymphedema pump.          ______________________________ Irene Limbo, MD     BT/MEDQ  D:  03/09/2014  T:  03/10/2014  Job:  [id number]

## 2014-03-16 ENCOUNTER — Encounter (HOSPITAL_BASED_OUTPATIENT_CLINIC_OR_DEPARTMENT_OTHER): Payer: Medicare Other | Attending: Plastic Surgery

## 2014-03-16 DIAGNOSIS — I872 Venous insufficiency (chronic) (peripheral): Secondary | ICD-10-CM | POA: Diagnosis not present

## 2014-03-16 DIAGNOSIS — L97809 Non-pressure chronic ulcer of other part of unspecified lower leg with unspecified severity: Secondary | ICD-10-CM | POA: Insufficient documentation

## 2014-03-16 DIAGNOSIS — I890 Lymphedema, not elsewhere classified: Secondary | ICD-10-CM | POA: Diagnosis not present

## 2014-03-17 NOTE — Progress Notes (Signed)
[facility]  [name], [name]               ACCOUNT NO.:  [id number]  MEDICAL RECORD NO.:  [id number]      DATE OF BIRTH:  Aug 02, 1946  PHYSICIAN:  Irene Limbo, MD    VISIT DATE:  03/16/2014                                  OFFICE VISIT   CHIEF COMPLAINT:  Followup of left lower extremity ulceration.  HISTORY OF PRESENT ILLNESS:  The patient is here for followup of left lower extremity ulceration in the setting of venous stasis and lymphedema.  Since her last visit, she notes that she returned to using lymphedema pump and this has improved her control of edema over right lower extremity; however, she was unable to tolerate more than 1 application to her wounded lower extremity.  She comes today with pictures noting the amount that her layered compression wraps had fallen within 1 day's time after her visit here and states that she applies additional Coban after returning home.  Her current wound care has been silver collagen with wound VAC in Profore compression.  She has had 2 previous applications of ACell by Dr. Migdalia Dk.  PHYSICAL EXAMINATION:  VITAL SIGNS:  Blood pressure is recorded as 161/102, which the patient requests to be repeated.  Pulse is 74, blood glucose is 170, temperature is 97.7.  Left lateral lower extremity wound is measured as 8.7 x 10 x 0.1 cm.  This is improved since her last visit here.  Calf circumferences were not obtained.  After application of topical anesthetic, curette was used to remove all the superficial slough over the entirety of the wound for selective debridement.  The patient tolerated this well.  We will continue with wound VAC and silver collagen and Adaptic beneath Profore layered compression. She will only have one additional change this week as her home health agency is closed over the weekend.  She will follow up in 1 week's time.          ______________________________ Irene Limbo, MD     BT/MEDQ   D:  03/16/2014  T:  03/17/2014  Job:  [id number]

## 2014-03-23 DIAGNOSIS — L97809 Non-pressure chronic ulcer of other part of unspecified lower leg with unspecified severity: Secondary | ICD-10-CM | POA: Diagnosis not present

## 2014-03-23 DIAGNOSIS — I872 Venous insufficiency (chronic) (peripheral): Secondary | ICD-10-CM | POA: Diagnosis not present

## 2014-03-23 DIAGNOSIS — I890 Lymphedema, not elsewhere classified: Secondary | ICD-10-CM | POA: Diagnosis not present

## 2014-03-24 NOTE — Progress Notes (Signed)
[facility]  NAMEASSYRIA, [name]               ACCOUNT NO.:  [id number]  MEDICAL RECORD NO.:  [id number]      DATE OF BIRTH:  Sep 08, 1946  PHYSICIAN:  Irene Limbo, MD    VISIT DATE:  03/23/2014                                  OFFICE VISIT   CHIEF COMPLAINT:  Followup of left lower extremity ulceration.  HISTORY OF PRESENT ILLNESS:  The patient is here for followup of left lower extremity ulceration in the setting of venous stasis and lymphedema.  She is currently using wound VAC with overlying Profore layered wrap.  She has developed secondary ulceration secondary to the wound VAC adhesive over the last week.  PHYSICAL EXAMINATION:  VITAL SIGNS:  Blood pressure is 152/71, pulse is 80, temperature is 98.2, blood sugar is 168.  Left calf circumference is 40 cm.  Left ankle is 29 cm.  Left lateral lower extremity wound is measured at 7.6 x 9.2 x 0.1 cm.  This is actually improved since her last visit here, she has developed 2 new ulcerations under the VAC tape. Left anterior lower extremity is measured at 3.5 x 3.5 x 0.1 cm.  Left lateral proximal lower extremity is measured as 2.3 x 3 x 0.1 cm. Wounds are clean with minimal slough present.  No debridement was performed.  We will hold the VAC at this time.  We will plan for silver alginate, followed by a Profore compression.  I have asked the patient to resume her VAC in 5 day's time with her home health, however, there occurs to call my office directly if they feel that she has been doing well off the wound VAC, and we can continue with the silver alginate. We have discussed previous lymphedema pump use and she states that she has been unable to use it over her wound at lower extremity.  She is using it over her right lower extremity.          ______________________________ Irene Limbo, MD     BT/MEDQ  D:  03/23/2014  T:  03/24/2014  Job:  [id number]

## 2014-03-30 DIAGNOSIS — I872 Venous insufficiency (chronic) (peripheral): Secondary | ICD-10-CM | POA: Diagnosis not present

## 2014-03-30 DIAGNOSIS — I890 Lymphedema, not elsewhere classified: Secondary | ICD-10-CM | POA: Diagnosis not present

## 2014-03-30 DIAGNOSIS — L97809 Non-pressure chronic ulcer of other part of unspecified lower leg with unspecified severity: Secondary | ICD-10-CM | POA: Diagnosis not present

## 2014-03-31 NOTE — Progress Notes (Signed)
[facility]  NAMECULLEN, [name]               ACCOUNT NO.:  [id number]  MEDICAL RECORD NO.:  [id number]      DATE OF BIRTH:  01-27-1946  PHYSICIAN:  Irene Limbo, MD         VISIT DATE:                                  OFFICE VISIT   CHIEF COMPLAINT:  Followup of left lower extremity ulceration.  HISTORY OF PRESENT ILLNESS:  The patient is here for followup of left lower extremity ulceration in the setting of venous stasis, lymphedema, and obesity over the past week.  We have held her wound VAC if she develops some secondary periwound maceration and treating her with silver alginate as well as her layered compression wraps.  She presents today with saturation of the wrap.  She has had 2 prior ACell Applications, the last being over 1 month ago while she was hospitalized for anemia.  PHYSICAL EXAMINATION:  VITAL SIGNS:  Blood pressure is 146/74, blood glucose is 160, pulse is 81, temperature is 97.4. EXTREMITIES:  Over her left lateral lower extremity, the main open wound is measured as 11.5 x 9 x 0.1 cm.  This has increased since her last visit.  There is slough present over the base of the wound.  The 2 periwound macerations labeled left anterior and left lateral proximal wounds are still present and similar in size.  The left anterior lower extremity is measured as 3 x 4.5 x 0.1 cm.  The left lateral proximal extremity is measured as 2.8 x 2.4 x 0.1 cm.  Selective debridement was performed with a curette after application of topical anesthetic to remove all the superficial slough present over the left lateral lower extremity wound.  Left calf circumference is 49 cm.  Left ankle circumference is 31 cm.  ASSESSMENT:  Overall, she has had increase in size in her calf circumferences as well as the size of her main wound since we had held the [facility] back.  We will return to use of the wound VAC.  In addition, we will plan for silver collagen for placement  over all of the wound.  She will continue with a Profore layered compression wrap.  We will plan for followup in 1 week's time.  Discussed repeat ACell application.  She will be reassessed by Dr. Migdalia Dk or myself next week and we will plan for possible repeat ACell application as the wound appears to be stalled.          ______________________________ Irene Limbo, MD     BT/MEDQ  D:  03/30/2014  T:  03/31/2014  Job:  [id number]

## 2014-04-06 DIAGNOSIS — I872 Venous insufficiency (chronic) (peripheral): Secondary | ICD-10-CM | POA: Diagnosis not present

## 2014-04-06 DIAGNOSIS — I890 Lymphedema, not elsewhere classified: Secondary | ICD-10-CM | POA: Diagnosis not present

## 2014-04-06 DIAGNOSIS — L97809 Non-pressure chronic ulcer of other part of unspecified lower leg with unspecified severity: Secondary | ICD-10-CM | POA: Diagnosis not present

## 2014-04-07 NOTE — Progress Notes (Signed)
[facility]  NAME:  NATAYA, BASTEDO               ACCOUNT NO.:  [id number]  MEDICAL RECORD NO.:  [id number]      DATE OF BIRTH:  [birth year]  PHYSICIAN:  Theodoro Kos, DO       VISIT DATE:  04/06/2014                                  OFFICE VISIT   The patient is a 68 year old female who is here for followup on her left leg ulcer.  She was admitted and underwent debridement with ACell placement.  She then got readmitted for separate issues and we were able to place more ACell.  She has gotten some weeping of the leg today.  No change in her medications or social history.  Otherwise, on exam, she is alert, oriented, cooperative, not in any distress.  Pupils are equal. Breathing is unlabored.  Heart rate is regular.  The left lower extremity is a little bit swollen, hemosiderosis, some weeping, a little bit of redness.  The VAC has been on this past week and she has had some silver collagen on under the VAC.  The wound is improving.  Some debridement was done today and we will continue with the silver collagen and I will hold off on the [facility] but we will do the Profore because of the drainage.  I think it is going to be difficult for the [facility] tape to stay in place.     Theodoro Kos, DO     CS/MEDQ  D:  04/06/2014  T:  04/07/2014  Job:  [id number]

## 2014-04-13 ENCOUNTER — Encounter (HOSPITAL_BASED_OUTPATIENT_CLINIC_OR_DEPARTMENT_OTHER): Payer: Medicare Other | Attending: Plastic Surgery

## 2014-04-13 DIAGNOSIS — I872 Venous insufficiency (chronic) (peripheral): Secondary | ICD-10-CM | POA: Insufficient documentation

## 2014-04-13 DIAGNOSIS — I890 Lymphedema, not elsewhere classified: Secondary | ICD-10-CM | POA: Insufficient documentation

## 2014-04-13 DIAGNOSIS — Z91040 Latex allergy status: Secondary | ICD-10-CM | POA: Insufficient documentation

## 2014-04-13 DIAGNOSIS — L97809 Non-pressure chronic ulcer of other part of unspecified lower leg with unspecified severity: Secondary | ICD-10-CM | POA: Diagnosis not present

## 2014-04-13 DIAGNOSIS — E119 Type 2 diabetes mellitus without complications: Secondary | ICD-10-CM | POA: Insufficient documentation

## 2014-04-14 NOTE — Progress Notes (Signed)
[facility]  NAMEPRABHJOT, [name]               ACCOUNT NO.:  [id number]  MEDICAL RECORD NO.:  [id number]      DATE OF BIRTH:  [birth year]  PHYSICIAN:  Theodoro Kos, DO       VISIT DATE:  04/13/2014                                  OFFICE VISIT   HISTORY OF PRESENT ILLNESS:  The patient is a 68 year old female who is here for a followup on her left leg chronic venous insufficiency ulcer. Today, she is showing some signs of lymphedema as well.  The periwound area has been broken down and has signs of the lymphedema.  The actual wound has improved.  No change in her medications or social history. She has been using the collagen.  PHYSICAL EXAMINATION:  GENERAL:  She is alert, oriented, cooperative, not in any distress.  She is pleasant. HEENT:  Pupils equal.  Breathing is unlabored. HEART:  Rate is regular.  The wound is as described above.  We will switch to alginate, Sorbact, Profore, and have this changed this week at home and follow up in 1 week.     Theodoro Kos, DO     CS/MEDQ  D:  04/13/2014  T:  04/13/2014  Job:  [id number]

## 2014-04-20 DIAGNOSIS — E119 Type 2 diabetes mellitus without complications: Secondary | ICD-10-CM | POA: Diagnosis not present

## 2014-04-20 DIAGNOSIS — I890 Lymphedema, not elsewhere classified: Secondary | ICD-10-CM | POA: Diagnosis not present

## 2014-04-20 DIAGNOSIS — I872 Venous insufficiency (chronic) (peripheral): Secondary | ICD-10-CM | POA: Diagnosis not present

## 2014-04-20 DIAGNOSIS — L97809 Non-pressure chronic ulcer of other part of unspecified lower leg with unspecified severity: Secondary | ICD-10-CM | POA: Diagnosis not present

## 2014-04-27 DIAGNOSIS — E119 Type 2 diabetes mellitus without complications: Secondary | ICD-10-CM | POA: Diagnosis not present

## 2014-04-27 DIAGNOSIS — I890 Lymphedema, not elsewhere classified: Secondary | ICD-10-CM | POA: Diagnosis not present

## 2014-04-27 DIAGNOSIS — L97809 Non-pressure chronic ulcer of other part of unspecified lower leg with unspecified severity: Secondary | ICD-10-CM | POA: Diagnosis not present

## 2014-04-27 DIAGNOSIS — I872 Venous insufficiency (chronic) (peripheral): Secondary | ICD-10-CM | POA: Diagnosis not present

## 2014-05-04 DIAGNOSIS — E119 Type 2 diabetes mellitus without complications: Secondary | ICD-10-CM | POA: Diagnosis not present

## 2014-05-04 DIAGNOSIS — L97809 Non-pressure chronic ulcer of other part of unspecified lower leg with unspecified severity: Secondary | ICD-10-CM | POA: Diagnosis not present

## 2014-05-04 DIAGNOSIS — I872 Venous insufficiency (chronic) (peripheral): Secondary | ICD-10-CM | POA: Diagnosis not present

## 2014-05-04 DIAGNOSIS — I890 Lymphedema, not elsewhere classified: Secondary | ICD-10-CM | POA: Diagnosis not present

## 2014-05-11 DIAGNOSIS — I872 Venous insufficiency (chronic) (peripheral): Secondary | ICD-10-CM | POA: Diagnosis not present

## 2014-05-11 DIAGNOSIS — E119 Type 2 diabetes mellitus without complications: Secondary | ICD-10-CM | POA: Diagnosis not present

## 2014-05-11 DIAGNOSIS — L97809 Non-pressure chronic ulcer of other part of unspecified lower leg with unspecified severity: Secondary | ICD-10-CM | POA: Diagnosis not present

## 2014-05-11 DIAGNOSIS — I890 Lymphedema, not elsewhere classified: Secondary | ICD-10-CM | POA: Diagnosis not present

## 2014-05-12 NOTE — Progress Notes (Signed)
[facility]  NAME:  [name], [name]                    ACCOUNT NO.:  MEDICAL RECORD NO.:  [id number]      DATE OF BIRTH:  [birth year]  PHYSICIAN:  Irene Limbo, MD    VISIT DATE:  05/11/2014                                  OFFICE VISIT   CHIEF COMPLAINT:  Follow up of chronic venous ulcer of left lower extremity in the setting of obesity and lymphedema.  HISTORY OF PRESENT ILLNESS:  The patient is a 68 year old ambulatory female with several year old history of chronic ulceration of left lower extremity.  She has previously been evaluated by Vascular Surgery through [facility] system and was not felt to be a surgical candidate. She has had at least 2 applications of ACell this year with Dr. Migdalia Dk. We discontinued her VAC approximately 6 weeks ago.  Currently, she is in Profore compression and using Hydrofera Blue.  The patient reports that she notes improvement in the color of the wound and there is less green drainage; however, she continues to have copious amount of drainage and was instructed by [place] wound care to remove her outer dressings and place silver alginate yesterday on Sunday, as they have noticed soaked wraps on each visit.  She has also instituted dressings over her right lower extremity for wounds.  She has declined to have [place] evaluate any of her right lower extremity wound and she states that they are nearly healed.  With regard to her left lower extremity, I counseled that I feel that she has a copious amount of drainage.  She has not been able to tolerate using lymphedema pump on this side.  We have made a referral to the Lymphedema Clinic.  I offered to continue with Profore layered compression; however, I would like to change it daily given the amount of drainage she has present.  She states that Medicare will only authorize 8 Profore wraps a month and she cannot afford to purchase any additional ones.  I counseled her that she is  receiving more than 8 wraps per month currently. However, if she does not want to increase the amount of dressing changes, we also discussed using Juxta-Lite wraps, so that she can change the outer dressing daily.  The patient reports that she is unable to tolerate Kerlix wraps.  She is also unable to place the inner sleeve of the Juxta-Lite over her dressings.  After further discussion, she is willing to try zinc as barrier to the surrounding skin.  We will reinstitute silver alginate with Profore compression to be changed three times a week.  I have encouraged her that if her wraps were saturated, she needs to remove them and rewrap her legs.  We will plan for followup in 2 weeks' time.  Fifteen minutes was spent with the patient over half in counseling, no debridement was performed.          ______________________________ Irene Limbo, MD     BT/MEDQ  D:  05/11/2014  T:  05/12/2014  Job:  [id number]

## 2014-05-25 ENCOUNTER — Encounter (HOSPITAL_BASED_OUTPATIENT_CLINIC_OR_DEPARTMENT_OTHER): Payer: Medicare Other | Attending: Plastic Surgery

## 2014-05-25 DIAGNOSIS — L97809 Non-pressure chronic ulcer of other part of unspecified lower leg with unspecified severity: Secondary | ICD-10-CM | POA: Insufficient documentation

## 2014-05-25 DIAGNOSIS — I872 Venous insufficiency (chronic) (peripheral): Secondary | ICD-10-CM | POA: Insufficient documentation

## 2014-06-03 DIAGNOSIS — I872 Venous insufficiency (chronic) (peripheral): Secondary | ICD-10-CM | POA: Diagnosis not present

## 2014-06-05 DIAGNOSIS — I872 Venous insufficiency (chronic) (peripheral): Secondary | ICD-10-CM | POA: Diagnosis not present

## 2014-06-08 DIAGNOSIS — I872 Venous insufficiency (chronic) (peripheral): Secondary | ICD-10-CM | POA: Diagnosis not present

## 2014-06-08 DIAGNOSIS — L97809 Non-pressure chronic ulcer of other part of unspecified lower leg with unspecified severity: Secondary | ICD-10-CM | POA: Diagnosis not present

## 2014-06-12 ENCOUNTER — Encounter (HOSPITAL_BASED_OUTPATIENT_CLINIC_OR_DEPARTMENT_OTHER): Payer: Medicare Other | Attending: Plastic Surgery

## 2014-06-12 DIAGNOSIS — L03116 Cellulitis of left lower limb: Secondary | ICD-10-CM | POA: Insufficient documentation

## 2014-06-12 DIAGNOSIS — I87312 Chronic venous hypertension (idiopathic) with ulcer of left lower extremity: Secondary | ICD-10-CM | POA: Insufficient documentation

## 2014-06-12 DIAGNOSIS — L97429 Non-pressure chronic ulcer of left heel and midfoot with unspecified severity: Secondary | ICD-10-CM | POA: Insufficient documentation

## 2014-06-12 DIAGNOSIS — L97329 Non-pressure chronic ulcer of left ankle with unspecified severity: Secondary | ICD-10-CM | POA: Insufficient documentation

## 2014-06-12 DIAGNOSIS — L97229 Non-pressure chronic ulcer of left calf with unspecified severity: Secondary | ICD-10-CM | POA: Insufficient documentation

## 2014-06-12 DIAGNOSIS — I878 Other specified disorders of veins: Secondary | ICD-10-CM | POA: Insufficient documentation

## 2014-06-12 DIAGNOSIS — I890 Lymphedema, not elsewhere classified: Secondary | ICD-10-CM | POA: Insufficient documentation

## 2014-06-12 DIAGNOSIS — L97529 Non-pressure chronic ulcer of other part of left foot with unspecified severity: Secondary | ICD-10-CM | POA: Insufficient documentation

## 2014-06-15 DIAGNOSIS — L03116 Cellulitis of left lower limb: Secondary | ICD-10-CM | POA: Diagnosis not present

## 2014-06-15 DIAGNOSIS — I890 Lymphedema, not elsewhere classified: Secondary | ICD-10-CM | POA: Diagnosis not present

## 2014-06-15 DIAGNOSIS — L97229 Non-pressure chronic ulcer of left calf with unspecified severity: Secondary | ICD-10-CM | POA: Diagnosis not present

## 2014-06-15 DIAGNOSIS — L97329 Non-pressure chronic ulcer of left ankle with unspecified severity: Secondary | ICD-10-CM | POA: Diagnosis not present

## 2014-06-15 DIAGNOSIS — I87312 Chronic venous hypertension (idiopathic) with ulcer of left lower extremity: Secondary | ICD-10-CM | POA: Diagnosis present

## 2014-06-15 DIAGNOSIS — L97529 Non-pressure chronic ulcer of other part of left foot with unspecified severity: Secondary | ICD-10-CM | POA: Diagnosis not present

## 2014-06-15 DIAGNOSIS — I878 Other specified disorders of veins: Secondary | ICD-10-CM | POA: Diagnosis not present

## 2014-06-15 DIAGNOSIS — L97429 Non-pressure chronic ulcer of left heel and midfoot with unspecified severity: Secondary | ICD-10-CM | POA: Diagnosis not present

## 2014-06-22 DIAGNOSIS — I87312 Chronic venous hypertension (idiopathic) with ulcer of left lower extremity: Secondary | ICD-10-CM | POA: Diagnosis not present

## 2014-06-22 DIAGNOSIS — I878 Other specified disorders of veins: Secondary | ICD-10-CM | POA: Diagnosis not present

## 2014-06-22 DIAGNOSIS — L97329 Non-pressure chronic ulcer of left ankle with unspecified severity: Secondary | ICD-10-CM | POA: Diagnosis not present

## 2014-06-22 DIAGNOSIS — L97229 Non-pressure chronic ulcer of left calf with unspecified severity: Secondary | ICD-10-CM | POA: Diagnosis not present

## 2014-07-06 DIAGNOSIS — I878 Other specified disorders of veins: Secondary | ICD-10-CM | POA: Diagnosis not present

## 2014-07-06 DIAGNOSIS — L97229 Non-pressure chronic ulcer of left calf with unspecified severity: Secondary | ICD-10-CM | POA: Diagnosis not present

## 2014-07-06 DIAGNOSIS — I87312 Chronic venous hypertension (idiopathic) with ulcer of left lower extremity: Secondary | ICD-10-CM | POA: Diagnosis not present

## 2014-07-06 DIAGNOSIS — L97329 Non-pressure chronic ulcer of left ankle with unspecified severity: Secondary | ICD-10-CM | POA: Diagnosis not present

## 2014-07-07 NOTE — Progress Notes (Signed)
[facility]  [name], [name]               ACCOUNT NO.:  [id number]  MEDICAL RECORD NO.:  [id number]      DATE OF BIRTH:  [birth year]  PHYSICIAN:  Irene Limbo, MD    VISIT DATE:  07/06/2014                                  OFFICE VISIT   CHIEF COMPLAINT:  Follow up of left lower extremity ulcerations.  HISTORY OF PRESENT ILLNESS:  The patient is a 68 year old ambulatory female with chronic left lower extremity ulcerations have been present for over 2 years time.  She has been a patient at both [facility] and [facility].  She has undergone operative debridement and multiple additional modalities including placement of ACell.  She returns today with complaints that she has had copious drainage from her legs.  She had to change her dressings nearly daily for the last week secondary to pooling of drainage.  These have caused secondary wounds over her posterior ankle and today over her anterior legs distal to her initial wound. She has also developed ulcers over the dorsum of her foot.  We reviewed options today with the patient.  She has been referred to the lymphedema Clinic in [place] which are available through the orthopedic surgeon's offices, however, there is a very lengthy waiting list for this and she has been placed on them.  She is unable to use her lymphedema pump she states over the left lower extremity secondary to pain.  We reviewed daily dressing changes to her left lower extremity to try to keep up with the moisture and prevent secondary macerations.  She has expressed concern with this given the expense of the dressings and her allotment from insurance on a monthly basis.  Following further discussion today, she is willing to try Juxta- Lite compression over the left lower extremity.  She is currently using this over her right lower extremity.  She expressed concern over the sizing of the Juxta-Lite and states that if her legs  well that she is unable to use these.  I have counseled her that certainly the layered compression is a more effective compression, however, with the amount of drainage she has present, we do not seem to be able to keep up with this, and in the absence of more frequent dressing changes, she will continue to develop more secondary macerations.  She has asked that we apply additional absorptive material, such as the first layer from the Profore wrap, in addition to the silver alginate and stretch gauze to try to control the drainage.  My hope is that with the use of the Juxta-Lite compression, the patient and her husband will be more able to address saturated dressings.  We will plan for followup in 1 week's time.  15 minutes was spent with the patient over half in counseling, no debridement was performed.          ______________________________ Irene Limbo, MD MBA     BT/MEDQ  D:  07/06/2014  T:  07/07/2014  Job:  [id number]

## 2014-07-13 ENCOUNTER — Encounter (HOSPITAL_BASED_OUTPATIENT_CLINIC_OR_DEPARTMENT_OTHER): Payer: Medicare Other | Attending: Plastic Surgery

## 2014-07-13 DIAGNOSIS — I890 Lymphedema, not elsewhere classified: Secondary | ICD-10-CM | POA: Diagnosis not present

## 2014-07-13 DIAGNOSIS — L97321 Non-pressure chronic ulcer of left ankle limited to breakdown of skin: Secondary | ICD-10-CM | POA: Diagnosis not present

## 2014-07-13 DIAGNOSIS — E669 Obesity, unspecified: Secondary | ICD-10-CM | POA: Insufficient documentation

## 2014-07-13 DIAGNOSIS — L97929 Non-pressure chronic ulcer of unspecified part of left lower leg with unspecified severity: Secondary | ICD-10-CM | POA: Diagnosis not present

## 2014-07-13 DIAGNOSIS — L97821 Non-pressure chronic ulcer of other part of left lower leg limited to breakdown of skin: Secondary | ICD-10-CM | POA: Diagnosis not present

## 2014-07-13 DIAGNOSIS — I878 Other specified disorders of veins: Secondary | ICD-10-CM | POA: Diagnosis present

## 2014-07-20 DIAGNOSIS — I878 Other specified disorders of veins: Secondary | ICD-10-CM | POA: Diagnosis not present

## 2014-07-20 DIAGNOSIS — L97321 Non-pressure chronic ulcer of left ankle limited to breakdown of skin: Secondary | ICD-10-CM | POA: Diagnosis not present

## 2014-07-20 DIAGNOSIS — L97821 Non-pressure chronic ulcer of other part of left lower leg limited to breakdown of skin: Secondary | ICD-10-CM | POA: Diagnosis not present

## 2014-07-20 DIAGNOSIS — I890 Lymphedema, not elsewhere classified: Secondary | ICD-10-CM | POA: Diagnosis not present

## 2014-07-27 DIAGNOSIS — I878 Other specified disorders of veins: Secondary | ICD-10-CM | POA: Diagnosis not present

## 2014-07-27 DIAGNOSIS — L97321 Non-pressure chronic ulcer of left ankle limited to breakdown of skin: Secondary | ICD-10-CM | POA: Diagnosis not present

## 2014-07-27 DIAGNOSIS — L97821 Non-pressure chronic ulcer of other part of left lower leg limited to breakdown of skin: Secondary | ICD-10-CM | POA: Diagnosis not present

## 2014-07-27 DIAGNOSIS — I890 Lymphedema, not elsewhere classified: Secondary | ICD-10-CM | POA: Diagnosis not present

## 2014-08-10 DIAGNOSIS — I890 Lymphedema, not elsewhere classified: Secondary | ICD-10-CM | POA: Diagnosis not present

## 2014-08-10 DIAGNOSIS — I878 Other specified disorders of veins: Secondary | ICD-10-CM | POA: Diagnosis not present

## 2014-08-10 DIAGNOSIS — L97821 Non-pressure chronic ulcer of other part of left lower leg limited to breakdown of skin: Secondary | ICD-10-CM | POA: Diagnosis not present

## 2014-08-10 DIAGNOSIS — L97321 Non-pressure chronic ulcer of left ankle limited to breakdown of skin: Secondary | ICD-10-CM | POA: Diagnosis not present

## 2014-08-24 ENCOUNTER — Encounter (HOSPITAL_BASED_OUTPATIENT_CLINIC_OR_DEPARTMENT_OTHER): Payer: Medicare Other | Attending: Plastic Surgery

## 2014-08-24 DIAGNOSIS — L97929 Non-pressure chronic ulcer of unspecified part of left lower leg with unspecified severity: Secondary | ICD-10-CM | POA: Insufficient documentation

## 2014-08-24 DIAGNOSIS — I872 Venous insufficiency (chronic) (peripheral): Secondary | ICD-10-CM | POA: Insufficient documentation

## 2014-08-25 NOTE — Progress Notes (Signed)
[facility]  NAMECEYDA, PETERKA               ACCOUNT NO.:  [id number]  MEDICAL RECORD NO.:  [id number]      DATE OF BIRTH:  1946/05/02  PHYSICIAN:  Theodoro Kos, DO       VISIT DATE:  08/24/2014                                  OFFICE VISIT   The patient is a 68 year old female who is here for followup on her left lower extremity chronic venous insufficiency ulcers.  She looks remarkably better than she did last time I saw her.  She has been using alginate with a Profore Lite.  There has been no change in her medications or social history.  REVIEW OF SYSTEMS:  Negative.  On exam, she is alert, oriented, cooperative, very aware of her medical condition.  Pupils are equal.  Breathing is unlabored.  Heart rate is regular.  The wound has good granulation tissue and no sign of infection.  The sizes are noted in the chart.  We will continue with the alginate and Profore and we will see her back in 1 week.     Theodoro Kos, DO     CS/MEDQ  D:  08/24/2014  T:  08/25/2014  Job:  [id number]

## 2014-09-07 DIAGNOSIS — L97929 Non-pressure chronic ulcer of unspecified part of left lower leg with unspecified severity: Secondary | ICD-10-CM | POA: Diagnosis not present

## 2014-09-07 DIAGNOSIS — I872 Venous insufficiency (chronic) (peripheral): Secondary | ICD-10-CM | POA: Diagnosis not present

## 2014-09-21 ENCOUNTER — Encounter (HOSPITAL_BASED_OUTPATIENT_CLINIC_OR_DEPARTMENT_OTHER): Payer: Medicare Other | Attending: Plastic Surgery

## 2014-09-21 DIAGNOSIS — I890 Lymphedema, not elsewhere classified: Secondary | ICD-10-CM | POA: Diagnosis not present

## 2014-09-21 DIAGNOSIS — E669 Obesity, unspecified: Secondary | ICD-10-CM | POA: Diagnosis not present

## 2014-09-21 DIAGNOSIS — L97829 Non-pressure chronic ulcer of other part of left lower leg with unspecified severity: Secondary | ICD-10-CM | POA: Diagnosis not present

## 2014-09-21 DIAGNOSIS — I872 Venous insufficiency (chronic) (peripheral): Secondary | ICD-10-CM | POA: Diagnosis not present

## 2014-10-12 ENCOUNTER — Encounter (HOSPITAL_BASED_OUTPATIENT_CLINIC_OR_DEPARTMENT_OTHER): Payer: Medicare Other | Attending: Plastic Surgery

## 2014-10-12 DIAGNOSIS — I890 Lymphedema, not elsewhere classified: Secondary | ICD-10-CM | POA: Diagnosis not present

## 2014-10-12 DIAGNOSIS — L97929 Non-pressure chronic ulcer of unspecified part of left lower leg with unspecified severity: Secondary | ICD-10-CM | POA: Diagnosis not present

## 2014-10-12 DIAGNOSIS — I872 Venous insufficiency (chronic) (peripheral): Secondary | ICD-10-CM | POA: Insufficient documentation

## 2014-10-12 DIAGNOSIS — E669 Obesity, unspecified: Secondary | ICD-10-CM | POA: Diagnosis not present

## 2014-11-02 DIAGNOSIS — I872 Venous insufficiency (chronic) (peripheral): Secondary | ICD-10-CM | POA: Diagnosis not present

## 2014-11-02 DIAGNOSIS — I890 Lymphedema, not elsewhere classified: Secondary | ICD-10-CM | POA: Diagnosis not present

## 2014-11-02 DIAGNOSIS — E669 Obesity, unspecified: Secondary | ICD-10-CM | POA: Diagnosis not present

## 2014-11-02 DIAGNOSIS — L97929 Non-pressure chronic ulcer of unspecified part of left lower leg with unspecified severity: Secondary | ICD-10-CM | POA: Diagnosis not present

## 2014-11-16 ENCOUNTER — Encounter (HOSPITAL_BASED_OUTPATIENT_CLINIC_OR_DEPARTMENT_OTHER): Payer: Medicare Other | Attending: Plastic Surgery

## 2014-11-16 DIAGNOSIS — L97821 Non-pressure chronic ulcer of other part of left lower leg limited to breakdown of skin: Secondary | ICD-10-CM | POA: Diagnosis not present

## 2014-11-16 DIAGNOSIS — I83028 Varicose veins of left lower extremity with ulcer other part of lower leg: Secondary | ICD-10-CM | POA: Insufficient documentation

## 2014-11-16 DIAGNOSIS — I890 Lymphedema, not elsewhere classified: Secondary | ICD-10-CM | POA: Insufficient documentation

## 2014-11-30 DIAGNOSIS — I83028 Varicose veins of left lower extremity with ulcer other part of lower leg: Secondary | ICD-10-CM | POA: Diagnosis not present

## 2014-11-30 DIAGNOSIS — I890 Lymphedema, not elsewhere classified: Secondary | ICD-10-CM | POA: Diagnosis not present

## 2014-11-30 DIAGNOSIS — L97821 Non-pressure chronic ulcer of other part of left lower leg limited to breakdown of skin: Secondary | ICD-10-CM | POA: Diagnosis not present

## 2014-12-21 ENCOUNTER — Encounter (HOSPITAL_BASED_OUTPATIENT_CLINIC_OR_DEPARTMENT_OTHER): Payer: Medicare Other | Attending: Plastic Surgery

## 2014-12-21 DIAGNOSIS — Z6841 Body Mass Index (BMI) 40.0 and over, adult: Secondary | ICD-10-CM | POA: Diagnosis not present

## 2014-12-21 DIAGNOSIS — I890 Lymphedema, not elsewhere classified: Secondary | ICD-10-CM | POA: Diagnosis present

## 2014-12-21 DIAGNOSIS — I872 Venous insufficiency (chronic) (peripheral): Secondary | ICD-10-CM | POA: Diagnosis not present

## 2014-12-21 DIAGNOSIS — L97821 Non-pressure chronic ulcer of other part of left lower leg limited to breakdown of skin: Secondary | ICD-10-CM | POA: Diagnosis present

## 2014-12-21 DIAGNOSIS — E119 Type 2 diabetes mellitus without complications: Secondary | ICD-10-CM | POA: Insufficient documentation

## 2015-01-11 ENCOUNTER — Encounter (HOSPITAL_BASED_OUTPATIENT_CLINIC_OR_DEPARTMENT_OTHER): Payer: Medicare Other | Attending: Plastic Surgery

## 2015-01-11 DIAGNOSIS — L97921 Non-pressure chronic ulcer of unspecified part of left lower leg limited to breakdown of skin: Secondary | ICD-10-CM | POA: Diagnosis not present

## 2015-01-11 DIAGNOSIS — I890 Lymphedema, not elsewhere classified: Secondary | ICD-10-CM | POA: Insufficient documentation

## 2015-01-11 DIAGNOSIS — I872 Venous insufficiency (chronic) (peripheral): Secondary | ICD-10-CM | POA: Insufficient documentation

## 2015-01-11 DIAGNOSIS — Z6841 Body Mass Index (BMI) 40.0 and over, adult: Secondary | ICD-10-CM | POA: Diagnosis not present

## 2015-02-01 DIAGNOSIS — I890 Lymphedema, not elsewhere classified: Secondary | ICD-10-CM | POA: Diagnosis not present

## 2015-02-01 DIAGNOSIS — I872 Venous insufficiency (chronic) (peripheral): Secondary | ICD-10-CM | POA: Diagnosis not present

## 2015-02-01 DIAGNOSIS — L97921 Non-pressure chronic ulcer of unspecified part of left lower leg limited to breakdown of skin: Secondary | ICD-10-CM | POA: Diagnosis not present

## 2015-02-22 ENCOUNTER — Encounter (HOSPITAL_BASED_OUTPATIENT_CLINIC_OR_DEPARTMENT_OTHER): Payer: Medicare Other | Attending: Plastic Surgery

## 2015-02-22 DIAGNOSIS — Z6841 Body Mass Index (BMI) 40.0 and over, adult: Secondary | ICD-10-CM | POA: Insufficient documentation

## 2015-02-22 DIAGNOSIS — I890 Lymphedema, not elsewhere classified: Secondary | ICD-10-CM | POA: Insufficient documentation

## 2015-02-22 DIAGNOSIS — I872 Venous insufficiency (chronic) (peripheral): Secondary | ICD-10-CM | POA: Insufficient documentation

## 2015-02-22 DIAGNOSIS — L97821 Non-pressure chronic ulcer of other part of left lower leg limited to breakdown of skin: Secondary | ICD-10-CM | POA: Insufficient documentation

## 2015-02-22 DIAGNOSIS — E669 Obesity, unspecified: Secondary | ICD-10-CM | POA: Insufficient documentation

## 2015-03-09 IMAGING — XA IR FLUORO GUIDE CV LINE*R*
1 series · 2 of 2 positions shown · non-contrast
Comparison: none

CLINICAL DATA: Pseudomonas infection, needs venous access. Chronic
kidney disease.

EXAM:
RIGHT IJ CENTRAL VENOUS CATHETER PLACEMENT UNDER ULTRASOUND AND
FLUOROSCOPIC GUIDANCE:
FLUOROSCOPY TIME:  6 seconds
TECHNIQUE: The procedure, risks (including but not limited to bleeding,
infection, organ damage ), benefits, and alternatives were explained
to the patient. Questions regarding the procedure were encouraged
and answered. The patient understands and consents to the procedure.
Patency of the right IJ vein was confirmed with ultrasound with
image documentation. An appropriate skin site was determined. Skin
site was marked. Region was prepped using maximum barrier technique
including cap and mask, sterile gown, sterile gloves, large sterile
sheet, and Chlorhexidine as cutaneous antisepsis. The region was
infiltrated locally with 1% lidocaine. Under real-time ultrasound
guidance, the right IJ vein was accessed with a 21 gauge
micropuncture needle; the needle tip within the vein was confirmed
with ultrasound image documentation. The needle exchanged over a
guidewire for peel-away

[Series 1: run · 2 of 2 slices shown]
[im 1/2]
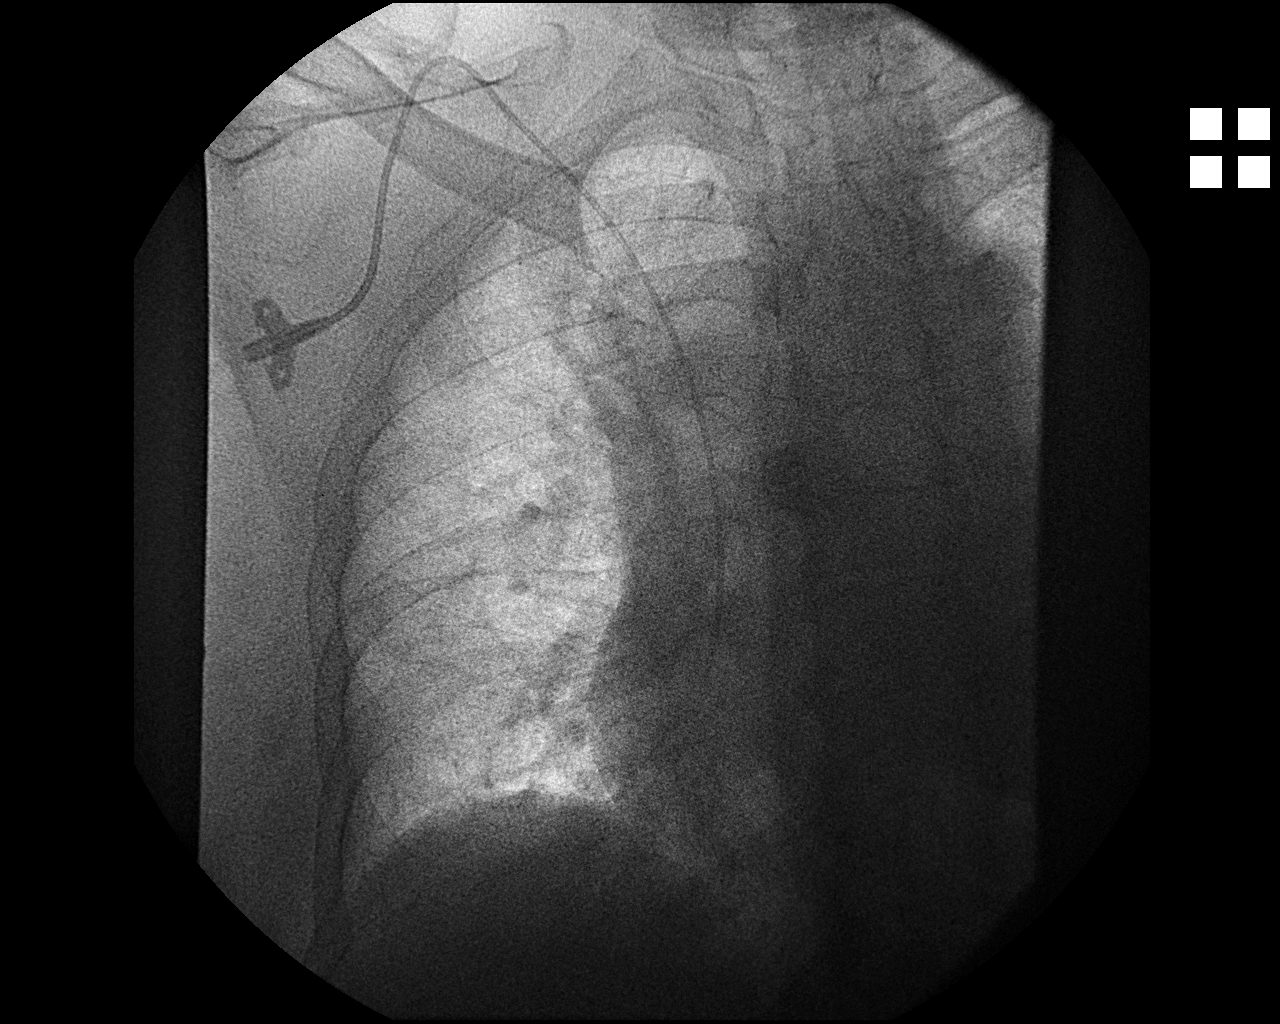
[im 2/2]
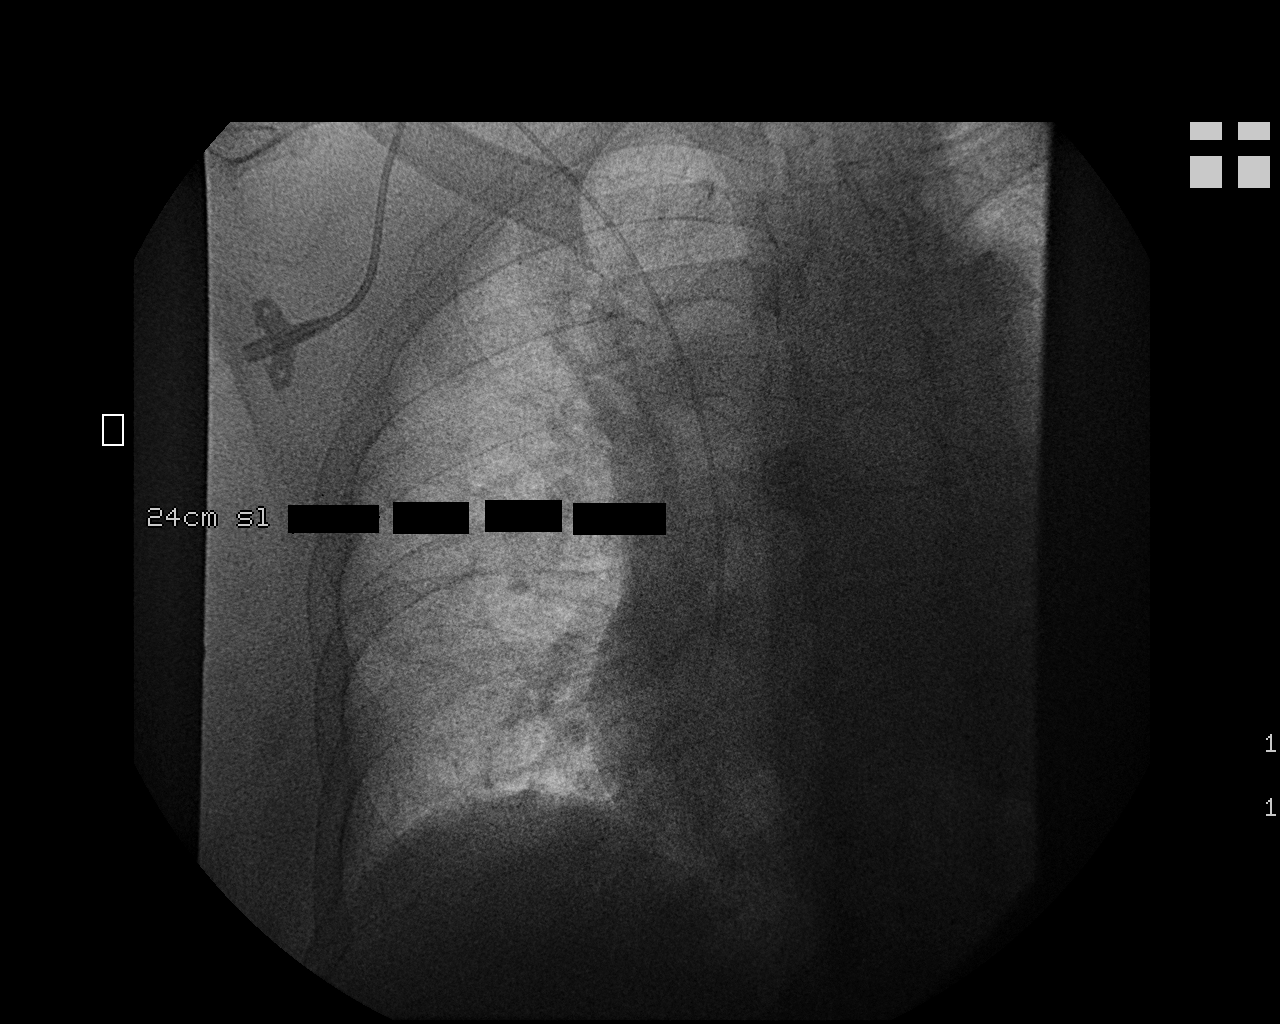

[2 of 2 positions shown; findings below may reference images not displayed]

sheath through which an 24cm 5 French PowerPICC Single lumen
catheter was

advanced. This was positioned with the tip at the cavoatrial
junction. Spot

chest radiograph shows good positioning and no pneumothorax.
Catheter was flushed

and sutured externally with 0-Prolene sutures. Patient tolerated the
procedure

well, with no immediate complication.
IMPRESSION: 1. Technically successful right IJ Single lumenpower injectable
central venous catheter placement.

## 2015-03-22 ENCOUNTER — Encounter (HOSPITAL_BASED_OUTPATIENT_CLINIC_OR_DEPARTMENT_OTHER): Payer: Medicare Other | Attending: Plastic Surgery

## 2015-03-22 DIAGNOSIS — I83023 Varicose veins of left lower extremity with ulcer of ankle: Secondary | ICD-10-CM | POA: Insufficient documentation

## 2015-03-22 DIAGNOSIS — E11622 Type 2 diabetes mellitus with other skin ulcer: Secondary | ICD-10-CM | POA: Diagnosis not present

## 2015-03-22 DIAGNOSIS — I890 Lymphedema, not elsewhere classified: Secondary | ICD-10-CM | POA: Diagnosis not present

## 2015-04-12 ENCOUNTER — Encounter (HOSPITAL_BASED_OUTPATIENT_CLINIC_OR_DEPARTMENT_OTHER): Payer: Medicare Other | Attending: Plastic Surgery

## 2015-04-12 DIAGNOSIS — Z6841 Body Mass Index (BMI) 40.0 and over, adult: Secondary | ICD-10-CM | POA: Insufficient documentation

## 2015-04-12 DIAGNOSIS — E669 Obesity, unspecified: Secondary | ICD-10-CM | POA: Diagnosis not present

## 2015-04-12 DIAGNOSIS — I83028 Varicose veins of left lower extremity with ulcer other part of lower leg: Secondary | ICD-10-CM | POA: Diagnosis not present

## 2015-04-12 DIAGNOSIS — L97821 Non-pressure chronic ulcer of other part of left lower leg limited to breakdown of skin: Secondary | ICD-10-CM | POA: Diagnosis not present

## 2015-04-12 DIAGNOSIS — E119 Type 2 diabetes mellitus without complications: Secondary | ICD-10-CM | POA: Diagnosis not present

## 2015-04-22 ENCOUNTER — Other Ambulatory Visit: Payer: Self-pay | Admitting: Family Medicine

## 2015-04-22 DIAGNOSIS — R17 Unspecified jaundice: Secondary | ICD-10-CM

## 2015-04-23 ENCOUNTER — Ambulatory Visit
Admission: RE | Admit: 2015-04-23 | Discharge: 2015-04-23 | Disposition: A | Discharge status: Home / Self Care | Payer: Medicare Other | Source: Ambulatory Visit | Attending: Family Medicine | Admitting: Family Medicine

## 2015-04-23 DIAGNOSIS — R17 Unspecified jaundice: Secondary | ICD-10-CM

## 2015-05-03 DIAGNOSIS — I83028 Varicose veins of left lower extremity with ulcer other part of lower leg: Secondary | ICD-10-CM | POA: Diagnosis not present

## 2015-05-03 DIAGNOSIS — L97821 Non-pressure chronic ulcer of other part of left lower leg limited to breakdown of skin: Secondary | ICD-10-CM | POA: Diagnosis not present

## 2015-05-03 DIAGNOSIS — E669 Obesity, unspecified: Secondary | ICD-10-CM | POA: Diagnosis not present

## 2015-05-03 DIAGNOSIS — Z6841 Body Mass Index (BMI) 40.0 and over, adult: Secondary | ICD-10-CM | POA: Diagnosis not present

## 2015-05-07 ENCOUNTER — Encounter: Payer: Self-pay | Admitting: Podiatry

## 2015-05-07 ENCOUNTER — Ambulatory Visit (INDEPENDENT_AMBULATORY_CARE_PROVIDER_SITE_OTHER): Payer: Medicare Other | Admitting: Podiatry

## 2015-05-07 VITALS — BP 135/67 | HR 77 | Resp 18

## 2015-05-07 DIAGNOSIS — B351 Tinea unguium: Secondary | ICD-10-CM

## 2015-05-07 DIAGNOSIS — M79673 Pain in unspecified foot: Secondary | ICD-10-CM | POA: Diagnosis not present

## 2015-05-07 NOTE — Progress Notes (Signed)
Subjective:     Patient ID: [name], female   DOB: April 18, 1946, 69 y.o.   MRN: [id number]  HPIThis patient presents to the office per recommendation of her daughter for her unattached nails on her first and second toe right foot. She is diabetic under care for ulcer left leg for which she has unna boot on left leg.  She says her nails have become unattached and formed dead tissue under her nails.  No pain or drainage noted.  She is diabetic and she is concerned about her nails at this time.   Review of Systems     Objective:   Physical Exam GENERAL APPEARANCE: Alert, conversant. Appropriately groomed. No acute distress.  VASCULAR: Pedal pulses palpable at  [facility] and PT right foot.  The pedal pulses are not palpated due to unna boot.  Capillary refill time is immediate to all digits,  Normal temperature gradient. NEUROLOGIC: sensation is normal to 5.07 monofilament at 5/5 sites bilateral.  Light touch is intact bilateral, Muscle strength normal.  MUSCULOSKELETAL: acceptable muscle strength, tone and stability bilateral.  Intrinsic muscluature intact bilateral.  Rectus appearance of foot and digits noted bilateral.   DERMATOLOGIC: skin color, texture, and turgor are within normal limits.  No preulcerative lesions or ulcers  are seen, no interdigital maceration noted.  No open lesions present.  Digital nails are asymptomatic. No drainage noted. There is unattached nail plate right hallux with no pain redness or drainage.      Assessment:     Onychomycosis     Plan:     IE  Debridement of Nails 1,2 right foot.  RTC 3 months

## 2015-05-24 ENCOUNTER — Encounter (HOSPITAL_BASED_OUTPATIENT_CLINIC_OR_DEPARTMENT_OTHER): Payer: Medicare Other | Attending: Plastic Surgery

## 2015-05-24 DIAGNOSIS — Z6841 Body Mass Index (BMI) 40.0 and over, adult: Secondary | ICD-10-CM | POA: Insufficient documentation

## 2015-05-24 DIAGNOSIS — E669 Obesity, unspecified: Secondary | ICD-10-CM | POA: Insufficient documentation

## 2015-05-24 DIAGNOSIS — E11622 Type 2 diabetes mellitus with other skin ulcer: Secondary | ICD-10-CM | POA: Insufficient documentation

## 2015-05-24 DIAGNOSIS — R4701 Aphasia: Secondary | ICD-10-CM | POA: Insufficient documentation

## 2015-05-24 DIAGNOSIS — I83028 Varicose veins of left lower extremity with ulcer other part of lower leg: Secondary | ICD-10-CM | POA: Insufficient documentation

## 2015-05-24 DIAGNOSIS — L97821 Non-pressure chronic ulcer of other part of left lower leg limited to breakdown of skin: Secondary | ICD-10-CM | POA: Insufficient documentation

## 2015-05-24 DIAGNOSIS — I890 Lymphedema, not elsewhere classified: Secondary | ICD-10-CM | POA: Insufficient documentation

## 2015-05-26 ENCOUNTER — Emergency Department (HOSPITAL_COMMUNITY)
Admission: EM | Admit: 2015-05-26 | Discharge: 2015-05-27 | Disposition: A | Discharge status: Home / Self Care | Payer: Medicare Other | Attending: Emergency Medicine | Admitting: Emergency Medicine

## 2015-05-26 ENCOUNTER — Encounter (HOSPITAL_COMMUNITY): Payer: Self-pay | Admitting: Emergency Medicine

## 2015-05-26 ENCOUNTER — Emergency Department (HOSPITAL_COMMUNITY): Payer: Medicare Other

## 2015-05-26 DIAGNOSIS — Z79899 Other long term (current) drug therapy: Secondary | ICD-10-CM | POA: Diagnosis not present

## 2015-05-26 DIAGNOSIS — E1121 Type 2 diabetes mellitus with diabetic nephropathy: Secondary | ICD-10-CM | POA: Diagnosis not present

## 2015-05-26 DIAGNOSIS — D649 Anemia, unspecified: Secondary | ICD-10-CM | POA: Diagnosis not present

## 2015-05-26 DIAGNOSIS — R4701 Aphasia: Secondary | ICD-10-CM | POA: Insufficient documentation

## 2015-05-26 DIAGNOSIS — G939 Disorder of brain, unspecified: Secondary | ICD-10-CM | POA: Insufficient documentation

## 2015-05-26 DIAGNOSIS — Z794 Long term (current) use of insulin: Secondary | ICD-10-CM | POA: Insufficient documentation

## 2015-05-26 DIAGNOSIS — Z91040 Latex allergy status: Secondary | ICD-10-CM | POA: Diagnosis not present

## 2015-05-26 LAB — DIFFERENTIAL
BASOS ABS: 0 10*3/uL (ref 0.0–0.1)
BASOS PCT: 0 %
EOS ABS: 0.2 10*3/uL (ref 0.0–0.7)
EOS PCT: 3 %
LYMPHS ABS: 1.7 10*3/uL (ref 0.7–4.0)
Lymphocytes Relative: 26 %
Monocytes Absolute: 0.5 10*3/uL (ref 0.1–1.0)
Monocytes Relative: 7 %
NEUTROS PCT: 63 %
Neutro Abs: 4.2 10*3/uL (ref 1.7–7.7)

## 2015-05-26 LAB — CBG MONITORING, ED: GLUCOSE-CAPILLARY: 157 mg/dL — AB (ref 65–99)

## 2015-05-26 LAB — CBC
HCT: 32 % — ABNORMAL LOW (ref 36.0–46.0)
HEMOGLOBIN: 11 g/dL — AB (ref 12.0–15.0)
MCH: 28.8 pg (ref 26.0–34.0)
MCHC: 34.4 g/dL (ref 30.0–36.0)
MCV: 83.8 fL (ref 78.0–100.0)
PLATELETS: 267 10*3/uL (ref 150–400)
RBC: 3.82 MIL/uL — ABNORMAL LOW (ref 3.87–5.11)
RDW: 15.3 % (ref 11.5–15.5)
WBC: 6.6 10*3/uL (ref 4.0–10.5)

## 2015-05-26 LAB — I-STAT CHEM 8, ED
BUN: 16 mg/dL (ref 6–20)
CALCIUM ION: 1.14 mmol/L (ref 1.13–1.30)
CHLORIDE: 101 mmol/L (ref 101–111)
Creatinine, Ser: 1.6 mg/dL — ABNORMAL HIGH (ref 0.44–1.00)
Glucose, Bld: 163 mg/dL — ABNORMAL HIGH (ref 65–99)
HEMATOCRIT: 34 % — AB (ref 36.0–46.0)
Hemoglobin: 11.6 g/dL — ABNORMAL LOW (ref 12.0–15.0)
Potassium: 2.9 mmol/L — ABNORMAL LOW (ref 3.5–5.1)
SODIUM: 139 mmol/L (ref 135–145)
TCO2: 23 mmol/L (ref 0–100)

## 2015-05-26 LAB — COMPREHENSIVE METABOLIC PANEL
ALBUMIN: 3.5 g/dL (ref 3.5–5.0)
ALT: 15 U/L (ref 14–54)
ANION GAP: 9 (ref 5–15)
AST: 23 U/L (ref 15–41)
Alkaline Phosphatase: 112 U/L (ref 38–126)
BILIRUBIN TOTAL: 1.5 mg/dL — AB (ref 0.3–1.2)
BUN: 15 mg/dL (ref 6–20)
CHLORIDE: 104 mmol/L (ref 101–111)
CO2: 23 mmol/L (ref 22–32)
Calcium: 9.1 mg/dL (ref 8.9–10.3)
Creatinine, Ser: 1.64 mg/dL — ABNORMAL HIGH (ref 0.44–1.00)
GFR calc Af Amer: 36 mL/min — ABNORMAL LOW (ref >60)
GFR calc non Af Amer: 31 mL/min — ABNORMAL LOW (ref >60)
GLUCOSE: 165 mg/dL — AB (ref 65–99)
POTASSIUM: 2.9 mmol/L — AB (ref 3.5–5.1)
SODIUM: 136 mmol/L (ref 135–145)
TOTAL PROTEIN: 7.9 g/dL (ref 6.5–8.1)

## 2015-05-26 LAB — I-STAT TROPONIN, ED: Troponin i, poc: 0.01 ng/mL (ref 0.00–0.08)

## 2015-05-26 LAB — APTT: APTT: 38 s — AB (ref 24–37)

## 2015-05-26 LAB — PROTIME-INR
INR: 1.28 (ref 0.00–1.49)
PROTHROMBIN TIME: 16.1 s — AB (ref 11.6–15.2)

## 2015-05-26 MED ORDER — POTASSIUM CHLORIDE 20 MEQ/15ML (10%) PO SOLN
40.0000 meq | Freq: Once | ORAL | Status: AC
  Administered 2015-05-26: 40 meq via ORAL
  Filled 2015-05-26: qty 30

## 2015-05-26 MED ORDER — ONDANSETRON HCL 4 MG/2ML IJ SOLN
4.0000 mg | Freq: Once | INTRAMUSCULAR | Status: AC
Start: 2015-05-26 — End: 2015-05-26
  Administered 2015-05-26: 4 mg via INTRAVENOUS
  Filled 2015-05-26: qty 2

## 2015-05-26 MED ORDER — HYDROMORPHONE HCL 1 MG/ML IJ SOLN
1.0000 mg | Freq: Once | INTRAMUSCULAR | Status: AC
  Administered 2015-05-26: 1 mg via INTRAVENOUS
  Filled 2015-05-26: qty 1

## 2015-05-26 MED ORDER — GADOBENATE DIMEGLUMINE 529 MG/ML IV SOLN
10.0000 mL | Freq: Once | INTRAVENOUS | Status: AC | PRN
  Administered 2015-05-26: 10 mL via INTRAVENOUS

## 2015-05-26 MED ORDER — DEXAMETHASONE SODIUM PHOSPHATE 10 MG/ML IJ SOLN: 10.0000 mg | Freq: Once | INTRAMUSCULAR | Status: DC

## 2015-05-26 NOTE — ED Notes (Signed)
Pt returned from MRI °

## 2015-05-26 NOTE — ED Provider Notes (Signed)
CSN: [id number]     Arrival date & time 05/26/15  1611 History   First MD Initiated Contact with Patient 05/26/15 1938     Chief Complaint  Patient presents with  . Aphasia  . Transient Ischemic Attack     (Consider location/radiation/quality/duration/timing/severity/associated sxs/prior Treatment) HPI Deshawn Skelley is a 69 y.o. female with history of diabetes, peripheral neuropathy, anemia, venous insufficiency and lower extremity ulcers, presents to emergency department complaining of increased slurred speech and aphasia for approximately week. Per family and patient, patient has had difficulty expressing words and numbers over the last several days. Patient was told she may have had a TIA approximately 3 months ago after she was confused for several minutes. Patient did not come to the ED at that time. Family states the patient has been slow to respond, difficulty writing texts, intermittently slurred speech. Patient states she was on tramadol and Tylenol for her pain, and was taken off of it, and states her symptoms were not affected. She denies starting on any other medications. She denies any fever or chills. No urinary symptoms. No nausea or vomiting. No chest pain or shortness of breath. No other complaints.  Past Medical History  Diagnosis Date  . Venous insufficiency   . Venous stasis ulcer   . Diabetes mellitus due to underlying condition with diabetic nephropathy   . Neuropathy   . DJD (degenerative joint disease)   . Anemia   . Nephropathy, diabetic    Past Surgical History  Procedure Laterality Date  . Tonsilectomy, adenoidectomy, bilateral myringotomy and tubes    . Mouth surgery    . I&d extremity Left 01/14/2014    Procedure: IRRIGATION AND DEBRIDEMENT LEFT LOWER EXTREMITY POSSIBLE ACELL AND POSSIBLE VAC PLACEMENT;  Surgeon: Theodoro Kos, DO;  Location: [facility];  Service: Plastics;  Laterality: Left;   Family History  Problem Relation Age of Onset  . Heart disease  Mother   . Heart disease Father   . Diabetes Maternal Grandmother    Social History  Substance Use Topics  . Smoking status: Never Smoker   . Smokeless tobacco: Never Used  . Alcohol Use: No   OB History    No data available     Review of Systems  Constitutional: Negative for fever and chills.  Respiratory: Negative for cough, chest tightness and shortness of breath.   Cardiovascular: Negative for chest pain, palpitations and leg swelling.  Gastrointestinal: Negative for nausea, vomiting, abdominal pain and diarrhea.  Genitourinary: Negative for dysuria, flank pain and pelvic pain.  Musculoskeletal: Negative for myalgias, arthralgias, neck pain and neck stiffness.  Skin: Negative for rash.  Neurological: Positive for speech difficulty. Negative for dizziness, syncope, weakness, numbness and headaches.  All other systems reviewed and are negative.     Allergies  Bactrim; Sulfa antibiotics; Sulfamethoxazole-trimethoprim; Atorvastatin; Celebrex; Ibuprofen; Latex; Other; Pioglitazone; Triple antibiotic ; Vancomycin; Actos; and Neosporin  Home Medications   Prior to Admission medications   Medication Sig Start Date End Date Taking? Authorizing Provider  diphenoxylate-atropine (LOMOTIL) 2.5-0.025 MG per tablet Take 1 tablet by mouth 4 (four) times daily as needed for diarrhea or loose stools. 02/17/14   Delfina Redwood, MD  glipiZIDE (GLUCOTROL) 10 MG tablet Take 10 mg by mouth daily before breakfast.     Historical Provider, MD  hydrochlorothiazide 25 MG tablet Take 25 mg by mouth daily.     Historical Provider, MD  hydrOXYzine (ATARAX/VISTARIL) 50 MG tablet Take 50 mg by mouth 3 (three) times daily as  needed for itching.     Historical Provider, MD  insulin NPH-insulin regular (NOVOLIN 70/30) (70-30) 100 UNIT/ML injection Inject 80 Units into the skin 2 (two) times daily with a meal.     Historical Provider, MD  insulin regular (NOVOLIN R,HUMULIN R) 100 units/mL injection Inject  0-20 Units into the skin daily as needed for high blood sugar. Sliding scale    Historical Provider, MD  iron polysaccharides (NIFEREX) 150 MG capsule Take 150 mg by mouth 2 (two) times daily.     Historical Provider, MD  mupirocin ointment (BACTROBAN) 2 % Apply 1 application topically daily. 11/14/13   Historical Provider, MD  OVER THE COUNTER MEDICATION Take 1 tablet by mouth 2 (two) times daily. [place]    Historical Provider, MD  potassium chloride (K-DUR,KLOR-CON) 10 MEQ tablet Take 10 mEq by mouth 2 (two) times daily.    Historical Provider, MD  timolol (TIMOPTIC) 0.5 % ophthalmic solution Place 1 drop into both eyes daily. 10/16/13   Historical Provider, MD  traMADol (ULTRAM) 50 MG tablet Take 50 mg by mouth every 6 (six) hours as needed for moderate pain.     Historical Provider, MD  vitamin C (ASCORBIC ACID) 500 MG tablet Take 500 mg by mouth 2 (two) times daily.     Historical Provider, MD   BP 127/70 mmHg  Pulse 61  Temp(Src) 98.1 F (36.7 C) (Oral)  Resp 24  Ht 5\' 6"  (1.676 m)  Wt 294 lb (133.358 kg)  BMI 47.48 kg/m2  SpO2 100% Physical Exam  Constitutional: She is oriented to person, place, and time. She appears well-developed and well-nourished. No distress.  HENT:  Head: Normocephalic.  Eyes: Conjunctivae are normal.  Neck: Neck supple.  Cardiovascular: Normal rate, regular rhythm and normal heart sounds.   Pulmonary/Chest: Effort normal and breath sounds normal. No respiratory distress. She has no wheezes. She has no rales.  Abdominal: Soft. Bowel sounds are normal. She exhibits no distension. There is no tenderness. There is no rebound.  Musculoskeletal: She exhibits edema.  Bilateral LE swelling. Una boots in place  Neurological: She is alert and oriented to person, place, and time. She displays normal reflexes. No cranial nerve deficit. She exhibits normal muscle tone.  5/5 and equal upper and lower extremity strength bilaterally. Equal grip strength bilaterally. Normal  finger to nose and heel to shin. No pronator drift. Speech normal  Skin: Skin is warm and dry.  Psychiatric: She has a normal mood and affect. Her behavior is normal.  Nursing note and vitals reviewed.   ED Course  Procedures (including critical care time) Labs Review Labs Reviewed  PROTIME-INR - Abnormal; Notable for the following:    Prothrombin Time 16.1 (*)    All other components within normal limits  APTT - Abnormal; Notable for the following:    aPTT 38 (*)    All other components within normal limits  CBC - Abnormal; Notable for the following:    RBC 3.82 (*)    Hemoglobin 11.0 (*)    HCT 32.0 (*)    All other components within normal limits  COMPREHENSIVE METABOLIC PANEL - Abnormal; Notable for the following:    Potassium 2.9 (*)    Glucose, Bld 165 (*)    Creatinine, Ser 1.64 (*)    Total Bilirubin 1.5 (*)    GFR calc non Af Amer 31 (*)    GFR calc Af Amer 36 (*)    All other components within normal limits  CBG MONITORING,  ED - Abnormal; Notable for the following:    Glucose-Capillary 157 (*)    All other components within normal limits  I-STAT CHEM 8, ED - Abnormal; Notable for the following:    Potassium 2.9 (*)    Creatinine, Ser 1.60 (*)    Glucose, Bld 163 (*)    Hemoglobin 11.6 (*)    HCT 34.0 (*)    All other components within normal limits  DIFFERENTIAL  I-STAT TROPOININ, ED    Imaging Review Ct Head Wo Contrast  05/26/2015   CLINICAL DATA:  History of a TIA. Slurred speech and mental status change.  EXAM: CT HEAD WITHOUT CONTRAST  TECHNIQUE: Contiguous axial images were obtained from the base of the skull through the vertex without intravenous contrast.  COMPARISON:  None.  FINDINGS: Areas of low attenuation and moderate expansion in the splenium of the corpus callosum along with tumoral edema in the left parietal lobe. Findings suspicious for lymphoma or possible glioma. Recommend MRI brain without and with contrast for further evaluation.  No  findings for hemispheric infarction or intracranial hemorrhage. No extra-axial fluid collections  No significant bony findings. The paranasal sinuses and mastoid air cells are clear. The globes are intact.  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 Findings could be due to lymphoma or glioma. Recommend MRI brain without and with contrast for further evaluation.   Electronically Signed   By: Marijo Sanes M.D.   On: 05/26/2015 19:07   I have personally reviewed and evaluated these images and lab results as part of my medical decision-making.   EKG Interpretation   Date/Time:  Wednesday May 26 2015 17:01:44 EDT Ventricular Rate:  63 PR Interval:    QRS Duration: 96 QT Interval:  460 QTC Calculation: 470 R Axis:   72 Text Interpretation:  Atrial fibrillation Abnormal ECG Confirmed by COOK   MD, BRIAN ([id number]) on 05/26/2015 9:16:35 PM      MDM   Final diagnoses:  Brain lesion  Aphasia   Patient with worsening speech, intermittent slurred speech, difficulty finding words and naming numbers.concerning for possible TIA versus stroke. No other focal neuro deficits. Will get CT, labs.   Patient's CT showing several lesions, MRI of the brain is recommended. Will get MRI brain.   Patient's MRI showed 8 supratentorial enhancing masses. This is concerning for possible multifocal glioma versus CNS lymphoma. I discussed findings with Dr. Leonel Ramsay with neurology, he recommended calling neurosurgery to see with a next step in this diagnosis is.  I spoke with Dr. Cyndy Freeze, with neurosurgery, he informed me that patient will need high-volume lumbar puncture with cytology and fluorocytometry to rule out CNS lymphoma  I spoke with Dr. Leonel Ramsay again, he stated that patient simply needs a lumbar puncture and this fluid needs to be sent for cytology and fluoroscopy cytometry. Patient may need to be admitted  for further evaluation and treatment. I discussed results with patient and her daughter. The patient is adamant at this time that she does not want any tests done. She understands that these masses could be cancerous, and at this point she is unsure if she wants any further diagnosis or treatment. Daughter supportive of her decision. Patient wants to go home at this time, she will discuss with her family and decide what she wants to do. She will call her primary care doctor when she makes that decision.  Filed Vitals:   05/26/15  2230 05/26/15 2245 05/26/15 2300 05/26/15 2315  BP: 146/61 121/57 119/53 117/48  Pulse: 68 72 64 62  Temp:      TempSrc:      Resp: 20 25 17 16   Height:      Weight:      SpO2: 100% 98% 99% 100%       Jeannett Senior, PA-C 05/27/15 0116  Nat Christen, MD 05/27/15 1209

## 2015-05-26 NOTE — ED Notes (Signed)
Pt and family c/o increasing slurring of speech for approx 1 week, with difficulty expressing words and numbers. Pt states she had a TIA about 3 months ago-- but was not seen at a hospital-- Dr. Kenton Kingfisher at [facility] physicians told pt per symptoms that is what she had.

## 2015-05-27 NOTE — Discharge Instructions (Signed)
Please follow up with primary care doctor. Return to ER if worsening symptoms.

## 2015-06-07 DIAGNOSIS — L97821 Non-pressure chronic ulcer of other part of left lower leg limited to breakdown of skin: Secondary | ICD-10-CM | POA: Diagnosis not present

## 2015-06-07 DIAGNOSIS — I83028 Varicose veins of left lower extremity with ulcer other part of lower leg: Secondary | ICD-10-CM | POA: Diagnosis present

## 2015-06-07 DIAGNOSIS — Z6841 Body Mass Index (BMI) 40.0 and over, adult: Secondary | ICD-10-CM | POA: Diagnosis not present

## 2015-06-07 DIAGNOSIS — I890 Lymphedema, not elsewhere classified: Secondary | ICD-10-CM | POA: Diagnosis not present

## 2015-06-07 DIAGNOSIS — E669 Obesity, unspecified: Secondary | ICD-10-CM | POA: Diagnosis not present

## 2015-06-07 DIAGNOSIS — R4701 Aphasia: Secondary | ICD-10-CM | POA: Diagnosis not present

## 2015-06-07 DIAGNOSIS — E11622 Type 2 diabetes mellitus with other skin ulcer: Secondary | ICD-10-CM | POA: Diagnosis not present

## 2015-06-24 ENCOUNTER — Other Ambulatory Visit (HOSPITAL_COMMUNITY): Payer: Self-pay | Admitting: Neurosurgery

## 2015-06-24 ENCOUNTER — Ambulatory Visit (HOSPITAL_COMMUNITY)
Admission: RE | Admit: 2015-06-24 | Discharge: 2015-06-24 | Disposition: A | Discharge status: Home / Self Care | Payer: Medicare Other | Source: Ambulatory Visit | Attending: Neurosurgery | Admitting: Neurosurgery

## 2015-06-24 ENCOUNTER — Encounter (HOSPITAL_COMMUNITY): Payer: Self-pay | Admitting: *Deleted

## 2015-06-24 DIAGNOSIS — D496 Neoplasm of unspecified behavior of brain: Secondary | ICD-10-CM

## 2015-06-24 DIAGNOSIS — I2510 Atherosclerotic heart disease of native coronary artery without angina pectoris: Secondary | ICD-10-CM | POA: Insufficient documentation

## 2015-06-24 DIAGNOSIS — K8020 Calculus of gallbladder without cholecystitis without obstruction: Secondary | ICD-10-CM | POA: Insufficient documentation

## 2015-06-24 LAB — CREATININE, SERUM
CREATININE: 1.51 mg/dL — AB (ref 0.44–1.00)
GFR calc Af Amer: 39 mL/min — ABNORMAL LOW (ref >60)
GFR, EST NON AFRICAN AMERICAN: 34 mL/min — AB (ref >60)

## 2015-06-24 LAB — BUN: BUN: 36 mg/dL — ABNORMAL HIGH (ref 6–20)

## 2015-06-24 MED ORDER — GADOBENATE DIMEGLUMINE 529 MG/ML IV SOLN
10.0000 mL | Freq: Once | INTRAVENOUS | Status: AC | PRN
  Administered 2015-06-24: 10 mL via INTRAVENOUS

## 2015-06-24 MED ORDER — IOHEXOL 300 MG/ML  SOLN
75.0000 mL | Freq: Once | INTRAMUSCULAR | Status: AC | PRN
  Administered 2015-06-24: 75 mL via INTRAVENOUS

## 2015-06-24 MED ORDER — BARIUM SULFATE 2.1 % PO SUSP
ORAL | Status: AC
  Filled 2015-06-24: qty 2

## 2015-06-24 NOTE — Progress Notes (Signed)
   06/24/15 1958  OBSTRUCTIVE SLEEP APNEA  Have you ever been diagnosed with sleep apnea through a sleep study? No  Do you snore loudly (loud enough to be heard through closed doors)?  1  Do you often feel tired, fatigued, or sleepy during the daytime (such as falling asleep during driving or talking to someone)? 0  Has anyone observed you stop breathing during your sleep? 1  Do you have, or are you being treated for high blood pressure? 1  BMI more than 35 kg/m2? 1  Age > 40 (1-yes) 1  Female Gender (Yes=1) 0  Obstructive Sleep Apnea Score 5  Score 5 or greater  Results sent to PCP

## 2015-06-24 NOTE — Progress Notes (Signed)
Spoke with pt for pre-op call, she requested that I call her daughter Gregary Signs and give her the instructions. I called Gregary Signs and was able to give her the instructions.

## 2015-06-25 ENCOUNTER — Inpatient Hospital Stay (HOSPITAL_COMMUNITY): Payer: Medicare Other | Admitting: Certified Registered"

## 2015-06-25 ENCOUNTER — Other Ambulatory Visit: Payer: Self-pay | Admitting: Neurosurgery

## 2015-06-25 ENCOUNTER — Encounter (HOSPITAL_COMMUNITY)
Admission: RE | Disposition: A | Discharge status: Home / Self Care | Payer: Self-pay | Source: Ambulatory Visit | Attending: Neurosurgery

## 2015-06-25 ENCOUNTER — Encounter (HOSPITAL_COMMUNITY): Payer: Self-pay | Admitting: *Deleted

## 2015-06-25 ENCOUNTER — Observation Stay (HOSPITAL_COMMUNITY)
Admission: RE | Admit: 2015-06-25 | Discharge: 2015-06-26 | Disposition: A | Discharge status: Home / Self Care | Payer: Medicare Other | Source: Ambulatory Visit | Attending: Neurosurgery | Admitting: Neurosurgery

## 2015-06-25 DIAGNOSIS — Z91040 Latex allergy status: Secondary | ICD-10-CM | POA: Diagnosis not present

## 2015-06-25 DIAGNOSIS — Z886 Allergy status to analgesic agent status: Secondary | ICD-10-CM | POA: Insufficient documentation

## 2015-06-25 DIAGNOSIS — D496 Neoplasm of unspecified behavior of brain: Principal | ICD-10-CM | POA: Insufficient documentation

## 2015-06-25 DIAGNOSIS — C711 Malignant neoplasm of frontal lobe: Secondary | ICD-10-CM | POA: Diagnosis present

## 2015-06-25 DIAGNOSIS — Z881 Allergy status to other antibiotic agents status: Secondary | ICD-10-CM | POA: Insufficient documentation

## 2015-06-25 DIAGNOSIS — Z794 Long term (current) use of insulin: Secondary | ICD-10-CM | POA: Diagnosis not present

## 2015-06-25 DIAGNOSIS — M50221 Other cervical disc displacement at C4-C5 level: Secondary | ICD-10-CM | POA: Diagnosis not present

## 2015-06-25 DIAGNOSIS — I739 Peripheral vascular disease, unspecified: Secondary | ICD-10-CM | POA: Insufficient documentation

## 2015-06-25 DIAGNOSIS — D649 Anemia, unspecified: Secondary | ICD-10-CM | POA: Diagnosis not present

## 2015-06-25 DIAGNOSIS — Z882 Allergy status to sulfonamides status: Secondary | ICD-10-CM | POA: Insufficient documentation

## 2015-06-25 DIAGNOSIS — E1121 Type 2 diabetes mellitus with diabetic nephropathy: Secondary | ICD-10-CM | POA: Insufficient documentation

## 2015-06-25 DIAGNOSIS — G939 Disorder of brain, unspecified: Secondary | ICD-10-CM | POA: Diagnosis present

## 2015-06-25 DIAGNOSIS — Z888 Allergy status to other drugs, medicaments and biological substances status: Secondary | ICD-10-CM | POA: Diagnosis not present

## 2015-06-25 DIAGNOSIS — M1990 Unspecified osteoarthritis, unspecified site: Secondary | ICD-10-CM | POA: Insufficient documentation

## 2015-06-25 DIAGNOSIS — K8020 Calculus of gallbladder without cholecystitis without obstruction: Secondary | ICD-10-CM | POA: Insufficient documentation

## 2015-06-25 DIAGNOSIS — I4891 Unspecified atrial fibrillation: Secondary | ICD-10-CM | POA: Insufficient documentation

## 2015-06-25 DIAGNOSIS — C55 Malignant neoplasm of uterus, part unspecified: Secondary | ICD-10-CM | POA: Diagnosis not present

## 2015-06-25 HISTORY — DX: Unspecified glaucoma: H40.9

## 2015-06-25 HISTORY — DX: Depression, unspecified: F32.A

## 2015-06-25 HISTORY — DX: Malignant (primary) neoplasm, unspecified: C80.1

## 2015-06-25 HISTORY — PX: BRAIN BIOPSY: SHX6409

## 2015-06-25 HISTORY — DX: Major depressive disorder, single episode, unspecified: F32.9

## 2015-06-25 HISTORY — DX: Unspecified convulsions: R56.9

## 2015-06-25 HISTORY — DX: Unspecified atrial fibrillation: I48.91

## 2015-06-25 HISTORY — DX: Constipation, unspecified: K59.00

## 2015-06-25 LAB — CBC
HEMATOCRIT: 38.2 % (ref 36.0–46.0)
HEMOGLOBIN: 13.8 g/dL (ref 12.0–15.0)
MCH: 29.4 pg (ref 26.0–34.0)
MCHC: 36.1 g/dL — ABNORMAL HIGH (ref 30.0–36.0)
MCV: 81.3 fL (ref 78.0–100.0)
Platelets: 276 10*3/uL (ref 150–400)
RBC: 4.7 MIL/uL (ref 3.87–5.11)
RDW: 14.1 % (ref 11.5–15.5)
WBC: 23 10*3/uL — ABNORMAL HIGH (ref 4.0–10.5)

## 2015-06-25 LAB — BASIC METABOLIC PANEL
ANION GAP: 9 (ref 5–15)
BUN: 33 mg/dL — ABNORMAL HIGH (ref 6–20)
CHLORIDE: 93 mmol/L — AB (ref 101–111)
CO2: 28 mmol/L (ref 22–32)
CREATININE: 1.41 mg/dL — AB (ref 0.44–1.00)
Calcium: 8.4 mg/dL — ABNORMAL LOW (ref 8.9–10.3)
GFR calc non Af Amer: 37 mL/min — ABNORMAL LOW (ref >60)
GFR, EST AFRICAN AMERICAN: 43 mL/min — AB (ref >60)
GLUCOSE: 201 mg/dL — AB (ref 65–99)
Potassium: 3 mmol/L — ABNORMAL LOW (ref 3.5–5.1)
Sodium: 130 mmol/L — ABNORMAL LOW (ref 135–145)

## 2015-06-25 LAB — GLUCOSE, CAPILLARY
GLUCOSE-CAPILLARY: 44 mg/dL — AB (ref 65–99)
GLUCOSE-CAPILLARY: 141 mg/dL — AB (ref 65–99)
GLUCOSE-CAPILLARY: 86 mg/dL (ref 65–99)
GLUCOSE-CAPILLARY: 62 mg/dL — AB (ref 65–99)
Glucose-Capillary: 79 mg/dL (ref 65–99)
Glucose-Capillary: 156 mg/dL — ABNORMAL HIGH (ref 65–99)

## 2015-06-25 LAB — SURGICAL PCR SCREEN
MRSA, PCR: POSITIVE — AB
STAPHYLOCOCCUS AUREUS: POSITIVE — AB

## 2015-06-25 SURGERY — BRAIN BIOPSY
Anesthesia: General | Site: Head | Laterality: Right

## 2015-06-25 MED ORDER — ONDANSETRON HCL 4 MG PO TABS: 4.0000 mg | ORAL_TABLET | ORAL | Status: DC | PRN

## 2015-06-25 MED ORDER — HYDROCODONE-ACETAMINOPHEN 5-325 MG PO TABS: 1.0000 | ORAL_TABLET | ORAL | Status: DC | PRN

## 2015-06-25 MED ORDER — DEXAMETHASONE 4 MG PO TABS
4.0000 mg | ORAL_TABLET | Freq: Two times a day (BID) | ORAL | Status: DC
  Administered 2015-06-25 – 2015-06-26 (×2): 4 mg via ORAL
  Filled 2015-06-25 (×2): qty 1

## 2015-06-25 MED ORDER — CEFAZOLIN SODIUM-DEXTROSE 2-3 GM-% IV SOLR
INTRAVENOUS | Status: DC | PRN
Start: 2015-06-25 — End: 2015-06-25
  Administered 2015-06-25: 2 g via INTRAVENOUS

## 2015-06-25 MED ORDER — POLYSACCHARIDE IRON COMPLEX 150 MG PO CAPS
150.0000 mg | ORAL_CAPSULE | Freq: Two times a day (BID) | ORAL | Status: DC
  Filled 2015-06-25 (×3): qty 1

## 2015-06-25 MED ORDER — DEXTROSE 50 % IV SOLN
INTRAVENOUS | Status: AC
  Filled 2015-06-25: qty 50

## 2015-06-25 MED ORDER — PROPOFOL 10 MG/ML IV BOLUS
INTRAVENOUS | Status: AC
  Filled 2015-06-25: qty 20

## 2015-06-25 MED ORDER — VITAMIN C 500 MG PO TABS
500.0000 mg | ORAL_TABLET | Freq: Two times a day (BID) | ORAL | Status: DC
  Administered 2015-06-25 – 2015-06-26 (×2): 500 mg via ORAL
  Filled 2015-06-25 (×2): qty 1

## 2015-06-25 MED ORDER — EPHEDRINE SULFATE 50 MG/ML IJ SOLN
INTRAMUSCULAR | Status: DC | PRN
  Administered 2015-06-25: 10 mg via INTRAVENOUS

## 2015-06-25 MED ORDER — ONDANSETRON HCL 4 MG/2ML IJ SOLN
INTRAMUSCULAR | Status: DC | PRN
  Administered 2015-06-25: 4 mg via INTRAVENOUS

## 2015-06-25 MED ORDER — PHENYLEPHRINE HCL 10 MG/ML IJ SOLN
INTRAMUSCULAR | Status: DC | PRN
  Administered 2015-06-25: 40 ug via INTRAVENOUS

## 2015-06-25 MED ORDER — LIDOCAINE-EPINEPHRINE 1 %-1:100000 IJ SOLN
INTRAMUSCULAR | Status: DC | PRN
  Administered 2015-06-25: 1.5 mL

## 2015-06-25 MED ORDER — TIMOLOL MALEATE 0.5 % OP SOLN
1.0000 [drp] | Freq: Two times a day (BID) | OPHTHALMIC | Status: DC
Start: 2015-06-25 — End: 2015-06-26
  Filled 2015-06-25: qty 5

## 2015-06-25 MED ORDER — INSULIN ASPART 100 UNIT/ML ~~LOC~~ SOLN
0.0000 [IU] | Freq: Three times a day (TID) | SUBCUTANEOUS | Status: DC
  Administered 2015-06-26: 15 [IU] via SUBCUTANEOUS

## 2015-06-25 MED ORDER — MUPIROCIN 2 % EX OINT
TOPICAL_OINTMENT | CUTANEOUS | Status: AC
  Filled 2015-06-25: qty 22

## 2015-06-25 MED ORDER — SODIUM CHLORIDE 0.9 % IV SOLN: INTRAVENOUS | Status: DC

## 2015-06-25 MED ORDER — LACTATED RINGERS IV SOLN: INTRAVENOUS | Status: DC

## 2015-06-25 MED ORDER — TRAMADOL HCL 50 MG PO TABS
50.0000 mg | ORAL_TABLET | Freq: Four times a day (QID) | ORAL | Status: DC | PRN
  Administered 2015-06-26: 50 mg via ORAL
  Filled 2015-06-25: qty 1

## 2015-06-25 MED ORDER — GLYCOPYRROLATE 0.2 MG/ML IJ SOLN
INTRAMUSCULAR | Status: DC | PRN
  Administered 2015-06-25: 0.6 mg via INTRAVENOUS

## 2015-06-25 MED ORDER — LEVETIRACETAM 500 MG PO TABS
500.0000 mg | ORAL_TABLET | Freq: Two times a day (BID) | ORAL | Status: DC
  Administered 2015-06-25 – 2015-06-26 (×2): 500 mg via ORAL
  Filled 2015-06-25 (×2): qty 1

## 2015-06-25 MED ORDER — 0.9 % SODIUM CHLORIDE (POUR BTL) OPTIME
TOPICAL | Status: DC | PRN
  Administered 2015-06-25 (×2): 1000 mL

## 2015-06-25 MED ORDER — PANTOPRAZOLE SODIUM 40 MG IV SOLR: 40.0000 mg | Freq: Every day | INTRAVENOUS | Status: DC

## 2015-06-25 MED ORDER — NEOSTIGMINE METHYLSULFATE 10 MG/10ML IV SOLN
INTRAVENOUS | Status: DC | PRN
  Administered 2015-06-25: 4 mg via INTRAVENOUS

## 2015-06-25 MED ORDER — PROMETHAZINE HCL 25 MG PO TABS: 12.5000 mg | ORAL_TABLET | ORAL | Status: DC | PRN

## 2015-06-25 MED ORDER — LABETALOL HCL 5 MG/ML IV SOLN: 10.0000 mg | INTRAVENOUS | Status: DC | PRN

## 2015-06-25 MED ORDER — HYDROXYZINE HCL 25 MG PO TABS: 50.0000 mg | ORAL_TABLET | Freq: Three times a day (TID) | ORAL | Status: DC | PRN

## 2015-06-25 MED ORDER — THROMBIN 20000 UNITS EX SOLR
CUTANEOUS | Status: DC | PRN
  Administered 2015-06-25: 16:00:00 via TOPICAL

## 2015-06-25 MED ORDER — BISMUTH SUBSALICYLATE 262 MG/15ML PO SUSP: 7.5000 mL | Freq: Four times a day (QID) | ORAL | Status: DC | PRN

## 2015-06-25 MED ORDER — SODIUM CHLORIDE 0.9 % IV SOLN
INTRAVENOUS | Status: DC | PRN
  Administered 2015-06-25 (×2): via INTRAVENOUS

## 2015-06-25 MED ORDER — ROCURONIUM BROMIDE 100 MG/10ML IV SOLN
INTRAVENOUS | Status: DC | PRN
  Administered 2015-06-25: 25 mg via INTRAVENOUS
  Administered 2015-06-25: 15 mg via INTRAVENOUS

## 2015-06-25 MED ORDER — BUPIVACAINE HCL (PF) 0.5 % IJ SOLN
INTRAMUSCULAR | Status: DC | PRN
  Administered 2015-06-25: 1.5 mL

## 2015-06-25 MED ORDER — DIPHENOXYLATE-ATROPINE 2.5-0.025 MG PO TABS: 1.0000 | ORAL_TABLET | Freq: Four times a day (QID) | ORAL | Status: DC | PRN

## 2015-06-25 MED ORDER — PROMETHAZINE HCL 25 MG/ML IJ SOLN: 6.2500 mg | INTRAMUSCULAR | Status: DC | PRN

## 2015-06-25 MED ORDER — DEXTROSE 50 % IV SOLN
1.0000 | Freq: Once | INTRAVENOUS | Status: AC
  Administered 2015-06-25: 50 mL via INTRAVENOUS

## 2015-06-25 MED ORDER — HYDROCHLOROTHIAZIDE 25 MG PO TABS
25.0000 mg | ORAL_TABLET | Freq: Every day | ORAL | Status: DC
  Administered 2015-06-25 – 2015-06-26 (×2): 25 mg via ORAL
  Filled 2015-06-25 (×2): qty 1

## 2015-06-25 MED ORDER — NYSTATIN 100000 UNIT/GM EX CREA
1.0000 "application " | TOPICAL_CREAM | Freq: Two times a day (BID) | CUTANEOUS | Status: DC
  Filled 2015-06-25: qty 15

## 2015-06-25 MED ORDER — SENNA 8.6 MG PO TABS
1.0000 | ORAL_TABLET | Freq: Two times a day (BID) | ORAL | Status: DC
  Administered 2015-06-25 – 2015-06-26 (×2): 8.6 mg via ORAL
  Filled 2015-06-25 (×2): qty 1

## 2015-06-25 MED ORDER — PROPOFOL 10 MG/ML IV BOLUS
INTRAVENOUS | Status: DC | PRN
  Administered 2015-06-25: 150 mg via INTRAVENOUS
  Administered 2015-06-25: 50 mg via INTRAVENOUS

## 2015-06-25 MED ORDER — HYDROMORPHONE HCL 1 MG/ML IJ SOLN: 0.2500 mg | INTRAMUSCULAR | Status: DC | PRN

## 2015-06-25 MED ORDER — FENTANYL CITRATE (PF) 250 MCG/5ML IJ SOLN
INTRAMUSCULAR | Status: DC | PRN
  Administered 2015-06-25: 50 ug via INTRAVENOUS

## 2015-06-25 MED ORDER — POTASSIUM CHLORIDE CRYS ER 10 MEQ PO TBCR
10.0000 meq | EXTENDED_RELEASE_TABLET | Freq: Two times a day (BID) | ORAL | Status: DC
  Administered 2015-06-25 – 2015-06-26 (×2): 10 meq via ORAL
  Filled 2015-06-25 (×2): qty 1

## 2015-06-25 MED ORDER — BUMETANIDE 1 MG PO TABS
1.0000 mg | ORAL_TABLET | Freq: Every day | ORAL | Status: DC
  Administered 2015-06-25 – 2015-06-26 (×2): 1 mg via ORAL
  Filled 2015-06-25 (×2): qty 1

## 2015-06-25 MED ORDER — SODIUM CHLORIDE 0.9 % IR SOLN
Status: DC | PRN
  Administered 2015-06-25: 16:00:00

## 2015-06-25 MED ORDER — INSULIN ASPART PROT & ASPART (70-30 MIX) 100 UNIT/ML ~~LOC~~ SUSP
80.0000 [IU] | Freq: Two times a day (BID) | SUBCUTANEOUS | Status: DC
  Administered 2015-06-26: 80 [IU] via SUBCUTANEOUS
  Filled 2015-06-25: qty 10

## 2015-06-25 MED ORDER — DOCUSATE SODIUM 100 MG PO CAPS
100.0000 mg | ORAL_CAPSULE | Freq: Two times a day (BID) | ORAL | Status: DC
  Administered 2015-06-25 – 2015-06-26 (×2): 100 mg via ORAL
  Filled 2015-06-25: qty 1

## 2015-06-25 MED ORDER — LIDOCAINE HCL (CARDIAC) 20 MG/ML IV SOLN
INTRAVENOUS | Status: DC | PRN
  Administered 2015-06-25: 80 mg via INTRAVENOUS

## 2015-06-25 MED ORDER — BACID PO TABS
1.0000 | ORAL_TABLET | Freq: Every day | ORAL | Status: DC
Start: 2015-06-25 — End: 2015-06-26
  Administered 2015-06-25 – 2015-06-26 (×2): 1 via ORAL
  Filled 2015-06-25 (×2): qty 1

## 2015-06-25 MED ORDER — MUPIROCIN 2 % EX OINT
1.0000 "application " | TOPICAL_OINTMENT | Freq: Once | CUTANEOUS | Status: AC
  Administered 2015-06-25: 1 via TOPICAL

## 2015-06-25 MED ORDER — LATANOPROST 0.005 % OP SOLN
1.0000 [drp] | Freq: Every day | OPHTHALMIC | Status: DC
  Administered 2015-06-25: 1 [drp] via OPHTHALMIC
  Filled 2015-06-25: qty 2.5

## 2015-06-25 MED ORDER — FENTANYL CITRATE (PF) 250 MCG/5ML IJ SOLN
INTRAMUSCULAR | Status: AC
  Filled 2015-06-25: qty 5

## 2015-06-25 MED ORDER — PANTOPRAZOLE SODIUM 40 MG PO TBEC
40.0000 mg | DELAYED_RELEASE_TABLET | Freq: Every day | ORAL | Status: DC
  Administered 2015-06-26: 40 mg via ORAL
  Filled 2015-06-25: qty 1

## 2015-06-25 MED ORDER — BACITRACIN ZINC 500 UNIT/GM EX OINT
TOPICAL_OINTMENT | CUTANEOUS | Status: DC | PRN
  Administered 2015-06-25: 1 via TOPICAL

## 2015-06-25 MED ORDER — GLIPIZIDE 10 MG PO TABS
10.0000 mg | ORAL_TABLET | Freq: Two times a day (BID) | ORAL | Status: DC
  Administered 2015-06-26: 10 mg via ORAL
  Filled 2015-06-25 (×3): qty 1

## 2015-06-25 MED ORDER — ADULT MULTIVITAMIN W/MINERALS CH
1.0000 | ORAL_TABLET | Freq: Every day | ORAL | Status: DC
  Administered 2015-06-25 – 2015-06-26 (×2): 1 via ORAL
  Filled 2015-06-25 (×3): qty 1

## 2015-06-25 MED ORDER — BISACODYL 10 MG RE SUPP: 10.0000 mg | Freq: Every day | RECTAL | Status: DC | PRN

## 2015-06-25 MED ORDER — ONDANSETRON HCL 4 MG/2ML IJ SOLN: 4.0000 mg | INTRAMUSCULAR | Status: DC | PRN

## 2015-06-25 SURGICAL SUPPLY — 80 items
BENZOIN TINCTURE PRP APPL 2/3 (GAUZE/BANDAGES/DRESSINGS) IMPLANT
BLADE CLIPPER SURG (BLADE) ×3 IMPLANT
BLADE SURG 15 STRL LF DISP TIS (BLADE) IMPLANT
BLADE SURG 15 STRL SS (BLADE)
BLADE ULTRA TIP 2M (BLADE) ×3 IMPLANT
BNDG GAUZE ELAST 4 BULKY (GAUZE/BANDAGES/DRESSINGS) IMPLANT
BUR ACORN 6.0 PRECISION (BURR) ×2 IMPLANT
BUR ACORN 6.0MM PRECISION (BURR) ×1
BUR ADDG 1.1 (BURR) IMPLANT
BUR ADDG 1.1MM (BURR)
BUR MATCHSTICK NEURO 3.0 LAGG (BURR) IMPLANT
BUR SPIRAL ROUTER 2.3 (BUR) IMPLANT
BUR SPIRAL ROUTER 2.3MM (BUR)
CANISTER SUCT 3000ML PPV (MISCELLANEOUS) ×3 IMPLANT
CLIP TI MEDIUM 6 (CLIP) IMPLANT
CORDS BIPOLAR (ELECTRODE) ×3 IMPLANT
COVER MAYO STAND STRL (DRAPES) IMPLANT
DECANTER SPIKE VIAL GLASS SM (MISCELLANEOUS) ×3 IMPLANT
DRAIN SNY WOU 7FLT (WOUND CARE) IMPLANT
DRAIN SUBARACHNOID (WOUND CARE) IMPLANT
DRAPE MICROSCOPE LEICA (MISCELLANEOUS) IMPLANT
DRAPE NEUROLOGICAL W/INCISE (DRAPES) ×3 IMPLANT
DRAPE ORTHO SPLIT 77X108 STRL (DRAPES)
DRAPE STERI IOBAN 125X83 (DRAPES) IMPLANT
DRAPE SURG 17X23 STRL (DRAPES) IMPLANT
DRAPE SURG IRRIG POUCH 19X23 (DRAPES) IMPLANT
DRAPE SURG ORHT 6 SPLT 77X108 (DRAPES) IMPLANT
DRAPE WARM FLUID 44X44 (DRAPE) ×3 IMPLANT
DRSG TELFA 3X8 NADH (GAUZE/BANDAGES/DRESSINGS) ×3 IMPLANT
DURAPREP 6ML APPLICATOR 50/CS (WOUND CARE) ×3 IMPLANT
ELECT CAUTERY BLADE 6.4 (BLADE) ×3 IMPLANT
ELECT REM PT RETURN 9FT ADLT (ELECTROSURGICAL) ×3
ELECTRODE REM PT RTRN 9FT ADLT (ELECTROSURGICAL) ×1 IMPLANT
EVACUATOR 1/8 PVC DRAIN (DRAIN) IMPLANT
EVACUATOR SILICONE 100CC (DRAIN) IMPLANT
GAUZE SPONGE 4X4 12PLY STRL (GAUZE/BANDAGES/DRESSINGS) ×3 IMPLANT
GAUZE SPONGE 4X4 16PLY XRAY LF (GAUZE/BANDAGES/DRESSINGS) IMPLANT
GLOVE ECLIPSE 6.5 STRL STRAW (GLOVE) ×3 IMPLANT
GLOVE ECLIPSE 7.5 STRL STRAW (GLOVE) IMPLANT
GLOVE EXAM NITRILE LRG STRL (GLOVE) IMPLANT
GLOVE EXAM NITRILE MD LF STRL (GLOVE) IMPLANT
GLOVE EXAM NITRILE XL STR (GLOVE) IMPLANT
GLOVE EXAM NITRILE XS STR PU (GLOVE) IMPLANT
GOWN STRL REUS W/ TWL LRG LVL3 (GOWN DISPOSABLE) ×2 IMPLANT
GOWN STRL REUS W/ TWL XL LVL3 (GOWN DISPOSABLE) IMPLANT
GOWN STRL REUS W/TWL 2XL LVL3 (GOWN DISPOSABLE) IMPLANT
GOWN STRL REUS W/TWL LRG LVL3 (GOWN DISPOSABLE) ×4
GOWN STRL REUS W/TWL XL LVL3 (GOWN DISPOSABLE)
HEMOSTAT SURGICEL 2X14 (HEMOSTASIS) IMPLANT
KIT BASIN OR (CUSTOM PROCEDURE TRAY) ×3 IMPLANT
KIT ROOM TURNOVER OR (KITS) ×3 IMPLANT
NEEDLE HYPO 25X1 1.5 SAFETY (NEEDLE) ×3 IMPLANT
NEEDLE SPNL 18GX3.5 QUINCKE PK (NEEDLE) IMPLANT
NS IRRIG 1000ML POUR BTL (IV SOLUTION) ×3 IMPLANT
PACK CRANIOTOMY (CUSTOM PROCEDURE TRAY) ×3 IMPLANT
PATTIES SURGICAL .25X.25 (GAUZE/BANDAGES/DRESSINGS) IMPLANT
PATTIES SURGICAL .5 X.5 (GAUZE/BANDAGES/DRESSINGS) IMPLANT
PATTIES SURGICAL .5 X3 (DISPOSABLE) IMPLANT
PATTIES SURGICAL 1/4 X 3 (GAUZE/BANDAGES/DRESSINGS) IMPLANT
PATTIES SURGICAL 1X1 (DISPOSABLE) IMPLANT
PERFORATOR CRANIAL CHILD/PED (MISCELLANEOUS) ×3 IMPLANT
PLATE 1.5/0.5 13MM BURR HOLE (Plate) ×3 IMPLANT
RUBBERBAND STERILE (MISCELLANEOUS) IMPLANT
SCREW SELF DRILL HT 1.5/4MM (Screw) ×12 IMPLANT
SPECIMEN JAR SMALL (MISCELLANEOUS) IMPLANT
SPONGE NEURO XRAY DETECT 1X3 (DISPOSABLE) IMPLANT
SPONGE SURGIFOAM ABS GEL 100 (HEMOSTASIS) ×3 IMPLANT
STAPLER VISISTAT 35W (STAPLE) ×3 IMPLANT
SUT ETHILON 3 0 FSL (SUTURE) IMPLANT
SUT ETHILON 3 0 PS 1 (SUTURE) IMPLANT
SUT NURALON 4 0 TR CR/8 (SUTURE) ×9 IMPLANT
SUT SILK 0 TIES 10X30 (SUTURE) IMPLANT
SUT VIC AB 2-0 CT2 18 VCP726D (SUTURE) ×6 IMPLANT
SUT VIC AB 3-0 SH 8-18 (SUTURE) ×3 IMPLANT
SYR CONTROL 10ML LL (SYRINGE) ×3 IMPLANT
TOWEL OR 17X24 6PK STRL BLUE (TOWEL DISPOSABLE) ×3 IMPLANT
TOWEL OR 17X26 10 PK STRL BLUE (TOWEL DISPOSABLE) ×3 IMPLANT
TRAY FOLEY W/METER SILVER 14FR (SET/KITS/TRAYS/PACK) ×3 IMPLANT
UNDERPAD 30X30 INCONTINENT (UNDERPADS AND DIAPERS) ×3 IMPLANT
WATER STERILE IRR 1000ML POUR (IV SOLUTION) ×3 IMPLANT

## 2015-06-25 NOTE — Anesthesia Preprocedure Evaluation (Addendum)
Anesthesia Evaluation  Patient identified by MRN, date of birth, ID band Patient awake    Reviewed: Allergy & Precautions, NPO status , Patient's Chart, lab work & pertinent test results  Airway Mallampati: II  TM Distance: >3 FB Neck ROM: Full    Dental  (+) Dental Advisory Given   Pulmonary neg pulmonary ROS,    breath sounds clear to auscultation       Cardiovascular + Peripheral Vascular Disease  + dysrhythmias Atrial Fibrillation  Rhythm:Regular Rate:Normal     Neuro/Psych Seizures -,  PSYCHIATRIC DISORDERS Depression    GI/Hepatic negative GI ROS, Neg liver ROS,   Endo/Other  diabetes, Type 2  Renal/GU Renal disease     Musculoskeletal  (+) Arthritis ,   Abdominal   Peds  Hematology  (+) anemia ,   Anesthesia Other Findings   Reproductive/Obstetrics                            Anesthesia Physical Anesthesia Plan  ASA: III  Anesthesia Plan: General   Post-op Pain Management:    Induction: Intravenous  Airway Management Planned: Oral ETT  Additional Equipment: Arterial line  Intra-op Plan:   Post-operative Plan:   Informed Consent:   Dental advisory given  Plan Discussed with: CRNA, Anesthesiologist and Surgeon  Anesthesia Plan Comments:        Anesthesia Quick Evaluation

## 2015-06-25 NOTE — H&P (Signed)
CC:  Speech difficulty  HPI: [name] is a 69 y.o. female initially seen in the office with a complaint of speech difficulty and confusion. This has been going on for a few weeks. Ultimately, MRI brain was done which demonstrated a large corpus callosum lesion with a smaller left temporal lesion and multiple satellite lesions. 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  PMH: Past Medical History  Diagnosis Date  . Venous insufficiency   . Venous stasis ulcer ([place])   . 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 Neuropathy ([place])   . DJD (degenerative joint disease)   . Anemia   . 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 Atrial fibrillation ([place])   . Depression   . Seizures ([place])     only 1 due to dehydration   . Cancer [facility])     uterine cancer  . Constipation   . Glaucoma     PSH: Past Surgical History  Procedure Laterality Date  . Tonsilectomy, adenoidectomy, bilateral myringotomy and tubes    . Mouth surgery    . I&d extremity Left 01/14/2014    Procedure: IRRIGATION AND DEBRIDEMENT LEFT LOWER EXTREMITY POSSIBLE ACELL AND POSSIBLE VAC PLACEMENT;  Surgeon: Theodoro Kos, DO;  Location: [facility];  Service: Plastics;  Laterality: Left;  . Cervical ablation      SH: Social History  Substance Use Topics  . Smoking status: Never Smoker   . Smokeless tobacco: Never Used  . Alcohol Use: No    MEDS: Prior to Admission medications   Medication Sig Start Date End Date Taking? Authorizing Provider  acetaminophen (TYLENOL) 80 MG/0.8ML suspension Take 10 mg/kg by mouth every 4 (four) hours as needed for fever.   Yes Historical Provider, MD  b complex vitamins tablet Take 1 tablet by mouth daily.   Yes Historical Provider, MD  bumetanide (BUMEX) 2 MG tablet Take 1 mg by mouth daily.  02/22/15  Yes  Historical Provider, MD  COD LIVER OIL PO Take 1 capsule by mouth daily.   Yes Historical Provider, MD  CRANBERRY PO Take 1 tablet by mouth 2 (two) times daily.   Yes Historical Provider, MD  dexamethasone (DECADRON) 4 MG tablet Take 4 mg by mouth daily.   Yes Historical Provider, MD  glipiZIDE (GLUCOTROL) 10 MG tablet Take 10 mg by mouth 2 (two) times daily before a meal.    Yes Historical Provider, MD  hydrochlorothiazide 25 MG tablet Take 25 mg by mouth daily.    Yes Historical Provider, MD  hydrOXYzine (ATARAX/VISTARIL) 50 MG tablet Take 50 mg by mouth 3 (three) times daily as needed for itching.    Yes Historical Provider, MD  insulin NPH-insulin regular (NOVOLIN 70/30) (70-30) 100 UNIT/ML injection Inject 80 Units into the skin 2 (two) times daily with a meal. Sliding scale   Yes Historical Provider, MD  insulin regular (NOVOLIN R,HUMULIN R) 100 units/mL injection Inject 0-20 Units into the skin daily as needed for high blood sugar. Sliding scale   Yes Historical Provider, MD  iron polysaccharides (NIFEREX) 150 MG capsule Take 150 mg by mouth 2 (two) times daily.   Yes Historical Provider, MD  lactobacillus acidophilus (BACID) TABS tablet Take 1 tablet by mouth daily.   Yes Historical Provider, MD  latanoprost (XALATAN) 0.005 % ophthalmic solution Place 1 drop into both  eyes at bedtime. 04/27/15  Yes Historical Provider, MD  Multiple Vitamins-Minerals (MULTIVITAMIN WITH MINERALS) tablet Take 1 tablet by mouth daily.   Yes Historical Provider, MD  nystatin cream (MYCOSTATIN) Apply 1 application topically daily as needed for dry skin.   Yes Historical Provider, MD  OVER THE COUNTER MEDICATION Take 1 tablet by mouth 2 (two) times daily. [place]lorel[place]a   Yes Historical Provider, MD  potassium chloride (K-DUR,KLOR-CON) 10 MEQ tablet Take 10 mEq by mouth 2 (two) times daily.   Yes Historical Provider, MD  timolol (TIMOPTIC) 0.5 % ophthalmic solution Place 1 drop into both eyes 2 (two) times daily.  10/16/13   Yes Historical Provider, MD  traMADol (ULTRAM) 50 MG tablet Take 50 mg by mouth every 6 (six) hours as needed for moderate pain.   Yes Historical Provider, MD  vitamin C (ASCORBIC ACID) 500 MG tablet Take 500 mg by mouth 2 (two) times daily.    Yes Historical Provider, MD  bismuth subsalicylate (PEPTO BISMOL) 262 MG/15ML suspension Take 7.5 mLs by mouth every 6 (six) hours as needed for indigestion or diarrhea or loose stools.    Historical Provider, MD  diphenoxylate-atropine (LOMOTIL) 2.5-0.025 MG per tablet Take 1 tablet by mouth 4 (four) times daily as needed for diarrhea or loose stools. 02/17/14   Delfina Redwood, MD    ALLERGY: Allergies  Allergen Reactions  . Bactrim [Sulfamethoxazole-Trimethoprim] Shortness Of Breath    Depression based reactions, altered mentality  . Sulfa Antibiotics Shortness Of Breath and Swelling    Depression based reactions, altered mentality  . Vancomycin Shortness Of Breath, Itching, Swelling and Rash    Red man's syndrome  . Atorvastatin Itching and Other (See Comments)    myalgias  . Celebrex [Celecoxib] Swelling, Rash and Other (See Comments)  . Ibuprofen Hives and Rash  . Latex Rash  . Pioglitazone Rash and Swelling  . Neosporin [Neomycin-Bacitracin Zn-Polymyx] Rash    ROS: ROS  NEUROLOGIC EXAM: Awake, alert, oriented Memory and concentration grossly intact Speech dysarthric, (+) word finding difficulty, slgihtly broken, poor repetition. Naming intact CN grossly intact Motor exam: Upper Extremities Deltoid Bicep Tricep Grip  Right 5/5 5/5 5/5 5/5  Left 5/5 5/5 5/5 5/5   Lower Extremity IP Quad PF DF EHL  Right 5/5 5/5 5/5 5/5 5/5  Left 5/5 5/5 5/5 5/5 5/5   Sensation grossly intact to LT  IMPRESSION: - 69 y.o. 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  PLAN: - Proceed with stereotactic biopsy - ICU postop  I have reviewed the indications for biopsy, risks, benefits, and alternatives with the patient.  After all questions were answered, consent was obtained.

## 2015-06-25 NOTE — Anesthesia Procedure Notes (Signed)
Procedure Name: Intubation Date/Time: 06/25/2015 3:07 PM Performed by: Gaylene Brooks Pre-anesthesia Checklist: Patient identified, Timeout performed, Emergency Drugs available, Suction available and Patient being monitored Patient Re-evaluated:Patient Re-evaluated prior to inductionOxygen Delivery Method: Circle system utilized Preoxygenation: Pre-oxygenation with 100% oxygen Intubation Type: IV induction, Cricoid Pressure applied and Rapid sequence Laryngoscope Size: Mac and 3 Grade View: Grade I Tube type: Oral Tube size: 7.0 mm Number of attempts: 1 Airway Equipment and Method: Stylet Placement Confirmation: ETT inserted through vocal cords under direct vision,  breath sounds checked- equal and bilateral and positive ETCO2 Secured at: 21 cm Tube secured with: Tape Dental Injury: Teeth and Oropharynx as per pre-operative assessment

## 2015-06-25 NOTE — Transfer of Care (Signed)
Immediate Anesthesia Transfer of Care Note  Patient: [name]  Procedure(s) Performed: Procedure(s) with comments: Right parietal stereotactic brain biopsy (Right) - Right parietal stereotactic brain biopsy  Patient Location: PACU  Anesthesia Type:General  Level of Consciousness: awake, alert  and patient cooperative  Airway & Oxygen Therapy: Patient Spontanous Breathing and Patient connected to nasal cannula oxygen  Post-op Assessment: Report given to RN, Post -op Vital signs reviewed and stable and Patient moving all extremities  Post vital signs: Reviewed and stable  Last Vitals:  Filed Vitals:   06/25/15 1326  BP: 152/71  Pulse: 66  Temp: 36.7 C  Resp: 18    Complications: No apparent anesthesia complications

## 2015-06-25 NOTE — Progress Notes (Signed)
Pt has LLE venous ulcer that is wrapped with dressing from home. Pt states she is seen at home 3x/week for treatment of that ulcer.

## 2015-06-25 NOTE — Progress Notes (Signed)
Hypoglycemic Event  CBG: 62  Treatment: 1 can of ginger ale  Symptoms: felt shaky  Follow-up CBG: Time: 2117 CBG Result:78  Possible Reasons for Event: pt reports blood sugar that is unstable, (driopping quickly at home) pt had surgery and was npo for large part of the day.   Comments/MD notified: Kathyrn Sheriff informed while talking with family.     Paulina Fusi

## 2015-06-25 NOTE — Progress Notes (Signed)
Dr. Oletta Lamas notified that pt. Drank 16oz. Of Sprite between 0730 and 1100 this morning.

## 2015-06-25 NOTE — Op Note (Signed)
PREOP DIAGNOSIS:  1. Multiple brain tumors   POSTOP DIAGNOSIS: Same  PROCEDURE: 1. Same  SURGEON: Dr. Consuella Lose, MD  ASSISTANT: Dr. Sherley Bounds, MD  ANESTHESIA: General Endotracheal  EBL: Minimal  SPECIMENS: right parietal/corpus callosum tumor for frozen/permanent pathology  DRAINS: None  COMPLICATIONS: None immediate  CONDITION: Hemodynamically stable to PACU  HISTORY: [name] is a 69 y.o. female who initially presented with confusion and aphasia. MRI demonstrated multiple brain lesions, including a large lesion involving the splenium of the corpus callosum extending into both parietal lobes. CT chest abdomen and pelvis did not demonstrate any areas of concern for primary malignancy. She therefore presents for stereotactic brain biopsy.  PROCEDURE IN DETAIL: After informed consent was obtained and witnessed, the patient was brought to the operating room. After induction of general anesthesia, the patient was positioned on the operative table in the left lateral decubitus position. The [place] head holder was then applied to the patient and affixed to the table. Utilizing the preoperative stereotactic MRI scan, surface markers were co-registered until satisfactory accuracy was achieved. Preoperatively, location for the planned biopsy was identified, as well as a trajectory traversing the right parietal lobe. Surface projection of this trajectory was identified using the stereotactic system, and the area was clipped, prepped, and draped in the usual sterile fashion. All pressure points were meticulously padded.   After timeout was conducted, skin incision was infiltrated with local anesthetic with epinephrine. Incision was then made sharply, and electrocautery was used to dissect down through the periosteum. Self-retaining retractor was then placed. A 7 mm perforator was then used to create a bur hole. Dura was coagulated, the pia was identified and coagulated. The  Varioguide was then attached to the [place], positioned appropriately, and biopsy needle was introduced. Specimen was then taken, removed, and sent for frozen pathology. This did demonstrate multiple areas of necrosis, with one specimen demonstrated hypercellularity. Pathologist did ask for more tissue in the same region. We therefore took another specimen along the same trajectory. Biopsy needle was then removed. Gelfoam was placed in the burr hole, and it was covered with a bur hole cover and secured with titanium screws.  The wound was then irrigated with copious amounts of normal saline and bacitracin irrigation. The galea was closed using 3-0 Vicryl stitches. Skin staples were used for closure. Sterile dressing was then applied. The patient was removed from the [place] head holder, transferred to stretcher, extubated, and taken to the postanesthesia care unit in stable hemodynamic condition. At the end of the case all sponge, needle, instrument, and cottonoid counts were correct.

## 2015-06-25 NOTE — Progress Notes (Signed)
Discussed brief periods of bradycardia with Dr. Kathyrn Sheriff, patient remained alert and oriented with bradycardia and blood pressure remained stable. MD aware of asymptomatic bradycardia.

## 2015-06-26 DIAGNOSIS — C711 Malignant neoplasm of frontal lobe: Secondary | ICD-10-CM | POA: Diagnosis present

## 2015-06-26 DIAGNOSIS — D496 Neoplasm of unspecified behavior of brain: Secondary | ICD-10-CM | POA: Diagnosis not present

## 2015-06-26 LAB — GLUCOSE, CAPILLARY
GLUCOSE-CAPILLARY: 30 mg/dL — AB (ref 65–99)
GLUCOSE-CAPILLARY: 149 mg/dL — AB (ref 65–99)
GLUCOSE-CAPILLARY: 142 mg/dL — AB (ref 65–99)
Glucose-Capillary: 116 mg/dL — ABNORMAL HIGH (ref 65–99)
Glucose-Capillary: 342 mg/dL — ABNORMAL HIGH (ref 65–99)
Glucose-Capillary: 96 mg/dL (ref 65–99)

## 2015-06-26 MED ORDER — DEXAMETHASONE 4 MG PO TABS: 4.0000 mg | ORAL_TABLET | Freq: Every day | ORAL | Status: DC

## 2015-06-26 NOTE — Discharge Summary (Signed)
Physician Discharge Summary  Patient ID: [name] MRN: [id number] DOB/AGE: May 12, 1946 69 y.o.  Admit date: 06/25/2015 Discharge date: 06/26/2015  Admission Diagnoses: Brain mass    Discharge Diagnoses: Same   Discharged Condition: stable  Hospital Course: The patient was admitted on 06/25/2015 and taken to the operating room where the patient underwent stereotactic biopsy for brain mass. The patient tolerated the procedure well and was taken to the recovery room and then to the ICU in stable condition. The hospital course was routine. There were no complications. The wound remained clean dry and intact. Pt had appropriate head soreness. No complaints of new N/T/W. The patient remained afebrile with stable vital signs, and tolerated a regular diet. The patient continued to increase activities, and pain was well controlled with oral pain medications.   Consults: None  Significant Diagnostic Studies:  Results for orders placed or performed during the hospital encounter of 06/25/15  Surgical pcr screen  Result Value Ref Range   MRSA, PCR POSITIVE (A) NEGATIVE   Staphylococcus aureus POSITIVE (A) NEGATIVE  Basic metabolic panel  Result Value Ref Range   Sodium 130 (L) 135 - 145 mmol/L   Potassium 3.0 (L) 3.5 - 5.1 mmol/L   Chloride 93 (L) 101 - 111 mmol/L   CO2 28 22 - 32 mmol/L   Glucose, Bld 201 (H) 65 - 99 mg/dL   BUN 33 (H) 6 - 20 mg/dL   Creatinine, Ser 1.41 (H) 0.44 - 1.00 mg/dL   Calcium 8.4 (L) 8.9 - 10.3 mg/dL   GFR calc non Af Amer 37 (L) >60 mL/min   GFR calc Af Amer 43 (L) >60 mL/min   Anion gap 9 5 - 15  CBC  Result Value Ref Range   WBC 23.0 (H) 4.0 - 10.5 K/uL   RBC 4.70 3.87 - 5.11 MIL/uL   Hemoglobin 13.8 12.0 - 15.0 g/dL   HCT 38.2 36.0 - 46.0 %   MCV 81.3 78.0 - 100.0 fL   MCH 29.4 26.0 - 34.0 pg   MCHC 36.1 (H) 30.0 - 36.0 g/dL   RDW 14.1 11.5 - 15.5 %   Platelets 276 150 - 400 K/uL  Glucose, capillary  Result Value Ref Range   Glucose-Capillary  44 (LL) 65 - 99 mg/dL  Glucose, capillary  Result Value Ref Range   Glucose-Capillary 141 (H) 65 - 99 mg/dL  Glucose, capillary  Result Value Ref Range   Glucose-Capillary 86 65 - 99 mg/dL  Glucose, capillary  Result Value Ref Range   Glucose-Capillary 62 (L) 65 - 99 mg/dL  Glucose, capillary  Result Value Ref Range   Glucose-Capillary 79 65 - 99 mg/dL  Glucose, capillary  Result Value Ref Range   Glucose-Capillary 156 (H) 65 - 99 mg/dL  Glucose, capillary  Result Value Ref Range   Glucose-Capillary 30 (LL) 65 - 99 mg/dL   Comment 1 Notify RN    Comment 2 Document in Chart   Glucose, capillary  Result Value Ref Range   Glucose-Capillary 149 (H) 65 - 99 mg/dL  Glucose, capillary  Result Value Ref Range   Glucose-Capillary 116 (H) 65 - 99 mg/dL  Glucose, capillary  Result Value Ref Range   Glucose-Capillary 342 (H) 65 - 99 mg/dL    Ct Chest W Contrast  06/24/2015  CLINICAL DATA:  Uterine carcinoma and brain tumor. Initial treatment evaluation. EXAM: CT CHEST AND ABDOMEN WITH CONTRAST TECHNIQUE: Multidetector CT imaging of the chest and abdomen was performed following the standard protocol during  bolus administration of intravenous contrast. CONTRAST:  5mL OMNIPAQUE IOHEXOL 300 MG/ML  SOLN COMPARISON:  Brain MRI 05/26/2015 FINDINGS: CT CHEST FINDINGS Mediastinum/Nodes: No axillary supraclavicular lymphadenopathy. No mediastinal hilar. No pericardial fluid. Esophagus. Coronary calcifications noted. Lungs/Pleura: No pulmonary nodularity. Airways are normal. No airspace disease. CT ABDOMEN AND PELVIS FINDINGS Hepatobiliary: No focal hepatic lesion lumen of the gallbladder is packed with multiple small gallstones. Common bile duct normal caliber. Pancreas: Pancreas is normal. No ductal dilatation. No pancreatic inflammation. Spleen: Normal spleen Adrenals/urinary tract: Adrenal glands, kidneys, and proximal ureters are normal. Stomach/Bowel: Stomach, duodenum and limited view of the bowel  is unremarkable. Vascular/Lymphatic: Abdominal or is normal caliber. No abdominal adenopathy. Musculoskeletal: No aggressive osseous lesion. Other: No free fluid. IMPRESSION: Chest Impression: 1. No evidence of thoracic metastasis. 2. Coronary calcifications. Abdomen / Pelvis Impression: 1. No evidence of abdominal metastasis 2. Multiple gallstones without evidence of cholecystitis Electronically Signed   By: Suzy Bouchard M.D.   On: 06/24/2015 16:24   Mr [name] JJ Contrast  06/24/2015  CLINICAL DATA:  69 year old diabetic female with intracranial lesions, pre biopsy. Subsequent encounter. EXAM: MRI HEAD WITHOUT AND WITH CONTRAST TECHNIQUE: Multiplanar, multiecho pulse sequences of the brain and surrounding structures were obtained without and with intravenous contrast. CONTRAST:  38mL MULTIHANCE GADOBENATE DIMEGLUMINE 529 MG/ML IV SOLN COMPARISON:  05/26/2015 head CT and brain MR. FINDINGS: Multiple intracranial lesions, largest within the splenium of the corpus callosum spanning into the periatrial region measures 6.5 x 2.5 x 2.5 cm. Periatrial extension is greater on the left. Largest satellite lesion posterior left frontal operculum region measures 4.1 x 2.9 x 1.8 cm. Smaller satellite lesions include: Posterior left temporal-frontal 4 x 5 mm enhancing lesion (series 12, image 19) previously measured 4 x 3 mm. Medial left parietal lobe 2.5 mm lesion. Left parietal lobe 7 mm lesion (series 12, image 23) previously measured 5 mm. Left parietal lobe 3 x 6 mm lesion. Right parietal lobe 6 mm lesion (series 12, image 21) previously measured 3 mm. Superior posterior left temporal lobe 1.1 cm lesion (series 12, image 16) previously measured 7 mm. Surrounding T2 altered signal intensity fairly confluent in appearance. As I am suspicious that this lesion represents a multifocal glioma, the T2 signal abnormality may represent most likely contains tumor cells in addition to vasogenic edema. Lymphoma is a secondary  consideration. Metastatic disease and felt to be less likely consideration. Lesions are hyper cellular with restricted motion. Blood breakdown products seen within some of the lesions. No acute infarct or hydrocephalus. Major intracranial vascular structures are patent. Mild C4-5 bulge.  Mild transverse ligament hypertrophy. Mild mucosal thickening inferior aspect maxillary sinuses. IMPRESSION: Multifocal glioma suspected centered in the splenium of the corpus callosum with surrounding satellite lesions as detailed above some of which have increased in size in a short interim. Lymphoma is a secondary less likely consideration and metastatic disease although not excluded is felt unlikely. Electronically Signed   By: Genia Del M.D.   On: 06/24/2015 19:55   Ct Abdomen Pelvis W Contrast  06/24/2015  CLINICAL DATA:  Uterine carcinoma and brain tumor. Initial treatment evaluation. EXAM: CT CHEST AND ABDOMEN WITH CONTRAST TECHNIQUE: Multidetector CT imaging of the chest and abdomen was performed following the standard protocol during bolus administration of intravenous contrast. CONTRAST:  63mL OMNIPAQUE IOHEXOL 300 MG/ML  SOLN COMPARISON:  Brain MRI 05/26/2015 FINDINGS: CT CHEST FINDINGS Mediastinum/Nodes: No axillary supraclavicular lymphadenopathy. No mediastinal hilar. No pericardial fluid. Esophagus. Coronary calcifications noted. Lungs/Pleura: No  pulmonary nodularity. Airways are normal. No airspace disease. CT ABDOMEN AND PELVIS FINDINGS Hepatobiliary: No focal hepatic lesion lumen of the gallbladder is packed with multiple small gallstones. Common bile duct normal caliber. Pancreas: Pancreas is normal. No ductal dilatation. No pancreatic inflammation. Spleen: Normal spleen Adrenals/urinary tract: Adrenal glands, kidneys, and proximal ureters are normal. Stomach/Bowel: Stomach, duodenum and limited view of the bowel is unremarkable. Vascular/Lymphatic: Abdominal or is normal caliber. No abdominal adenopathy.  Musculoskeletal: No aggressive osseous lesion. Other: No free fluid. IMPRESSION: Chest Impression: 1. No evidence of thoracic metastasis. 2. Coronary calcifications. Abdomen / Pelvis Impression: 1. No evidence of abdominal metastasis 2. Multiple gallstones without evidence of cholecystitis Electronically Signed   By: Suzy Bouchard M.D.   On: 06/24/2015 16:24    Antibiotics:  Anti-infectives    Start     Dose/Rate Route Frequency Ordered Stop   06/25/15 1622  bacitracin 50,000 Units in sodium chloride irrigation 0.9 % 500 mL irrigation  Status:  Discontinued       As needed 06/25/15 1623 06/25/15 1724      Discharge Exam: Blood pressure 141/66, pulse 72, temperature 98.4 F (36.9 C), temperature source Oral, resp. rate 15, height 5\' 7"  (1.702 m), weight 254 lb (115.214 kg), SpO2 98 %. Neurologic: Grossly normal Incision clean dry and intact  Discharge Medications:     Medication List    TAKE these medications        acetaminophen 80 MG/0.8ML suspension  Commonly known as:  TYLENOL  Take 10 mg/kg by mouth every 4 (four) hours as needed for fever.     b complex vitamins tablet  Take 1 tablet by mouth daily.     bismuth subsalicylate 494 WH/67RF suspension  Commonly known as:  PEPTO BISMOL  Take 7.5 mLs by mouth every 6 (six) hours as needed for indigestion or diarrhea or loose stools.     bumetanide 2 MG tablet  Commonly known as:  BUMEX  Take 1 mg by mouth daily.     COD LIVER OIL PO  Take 1 capsule by mouth daily.     CRANBERRY PO  Take 1 tablet by mouth 2 (two) times daily.     dexamethasone 4 MG tablet  Commonly known as:  DECADRON  Take 1 tablet (4 mg total) by mouth daily.     glipiZIDE 10 MG tablet  Commonly known as:  GLUCOTROL  Take 10 mg by mouth 2 (two) times daily before a meal.     hydrochlorothiazide 25 MG tablet  Commonly known as:  HYDRODIURIL  Take 25 mg by mouth daily.     hydrOXYzine 50 MG tablet  Commonly known as:  ATARAX/VISTARIL  Take  50 mg by mouth 3 (three) times daily as needed for itching.     insulin NPH-regular Human (70-30) 100 UNIT/ML injection  Commonly known as:  NOVOLIN 70/30  Inject 80 Units into the skin 2 (two) times daily with a meal. Sliding scale     insulin regular 100 units/mL injection  Commonly known as:  NOVOLIN R,HUMULIN R  Inject 0-20 Units into the skin daily as needed for high blood sugar. Sliding scale     iron polysaccharides 150 MG capsule  Commonly known as:  NIFEREX  Take 150 mg by mouth 2 (two) times daily.     lactobacillus acidophilus Tabs tablet  Take 1 tablet by mouth daily.     latanoprost 0.005 % ophthalmic solution  Commonly known as:  XALATAN  Place 1 drop  into both eyes at bedtime.     multivitamin with minerals tablet  Take 1 tablet by mouth daily.     nystatin cream  Commonly known as:  MYCOSTATIN  Apply 1 application topically daily as needed for dry skin.     OVER THE COUNTER MEDICATION  Take 1 tablet by mouth 2 (two) times daily. Chlorellea     potassium chloride 10 MEQ tablet  Commonly known as:  K-DUR,KLOR-CON  Take 10 mEq by mouth 2 (two) times daily.     timolol 0.5 % ophthalmic solution  Commonly known as:  TIMOPTIC  Place 1 drop into both eyes 2 (two) times daily.     traMADol 50 MG tablet  Commonly known as:  ULTRAM  Take 50 mg by mouth every 6 (six) hours as needed for moderate pain.     vitamin C 500 MG tablet  Commonly known as:  ASCORBIC ACID  Take 500 mg by mouth 2 (two) times daily.        Disposition: Home   Final Dx: Brain mass      Discharge Instructions    Call MD for:  difficulty breathing, headache or visual disturbances    Complete by:  As directed      Call MD for:  persistant dizziness or light-headedness    Complete by:  As directed      Call MD for:  persistant nausea and vomiting    Complete by:  As directed      Call MD for:  redness, tenderness, or signs of infection (pain, swelling, redness, odor or green/yellow  discharge around incision site)    Complete by:  As directed      Call MD for:  severe uncontrolled pain    Complete by:  As directed      Call MD for:  temperature >100.4    Complete by:  As directed      Diet - low sodium heart healthy    Complete by:  As directed      Discharge instructions    Complete by:  As directed   No strenuous activity, no driving, may shower     Increase activity slowly    Complete by:  As directed      No wound care    Complete by:  As directed            Follow-up Information    Follow up with [facility], NEELESH, C, MD. Schedule an appointment as soon as possible for a visit in 2 weeks.   Specialty:  Neurosurgery   Contact information:   [address] [place] [address] [place] [address] [phone]        Signed: Eustace Moore 06/26/2015, 10:18 AM

## 2015-06-28 ENCOUNTER — Encounter (HOSPITAL_BASED_OUTPATIENT_CLINIC_OR_DEPARTMENT_OTHER): Payer: Medicare Other | Attending: Plastic Surgery

## 2015-06-28 ENCOUNTER — Encounter (HOSPITAL_COMMUNITY): Payer: Self-pay | Admitting: Neurosurgery

## 2015-06-28 NOTE — Anesthesia Postprocedure Evaluation (Signed)
  Anesthesia Post-op Note  Patient: [name]  Procedure(s) Performed: Procedure(s) with comments: Right parietal stereotactic brain biopsy (Right) - Right parietal stereotactic brain biopsy  Patient Location: PACU  Anesthesia Type:General  Level of Consciousness: awake  Airway and Oxygen Therapy: Patient Spontanous Breathing  Post-op Pain: mild  Post-op Assessment: Post-op Vital signs reviewed LLE Motor Response: Purposeful movement LLE Sensation: Full sensation RLE Motor Response: Purposeful movement RLE Sensation: Full sensation      Post-op Vital Signs: Reviewed  Last Vitals:  Filed Vitals:   06/26/15 1200  BP: 137/68  Pulse: 79  Temp: 36.4 C  Resp: 15    Complications: No apparent anesthesia complications

## 2015-06-29 ENCOUNTER — Telehealth: Payer: Self-pay | Admitting: Hematology

## 2015-06-29 NOTE — Telephone Encounter (Signed)
new patient appt-s/w patient/son and gave np appt for 10/24 @ 3 w/Dr. Burr Medico Referring Dr. Consuella Lose Dx-brain tumor

## 2015-07-02 ENCOUNTER — Encounter: Payer: Self-pay | Admitting: Radiation Oncology

## 2015-07-02 NOTE — Progress Notes (Signed)
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  Patient presented with symptoms of:  Presented to the ED complaining of increased slurred speech and aphasia for approximately one week  Past or anticipated interventions, if any, per neurosurgery: yes, stereotatic brain biopsy performed 06/25/15  Past or anticipated interventions, if any, per medical oncology: no  Dose of Decadron, if applicable: decadron 4 mg tablet daily  Recent neurologic symptoms, if any:   Seizures: yes; one year ago  Headaches: yes; left parietal  Nausea: no  Dizziness/ataxia: yes; upon standing  Difficulty with hand coordination: no  Focal numbness/weakness: no  Visual deficits/changes: no  Confusion/Memory deficits: yes, prior to biopsy  Painful bone metastases at present, if any: no  SAFETY ISSUES:  Prior radiation? no  Pacemaker/ICD? no  Possible current pregnancy? no  Is the patient on methotrexate? no  Additional Complaints / other details: 69 year old female.

## 2015-07-05 ENCOUNTER — Ambulatory Visit
Admission: RE | Admit: 2015-07-05 | Discharge: 2015-07-05 | Disposition: A | Discharge status: Home / Self Care | Payer: Medicare Other | Source: Ambulatory Visit | Attending: Internal Medicine | Admitting: Internal Medicine

## 2015-07-05 ENCOUNTER — Ambulatory Visit
Admission: RE | Admit: 2015-07-05 | Discharge: 2015-07-05 | Disposition: A | Discharge status: Home / Self Care | Payer: Medicare Other | Source: Ambulatory Visit | Attending: Radiation Oncology | Admitting: Radiation Oncology

## 2015-07-05 ENCOUNTER — Encounter: Payer: Self-pay | Admitting: Hematology and Oncology

## 2015-07-05 ENCOUNTER — Encounter: Payer: Self-pay | Admitting: Radiation Oncology

## 2015-07-05 ENCOUNTER — Telehealth: Payer: Self-pay | Admitting: Hematology and Oncology

## 2015-07-05 ENCOUNTER — Ambulatory Visit (HOSPITAL_BASED_OUTPATIENT_CLINIC_OR_DEPARTMENT_OTHER): Payer: Medicare Other | Admitting: Hematology and Oncology

## 2015-07-05 ENCOUNTER — Ambulatory Visit: Payer: Medicare Other | Admitting: Hematology

## 2015-07-05 VITALS — BP 125/68 | HR 88 | Resp 16 | Ht 67.0 in | Wt 254.0 lb

## 2015-07-05 VITALS — BP 144/71 | Temp 97.6°F | Resp 17 | Ht 67.0 in | Wt 259.8 lb

## 2015-07-05 DIAGNOSIS — C719 Malignant neoplasm of brain, unspecified: Secondary | ICD-10-CM | POA: Diagnosis present

## 2015-07-05 DIAGNOSIS — E1121 Type 2 diabetes mellitus with diabetic nephropathy: Secondary | ICD-10-CM | POA: Insufficient documentation

## 2015-07-05 DIAGNOSIS — R530 Neoplastic (malignant) related fatigue: Secondary | ICD-10-CM

## 2015-07-05 DIAGNOSIS — C711 Malignant neoplasm of frontal lobe: Secondary | ICD-10-CM

## 2015-07-05 DIAGNOSIS — C710 Malignant neoplasm of cerebrum, except lobes and ventricles: Secondary | ICD-10-CM | POA: Diagnosis not present

## 2015-07-05 DIAGNOSIS — Z7189 Other specified counseling: Secondary | ICD-10-CM | POA: Diagnosis not present

## 2015-07-05 DIAGNOSIS — Z510 Encounter for antineoplastic radiation therapy: Secondary | ICD-10-CM | POA: Diagnosis present

## 2015-07-05 DIAGNOSIS — H409 Unspecified glaucoma: Secondary | ICD-10-CM | POA: Diagnosis not present

## 2015-07-05 DIAGNOSIS — I4891 Unspecified atrial fibrillation: Secondary | ICD-10-CM | POA: Insufficient documentation

## 2015-07-05 DIAGNOSIS — F329 Major depressive disorder, single episode, unspecified: Secondary | ICD-10-CM | POA: Insufficient documentation

## 2015-07-05 DIAGNOSIS — Z515 Encounter for palliative care: Secondary | ICD-10-CM

## 2015-07-05 DIAGNOSIS — Z8542 Personal history of malignant neoplasm of other parts of uterus: Secondary | ICD-10-CM | POA: Insufficient documentation

## 2015-07-05 MED ORDER — TEMOZOLOMIDE 180 MG PO CAPS: 75.0000 mg/m2/d | ORAL_CAPSULE | Freq: Every day | ORAL | Status: DC

## 2015-07-05 NOTE — Assessment & Plan Note (Signed)
Multifocal bilateral glioblastoma multiforme: I discussed with the patient extensively regarding the natural history of glioblastoma. Unresectable glioblastoma tend to have extremely poor prognosis. Her best treatment approach given the extent of her disease is combination of radiation with Temodar. Patient is scheduled to undergo 15 fractions of radiation therapy. I recommended adding concurrent temozolomide to the treatment.  Temozolomide counseling: Patient will receive 75 mg/m concurrently with radiation and 150 mg/m as maintenance therapy of radiation is complete days 1 through 5 every 4 weeks and maybe increasing up to 200 mg/m on cycle 2 on worse if well tolerated. I discussed with her that nausea, vomiting, decreased blood counts, fatigue of some of the complications of Temodar  I would like to see her 1 week after starting Temodar therapy. Patient is extremely difficult blood draw. We plan to check blood work as infrequently as possible.  Prognosis: I counseled him extensively that concurrent chemoradiation with Temodar cannot cure her disease. The goals of treatment are to prolongation of life and to keep disease in the control. Median survival for unresectable neoplasm or deformity is measured and only few months.  Plan to start treatment November 3rd and I will see her back on November 10.

## 2015-07-05 NOTE — Progress Notes (Signed)
Weight and vitals stable. Accompanied by five family members today. Ambulates with the aid of a cane. Bilateral low extremity wounds due to venous insufficiency has been managed by in house and outpatient therapy since 1984. 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 Reports she lives 40 minutes away from [facility]. Family reports since the patient has tapered down to decadron 4 mg once per day her expressive asphasia. Staples noted right parietal scalp. Incision well approximated without redness, drainage or edema. Reports intermittent dizziness with standing.   BP 125/68 mmHg  Pulse 88  Resp 16  Ht 5\' 7"  (1.702 m)  Wt 254 lb (115.214 kg)  BMI 39.77 kg/m2  SpO2 100% Wt Readings from Last 3 Encounters:  07/05/15 259 lb 12.8 oz (117.845 kg)  07/05/15 254 lb (115.214 kg)  06/25/15 254 lb (115.214 kg)

## 2015-07-05 NOTE — Telephone Encounter (Signed)
S/w patient and informed her she will see Dr. Lindi Adie @ 3:45 on 10/24.

## 2015-07-05 NOTE — Progress Notes (Signed)
Radiation Oncology         [phone] ________________________________  Initial Outpatient Consultation  Name: [name] MRN: [id number]  Date: 07/05/2015  DOB: 02-11-1946  BH:ALPFXT, Gwyndolyn Saxon, MD  Consuella Lose, MD   REFERRING PHYSICIAN: Consuella Lose, MD  DIAGNOSIS: 69 year old woman with a multifocal bi-hemispheric glioblastoma.    ICD-9-CM ICD-10-CM   1. GBM (glioblastoma multiforme) (HCC) 191.9 C71.9   2. 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 female who presented to the ED on 9/14 for slurred speech, difficulty writing, and being slow to respond for a 1 week history. She reports having a transient ischemic attack (TIA) approximately 3 months before the hospital visit. CT of the head w/o contrast found intracranial lesions in the splenium of the corpus callosum and in the left parietal area with tumoral edema. MRI of the brain found 8 supratentorial enhancing masses with the appearance of multifocal glioma. CT of the chest and abdomen/pelvic on 06/24/15 showed no evidence of thoracic or abdominal metastasis. Repeat MRI on 06/24/15 showed multifocal glioma centered in the splenium of the corpus callosum with surrounding satellite lesions. Lymphoma is a secondary, but less likely scenario. On 06/25/15, a brain biopsy was performed by Dr. Kathyrn Sheriff revealed glioblastoma. The patient presents for my opinion to consider radiotherapy for the management of her disease. The patient reports uterine cancer that was diagnosed 2 years ago.  PREVIOUS RADIATION THERAPY: No  PAST MEDICAL HISTORY:  has a past medical history of Venous insufficiency; Venous stasis ulcer ([place]); Diabetes mellitus due to underlying condition with diabetic nephropathy ([place]); Neuropathy ([place]); DJD (degenerative joint disease); Anemia; Nephropathy, diabetic ([place]); Atrial fibrillation ([place]); Depression;  Seizures ([place]); Cancer ([place]); Constipation; Glaucoma; and Glioblastoma ([place]).    PAST SURGICAL HISTORY: Past Surgical History  Procedure Laterality Date  . Tonsilectomy, adenoidectomy, bilateral myringotomy and tubes    . Mouth surgery    . I&d extremity Left 01/14/2014    Procedure: IRRIGATION AND DEBRIDEMENT LEFT LOWER EXTREMITY POSSIBLE ACELL AND POSSIBLE VAC PLACEMENT;  Surgeon: Theodoro Kos, DO;  Location: [facility];  Service: Plastics;  Laterality: Left;  . Cervical ablation    . Brain biopsy Right 06/25/2015    Procedure: Right parietal stereotactic brain biopsy;  Surgeon: Consuella Lose, MD;  Location: [facility];  Service: Neurosurgery;  Laterality: Right;  Right parietal stereotactic brain biopsy    FAMILY HISTORY: family history includes Diabetes in her maternal grandmother; Heart disease in her father and mother.  SOCIAL HISTORY:  Social History   Social History  . Marital Status: Married    Spouse Name: N/A  . Number of Children: N/A  . Years of Education: N/A   Occupational History  . Not on file.   Social History Main Topics  . Smoking status: Never Smoker   . Smokeless tobacco: Never Used  . Alcohol Use: No  . Drug Use: No  . Sexual Activity: No   Other Topics Concern  . Not on file   Social History Narrative    ALLERGIES: Bactrim; Sulfa antibiotics; Vancomycin; Atorvastatin; Celebrex; Ibuprofen; Latex; Pioglitazone; and Neosporin  MEDICATIONS:  Current Outpatient Prescriptions  Medication Sig Dispense Refill  . ACCU-CHEK AVIVA PLUS test strip USE AS DIRECTED 4 TIMES A DAY  11  . acetaminophen (TYLENOL) 80 MG/0.8ML suspension Take 10 mg/kg by mouth every 4 (four) hours as needed for fever.    Marland Kitchen b complex vitamins  tablet Take 1 tablet by mouth daily.    Marland Kitchen bismuth subsalicylate (PEPTO BISMOL) 262 MG/15ML suspension Take 7.5 mLs by mouth every 6 (six) hours as needed for indigestion or diarrhea or loose stools.    . bumetanide (BUMEX) 2 MG tablet Take 1  mg by mouth daily.   6  . COD LIVER OIL PO Take 1 capsule by mouth daily.    Marland Kitchen CRANBERRY PO Take 1 tablet by mouth 2 (two) times daily.    Marland Kitchen dexamethasone (DECADRON) 4 MG tablet Take 1 tablet (4 mg total) by mouth daily. 20 tablet 0  . glipiZIDE (GLUCOTROL) 10 MG tablet Take 10 mg by mouth 2 (two) times daily before a meal.     . hydrochlorothiazide 25 MG tablet Take 25 mg by mouth daily.     . hydrOXYzine (ATARAX/VISTARIL) 50 MG tablet Take 50 mg by mouth 3 (three) times daily as needed for itching.     . insulin NPH-insulin regular (NOVOLIN 70/30) (70-30) 100 UNIT/ML injection Inject 80 Units into the skin 2 (two) times daily with a meal. Sliding scale    . insulin regular (NOVOLIN R,HUMULIN R) 100 units/mL injection Inject 0-20 Units into the skin daily as needed for high blood sugar. Sliding scale    . lactobacillus acidophilus (BACID) TABS tablet Take 1 tablet by mouth daily.    Marland Kitchen latanoprost (XALATAN) 0.005 % ophthalmic solution Place 1 drop into both eyes at bedtime.    . Multiple Vitamins-Minerals (MULTIVITAMIN WITH MINERALS) tablet Take 1 tablet by mouth daily.    Marland Kitchen nystatin cream (MYCOSTATIN) Apply 1 application topically daily as needed for dry skin.    Marland Kitchen OVER THE COUNTER MEDICATION Take 1 tablet by mouth 2 (two) times daily. [place]lorel[place]a    . potassium chloride (K-DUR) 10 MEQ tablet     . potassium chloride (K-DUR,KLOR-CON) 10 MEQ tablet Take 10 mEq by mouth 2 (two) times daily.    . timolol (TIMOPTIC) 0.5 % ophthalmic solution Place 1 drop into both eyes 2 (two) times daily.     . traMADol (ULTRAM) 50 MG tablet Take 50 mg by mouth every 6 (six) hours as needed for moderate pain.    . vitamin C (ASCORBIC ACID) 500 MG tablet Take 500 mg by mouth 2 (two) times daily.     . iron polysaccharides (NIFEREX) 150 MG capsule Take 150 mg by mouth 2 (two) times daily.    Marland Kitchen temozolomide (TEMODAR) 180 MG capsule Take 1 capsule (180 mg total) by mouth daily. May take on an empty stomach or at bedtime  to decrease nausea & vomiting. 15 capsule 0   No current facility-administered medications for this encounter.    REVIEW OF SYSTEMS:  A 15 point review of systems is documented in the electronic medical record. This was obtained by the nursing staff. However, I reviewed this with the patient to discuss relevant findings and make appropriate changes.  Pertinent items are noted in HPI.   PHYSICAL EXAM:  height is 5\' 7"  (1.702 m) and weight is 254 lb (115.214 kg). Her blood pressure is 125/68 and her pulse is 88. Her respiration is 16 and oxygen saturation is 100%.   The patient is ambulatory with a cane. Her scalp has staples with no evidence of drainage or infection. EOMI. Speech notable for aphasia. Motor strength generally intact. Speech is articulate.  KPS = 60  100 - Normal; no complaints; no evidence of disease. 90   - Able to carry on normal  activity; minor signs or symptoms of disease. 80   - Normal activity with effort; some signs or symptoms of disease. 94   - Cares for self; unable to carry on normal activity or to do active work. 60   - Requires occasional assistance, but is able to care for most of his personal needs. 50   - Requires considerable assistance and frequent medical care. 45   - Disabled; requires special care and assistance. 57   - Severely disabled; hospital admission is indicated although death not imminent. 48   - Very sick; hospital admission necessary; active supportive treatment necessary. 10   - Moribund; fatal processes progressing rapidly. 0     - Dead  Karnofsky DA, Abelmann Caberfae, Craver LS and Tangent JH [id number]) The use of the nitrogen mustards in the palliative treatment of carcinoma: with particular reference to bronchogenic carcinoma Cancer 1 634-56  LABORATORY DATA:  Lab Results  Component Value Date   WBC 23.0* 06/25/2015   HGB 13.8 06/25/2015   HCT 38.2 06/25/2015   MCV 81.3 06/25/2015   PLT 276 06/25/2015   Lab Results  Component Value Date    NA 130* 06/25/2015   K 3.0* 06/25/2015   CL 93* 06/25/2015   CO2 28 06/25/2015   Lab Results  Component Value Date   ALT 15 05/26/2015   AST 23 05/26/2015   ALKPHOS 112 05/26/2015   BILITOT 1.5* 05/26/2015     RADIOGRAPHY: Ct Chest W Contrast  06/24/2015  CLINICAL DATA:  Uterine carcinoma and brain tumor. Initial treatment evaluation. EXAM: CT CHEST AND ABDOMEN WITH CONTRAST TECHNIQUE: Multidetector CT imaging of the chest and abdomen was performed following the standard protocol during bolus administration of intravenous contrast. CONTRAST:  61mL OMNIPAQUE IOHEXOL 300 MG/ML  SOLN COMPARISON:  Brain MRI 05/26/2015 FINDINGS: CT CHEST FINDINGS Mediastinum/Nodes: No axillary supraclavicular lymphadenopathy. No mediastinal hilar. No pericardial fluid. Esophagus. Coronary calcifications noted. Lungs/Pleura: No pulmonary nodularity. Airways are normal. No airspace disease. CT ABDOMEN AND PELVIS FINDINGS Hepatobiliary: No focal hepatic lesion lumen of the gallbladder is packed with multiple small gallstones. Common bile duct normal caliber. Pancreas: Pancreas is normal. No ductal dilatation. No pancreatic inflammation. Spleen: Normal spleen Adrenals/urinary tract: Adrenal glands, kidneys, and proximal ureters are normal. Stomach/Bowel: Stomach, duodenum and limited view of the bowel is unremarkable. Vascular/Lymphatic: Abdominal or is normal caliber. No abdominal adenopathy. Musculoskeletal: No aggressive osseous lesion. Other: No free fluid. IMPRESSION: Chest Impression: 1. No evidence of thoracic metastasis. 2. Coronary calcifications. Abdomen / Pelvis Impression: 1. No evidence of abdominal metastasis 2. Multiple gallstones without evidence of cholecystitis Electronically Signed   By: Suzy Bouchard M.D.   On: 06/24/2015 16:24   Mr [name] SW Contrast  06/24/2015  CLINICAL DATA:  69 year old diabetic female with intracranial lesions, pre biopsy. Subsequent encounter. EXAM: MRI HEAD WITHOUT AND  WITH CONTRAST TECHNIQUE: Multiplanar, multiecho pulse sequences of the brain and surrounding structures were obtained without and with intravenous contrast. CONTRAST:  62mL MULTIHANCE GADOBENATE DIMEGLUMINE 529 MG/ML IV SOLN COMPARISON:  05/26/2015 head CT and brain MR. FINDINGS: Multiple intracranial lesions, largest within the splenium of the corpus callosum spanning into the periatrial region measures 6.5 x 2.5 x 2.5 cm. Periatrial extension is greater on the left. Largest satellite lesion posterior left frontal operculum region measures 4.1 x 2.9 x 1.8 cm. Smaller satellite lesions include: Posterior left temporal-frontal 4 x 5 mm enhancing lesion (series 12, image 19) previously measured 4 x 3 mm. Medial left parietal lobe 2.5  mm lesion. Left parietal lobe 7 mm lesion (series 12, image 23) previously measured 5 mm. Left parietal lobe 3 x 6 mm lesion. Right parietal lobe 6 mm lesion (series 12, image 21) previously measured 3 mm. Superior posterior left temporal lobe 1.1 cm lesion (series 12, image 16) previously measured 7 mm. Surrounding T2 altered signal intensity fairly confluent in appearance. As I am suspicious that this lesion represents a multifocal glioma, the T2 signal abnormality may represent most likely contains tumor cells in addition to vasogenic edema. Lymphoma is a secondary consideration. Metastatic disease and felt to be less likely consideration. Lesions are hyper cellular with restricted motion. Blood breakdown products seen within some of the lesions. No acute infarct or hydrocephalus. Major intracranial vascular structures are patent. Mild C4-5 bulge.  Mild transverse ligament hypertrophy. Mild mucosal thickening inferior aspect maxillary sinuses. IMPRESSION: Multifocal glioma suspected centered in the splenium of the corpus callosum with surrounding satellite lesions as detailed above some of which have increased in size in a short interim. Lymphoma is a secondary less likely  consideration and metastatic disease although not excluded is felt unlikely. Electronically Signed   By: Genia Del M.D.   On: 06/24/2015 19:55   Ct Abdomen Pelvis W Contrast  06/24/2015  CLINICAL DATA:  Uterine carcinoma and brain tumor. Initial treatment evaluation. EXAM: CT CHEST AND ABDOMEN WITH CONTRAST TECHNIQUE: Multidetector CT imaging of the chest and abdomen was performed following the standard protocol during bolus administration of intravenous contrast. CONTRAST:  23mL OMNIPAQUE IOHEXOL 300 MG/ML  SOLN COMPARISON:  Brain MRI 05/26/2015 FINDINGS: CT CHEST FINDINGS Mediastinum/Nodes: No axillary supraclavicular lymphadenopathy. No mediastinal hilar. No pericardial fluid. Esophagus. Coronary calcifications noted. Lungs/Pleura: No pulmonary nodularity. Airways are normal. No airspace disease. CT ABDOMEN AND PELVIS FINDINGS Hepatobiliary: No focal hepatic lesion lumen of the gallbladder is packed with multiple small gallstones. Common bile duct normal caliber. Pancreas: Pancreas is normal. No ductal dilatation. No pancreatic inflammation. Spleen: Normal spleen Adrenals/urinary tract: Adrenal glands, kidneys, and proximal ureters are normal. Stomach/Bowel: Stomach, duodenum and limited view of the bowel is unremarkable. Vascular/Lymphatic: Abdominal or is normal caliber. No abdominal adenopathy. Musculoskeletal: No aggressive osseous lesion. Other: No free fluid. IMPRESSION: Chest Impression: 1. No evidence of thoracic metastasis. 2. Coronary calcifications. Abdomen / Pelvis Impression: 1. No evidence of abdominal metastasis 2. Multiple gallstones without evidence of cholecystitis Electronically Signed   By: Suzy Bouchard M.D.   On: 06/24/2015 16:24      IMPRESSION: 69 year old woman with a multifocal bi-hemispheric glioblastoma. Her tumor is unresectable. She is eligible for potiential treatment options including radiation therapy with or without chemotherapy. Her quality of life appears to be  relatively good surrounded by family.  PLAN: Today, I talked to the patient and family about the findings and work-up thus far.  We discussed the natural history of unresectable glioblastoma and general treatment, highlighting the role of radiotherapy in the management.  We discussed the available radiation techniques, and focused on the details of logistics and delivery.  We reviewed the anticipated acute and late sequelae associated with radiation in this setting.  The patient was encouraged to ask questions that I answered to the best of my ability. The patient would like to proceed with radiation and will be scheduled for CT simulation this Thursday at [facility].  To facilitate this discussion and develop plans, we requested consultation with Wadie Lessen, NP. She kindly agreed to meet with the patient today.  The patient has an appointment with  Dr. Lindi Adie later today to discuss systemic treatment options.  The patient's staples will be removed this Thursday after CT simulation.  I anticipate initiating hypofractionated radiotherapy using IMRT for 15 fractions to 40 Gy with Temodar on Thursday 07/15/15.  I spent 40 minutes minutes face to face with the patient and more than 50% of that time was spent in counseling and/or coordination of care.   This document serves as a record of services personally performed by Tyler Pita, MD. It was created on his behalf by Darcus Austin, a trained medical scribe. The creation of this record is based on the scribe's personal observations and the provider's statements to them. This document has been checked and approved by the attending provider.     ------------------------------------------------  Sheral Apley Tammi Klippel, M.D.

## 2015-07-05 NOTE — Consult Note (Signed)
Patient GQ:QPYPP [place]sen      DOB: [birth year]      JKD:[id number]     Consult Note from the Palliative Medicine Team at [facility] Requested by:     PCP: Shirline Frees, MD Reason for Consultation              Phone                                                                                                                                [phone]  Assessment of patients Current state:    Consult is for introduction to the concept of Palliative Medicine, clarification of Advanced Directives,  holistic support and symptom management as indicated  This NP Wadie Lessen reviewed medical records, received report from team, assessed the patient and then meet with [name] and her family to include her husband, three children  in the outpatient radiation- oncology clinic.  Long discussion with patient and her family,  creating space and opportunity to share thoughts, feelings and fears living with terminal illness.    A  discussion was had today regarding advanced directives. The difference between a aggressive medical intervention path  and a palliative comfort care path for this patient at this time was had.  Values and goals of care important to patient and family were attempted to be elicited.   Concept of Hospice and Palliative Care were discussed   Questions and concerns addressed.  . Family encouraged to call with questions or concerns.  PMT will continue to support holistically.   Goals of Care:   Code Status:  Full code-encouraged to consider DNR/DNI status in environment of terminal illness Encouraged to contemplate, discuss and document advanced directives.    Scope of Treatment:  At this time patients open to all offered and available medical interventions to prolong quality life.  She has an appointment today with her oncologist Dr Lindi Adie, and she plan to follow up with Dr Tammi Klippel this week to begin the process for radiation treatment    Psychosocial:   Emotional support offered to patient and her family.  All are struggling with information of long term poor prognosis.  Strong family unit, supporting patient in her current medical situation.  Symptom management  Fatigue: Pace yourself Plan your day Include naps and breaks get a little exercise fuel the body consider complementary therapies   -deep breathing   -prayer/medication      Brief HPI:    [name] is a 69 year old female with Multifocal bilateral glioblastoma multiforme facing de cions regarding treatment options.    ROS:  Weakness,    PMH:  Past Medical History  Diagnosis Date  . Venous insufficiency   . Venous stasis ulcer ([place])   . Diabetes mellitus due to underlying condition with diabetic nephropathy ([place])   . Neuropathy ([place])   . DJD (degenerative joint disease)   . Anemia   . Nephropathy,  diabetic ([place])   . Atrial fibrillation ([place])   . Depression   . Seizures ([place])     only 1 due to dehydration   . Cancer [facility])     uterine cancer  . Constipation   . Glaucoma   . Glioblastoma ([place])      PSH: Past Surgical History  Procedure Laterality Date  . Tonsilectomy, adenoidectomy, bilateral myringotomy and tubes    . Mouth surgery    . I&d extremity Left 01/14/2014    Procedure: IRRIGATION AND DEBRIDEMENT LEFT LOWER EXTREMITY POSSIBLE ACELL AND POSSIBLE VAC PLACEMENT;  Surgeon: Theodoro Kos, DO;  Location: [facility];  Service: Plastics;  Laterality: Left;  . Cervical ablation    . Brain biopsy Right 06/25/2015    Procedure: Right parietal stereotactic brain biopsy;  Surgeon: Consuella Lose, MD;  Location: [facility];  Service: Neurosurgery;  Laterality: Right;  Right parietal stereotactic brain biopsy   I have reviewed the FH and SH and  If appropriate update it with new information. Allergies  Allergen Reactions  . Bactrim [Sulfamethoxazole-Trimethoprim] Shortness Of Breath    Depression based reactions, altered mentality  . Sulfa  Antibiotics Shortness Of Breath and Swelling    Depression based reactions, altered mentality  . Vancomycin Shortness Of Breath, Itching, Swelling and Rash    Red man's syndrome  . Atorvastatin Itching and Other (See Comments)    myalgias  . Celebrex [Celecoxib] Swelling, Rash and Other (See Comments)  . Ibuprofen Hives and Rash  . Latex Rash  . Pioglitazone Rash and Swelling  . Neosporin [Neomycin-Bacitracin Zn-Polymyx] Rash   Scheduled Meds: Continuous Infusions: PRN Meds:.    There were no vitals taken for this visit.     No intake or output data in the 24 hours ending 07/05/15 1516   Physical Exam:  GENERAL:alert, no distress and comfortable SKIN: warm and dry, per family  Areas of broken skin   BLE Neuro: + word finding difficulty PSYCH: alert & oriented x 3     Labs: CBC    Component Value Date/Time   WBC 23.0* 06/25/2015 1250   RBC 4.70 06/25/2015 1250   HGB 13.8 06/25/2015 1250   HCT 38.2 06/25/2015 1250   PLT 276 06/25/2015 1250   MCV 81.3 06/25/2015 1250   MCH 29.4 06/25/2015 1250   MCHC 36.1* 06/25/2015 1250   RDW 14.1 06/25/2015 1250   LYMPHSABS 1.7 05/26/2015 1648   MONOABS 0.5 05/26/2015 1648   EOSABS 0.2 05/26/2015 1648   BASOSABS 0.0 05/26/2015 1648    BMET    Component Value Date/Time   NA 130* 06/25/2015 1250   K 3.0* 06/25/2015 1250   CL 93* 06/25/2015 1250   CO2 28 06/25/2015 1250   GLUCOSE 201* 06/25/2015 1250   BUN 33* 06/25/2015 1250   CREATININE 1.41* 06/25/2015 1250   CALCIUM 8.4* 06/25/2015 1250   GFRNONAA 37* 06/25/2015 1250   GFRAA 43* 06/25/2015 1250    CMP     Component Value Date/Time   NA 130* 06/25/2015 1250   K 3.0* 06/25/2015 1250   CL 93* 06/25/2015 1250   CO2 28 06/25/2015 1250   GLUCOSE 201* 06/25/2015 1250   BUN 33* 06/25/2015 1250   CREATININE 1.41* 06/25/2015 1250   CALCIUM 8.4* 06/25/2015 1250   PROT 7.9 05/26/2015 1648   ALBUMIN 3.5 05/26/2015 1648   AST 23 05/26/2015 1648   ALT 15 05/26/2015  1648   ALKPHOS 112 05/26/2015 1648   BILITOT 1.5* 05/26/2015 1648  GFRNONAA 37* 06/25/2015 1250   GFRAA 43* 06/25/2015 1250   ECOG PERFORMANCE STATUS* (Eastern Cooperative Oncology Group)  0 Fully active, able to continue with all pre-disease activities without restriction. Pt score  1 Restricted in physically strenuous activity but ambulatory and able to carry out work of a light or sedentary nature, e.g., light house work, office work.   2 Ambulatory and capable of all self-care but unable to carry out any work activities. Up and about more than 50% of waking hours.  2  3 Capable of only limited self-care. Confined to bed or chair more than 50% of waking hours.   4 Completely disabled. Cannot carry on any self-care. Totally confined to bed or chair.   5 Dead.    As published in Am. J. Clin. Oncol.: Eustace Pen, M.M., Colon Flattery., [place], D.C., [place], Sharen Hint., Drexel Iha, P.P.: Toxicity And Response Criteria Of The [facility]. [place] 4:034-742, 1982.  The ECOG Performance Status is in the public domain therefore available for public use. To duplicate the scale, please cite the reference above and credit the [facility], Tyler Pita M.D., Group Chair    Time In Time Out Total Time Spent with Patient Total Overall Time  1200 1315 75 min 75 min    Greater than 50%  of this time was spent counseling and coordinating care related to the above assessment and plan.   Wadie Lessen NP  Palliative Medicine Team Team Phone # [phone] Pager [phone]  Discussed with Dr  Tammi Klippel

## 2015-07-05 NOTE — Progress Notes (Signed)
[facility] CONSULT NOTE  Patient Care Team: Shirline Frees, MD as PCP - General (Family Medicine)  CHIEF COMPLAINTS/PURPOSE OF CONSULTATION:  Newly diagnosed glioblastoma multiforme  HISTORY OF PRESENTING ILLNESS:  [name] 69 y.o. female is here because of recent diagnosis of bilateral multifocal glioblastoma. She originally presented to the emergency room September 14 with slurred speech and difficulty writing. She had a prior history of TIA and her symptoms were felt to be related to that. But the CT of the head showed multiple intracranial lesions this was evaluated by an MRI that confirmed his masses or suspicious for glioma. She had CT chest abdomen pelvis which was normal. Brain MRI 07/11/2015 showed very large tumors especially 6.5 cm tumor in the corpus callosum with multiple satellite lesions. She saw neurosurgery who biopsied one of these lesions which came back as glioblastoma. She was seen by Dr. Tammi Klippel in radiation oncology and she is here today to discuss medical management of glioblastoma. She is accompanied by her entire family today.  I reviewed her records extensively and collaborated the history with the patient.  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 Venous insufficiency   . Venous stasis ulcer ([place])   . Diabetes mellitus due to underlying condition with diabetic nephropathy ([place])   . Neuropathy ([place])   . DJD (degenerative joint disease)   .  Anemia   . Nephropathy, diabetic ([place])   . Atrial fibrillation ([place])   . Depression   . Seizures ([place])     only 1 due to dehydration   . Cancer [facility])     uterine cancer  . Constipation   . Glaucoma   . Glioblastoma ([place])     SURGICAL HISTORY: Past Surgical History  Procedure Laterality Date  . Tonsilectomy, adenoidectomy, bilateral myringotomy and tubes    . Mouth surgery    . I&d extremity Left 01/14/2014    Procedure: IRRIGATION AND DEBRIDEMENT LEFT LOWER EXTREMITY POSSIBLE ACELL AND POSSIBLE VAC PLACEMENT;  Surgeon: Theodoro Kos, DO;  Location: [facility];  Service: Plastics;  Laterality: Left;  . Cervical ablation    . Brain biopsy Right 06/25/2015    Procedure: Right parietal stereotactic brain biopsy;  Surgeon: Consuella Lose, MD;  Location: [facility];  Service: Neurosurgery;  Laterality: Right;  Right parietal stereotactic brain biopsy    SOCIAL HISTORY: Social History   Social History  . Marital Status: Married    Spouse Name: N/A  . Number of Children: N/A  . Years of Education: N/A   Occupational History  . Not on file.   Social History Main Topics  . Smoking status: Never Smoker   . Smokeless tobacco: Never Used  . Alcohol Use: No  . Drug Use: No  . Sexual Activity: No   Other Topics Concern  . Not on file   Social History Narrative    FAMILY HISTORY: Family History  Problem Relation Age of Onset  . Heart disease Mother   . Heart disease Father   . Diabetes  Maternal Grandmother     ALLERGIES:  is allergic to bactrim; sulfa antibiotics; vancomycin; atorvastatin; celebrex; ibuprofen; latex; pioglitazone; and neosporin.  MEDICATIONS:  Current Outpatient Prescriptions  Medication Sig Dispense Refill  . ACCU-CHEK AVIVA PLUS test strip USE AS DIRECTED 4 TIMES A DAY  11  . acetaminophen (TYLENOL) 80 MG/0.8ML suspension Take 10 mg/kg by mouth every 4 (four) hours as needed for fever.    Marland Kitchen b complex vitamins tablet Take 1 tablet by mouth daily.    Marland Kitchen  bismuth subsalicylate (PEPTO BISMOL) 262 MG/15ML suspension Take 7.5 mLs by mouth every 6 (six) hours as needed for indigestion or diarrhea or loose stools.    . bumetanide (BUMEX) 2 MG tablet Take 1 mg by mouth daily.   6  . COD LIVER OIL PO Take 1 capsule by mouth daily.    Marland Kitchen CRANBERRY PO Take 1 tablet by mouth 2 (two) times daily.    Marland Kitchen dexamethasone (DECADRON) 4 MG tablet Take 1 tablet (4 mg total) by mouth daily. 20 tablet 0  . glipiZIDE (GLUCOTROL) 10 MG tablet Take 10 mg by mouth 2 (two) times daily before a meal.     . hydrochlorothiazide 25 MG tablet Take 25 mg by mouth daily.     . hydrOXYzine (ATARAX/VISTARIL) 50 MG tablet Take 50 mg by mouth 3 (three) times daily as needed for itching.     . insulin NPH-insulin regular (NOVOLIN 70/30) (70-30) 100 UNIT/ML injection Inject 80 Units into the skin 2 (two) times daily with a meal. Sliding scale    . insulin regular (NOVOLIN R,HUMULIN R) 100 units/mL injection Inject 0-20 Units into the skin daily as needed for high blood sugar. Sliding scale    . iron polysaccharides (NIFEREX) 150 MG capsule Take 150 mg by mouth 2 (two) times daily.    Marland Kitchen lactobacillus acidophilus (BACID) TABS tablet Take 1 tablet by mouth daily.    Marland Kitchen latanoprost (XALATAN) 0.005 % ophthalmic solution Place 1 drop into both eyes at bedtime.    . Multiple Vitamins-Minerals (MULTIVITAMIN WITH MINERALS) tablet Take 1 tablet by mouth daily.    Marland Kitchen nystatin cream (MYCOSTATIN) Apply 1 application topically daily as needed for dry skin.    Marland Kitchen OVER THE COUNTER MEDICATION Take 1 tablet by mouth 2 (two) times daily. Chlorellea    . potassium chloride (K-DUR) 10 MEQ tablet     . potassium chloride (K-DUR,KLOR-CON) 10 MEQ tablet Take 10 mEq by mouth 2 (two) times daily.    . timolol (TIMOPTIC) 0.5 % ophthalmic solution Place 1 drop into both eyes 2 (two) times daily.     . traMADol (ULTRAM) 50 MG tablet Take 50 mg by mouth every 6 (six) hours as needed for moderate pain.    . vitamin C  (ASCORBIC ACID) 500 MG tablet Take 500 mg by mouth 2 (two) times daily.      No current facility-administered medications for this visit.    REVIEW OF SYSTEMS:   Constitutional: Denies fevers, chills or abnormal night sweats Eyes: Denies blurriness of vision, double vision or watery eyes Ears, nose, mouth, throat, and face: Denies mucositis or sore throat Respiratory: Denies cough, dyspnea or wheezes Cardiovascular: Denies palpitation, chest discomfort or lower extremity swelling Gastrointestinal:  Denies nausea, heartburn or change in bowel habits Skin: Denies abnormal skin rashes Lymphatics: Denies new lymphadenopathy or easy bruising Neurological: Able to walk with the help of a cane, occasional slurred speech but majority of the speech is now normal. Word finding difficulty  occasionally. Reflexes are 3+ bilaterally Behavioral/Psych: Mood is stable, no new changes   All other systems were reviewed with the patient and are negative.  PHYSICAL EXAMINATION: ECOG PERFORMANCE STATUS: 2 - Symptomatic, <50% confined to bed  Filed Vitals:   07/05/15 1532  BP: 144/71  Temp: 97.6 F (36.4 C)  Resp: 17   Filed Weights   07/05/15 1532  Weight: 259 lb 12.8 oz (117.845 kg)    GENERAL:alert, no distress and comfortable SKIN: skin color, texture, turgor are normal, no rashes or significant lesions EYES: normal, conjunctiva are pink and non-injected, sclera clear OROPHARYNX:no exudate, no erythema and lips, buccal mucosa, and tongue normal  NECK: supple, thyroid normal size, non-tender, without nodularity LYMPH:  no palpable lymphadenopathy in the cervical, axillary or inguinal LUNGS: clear to auscultation and percussion with normal breathing effort HEART: regular rate & rhythm and no murmurs and no lower extremity edema ABDOMEN:abdomen soft, non-tender and normal bowel sounds Musculoskeletal:no cyanosis of digits and no clubbing  PSYCH: alert & oriented x 3 with fluent speech NEURO:  No facial paralysis, upper and lower extremity strength is intact. Reflexes are 3+ bilaterally.  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 Unresectable glioblastoma tend to have extremely poor prognosis. Her best treatment approach given the extent of her disease is combination of radiation with Temodar. Patient is scheduled to undergo 15 fractions of radiation therapy. I recommended adding concurrent temozolomide to the treatment.  Temozolomide counseling: Patient will receive 75 mg/m concurrently with radiation and 150 mg/m as maintenance therapy of radiation is complete days 1 through 5 every 4 weeks and maybe increasing up to 200 mg/m on cycle 2 on worse if well tolerated. I discussed with her that nausea, vomiting, decreased blood counts, fatigue of some of the complications of Temodar  I would like to see her 1 week after starting Temodar therapy. Patient is extremely difficult blood draw. We plan to check blood work as infrequently as possible.  Prognosis: I counseled him extensively that concurrent chemoradiation with Temodar cannot cure her disease. The goals of treatment are to prolongation of life and to keep disease in the control. Median survival for unresectable neoplasm or deformity is measured and only few months.  Plan to start treatment November 3rd and I will see her back on November 10.   All questions were answered. The patient knows to call the clinic with any  problems, questions or concerns.    Rulon Eisenmenger, MD 5:17 PM

## 2015-07-06 ENCOUNTER — Telehealth: Payer: Self-pay | Admitting: Hematology and Oncology

## 2015-07-06 ENCOUNTER — Other Ambulatory Visit: Payer: Self-pay | Admitting: Hematology and Oncology

## 2015-07-06 DIAGNOSIS — C711 Malignant neoplasm of frontal lobe: Secondary | ICD-10-CM

## 2015-07-06 MED ORDER — DAPSONE 100 MG PO TABS: 100.0000 mg | ORAL_TABLET | Freq: Every day | ORAL | Status: DC

## 2015-07-06 NOTE — Telephone Encounter (Signed)
s.w. pt and advised on NOV appt....ok adn aware

## 2015-07-08 ENCOUNTER — Ambulatory Visit
Admission: RE | Admit: 2015-07-08 | Discharge: 2015-07-08 | Disposition: A | Discharge status: Home / Self Care | Payer: Medicare Other | Source: Ambulatory Visit | Attending: Radiation Oncology | Admitting: Radiation Oncology

## 2015-07-08 ENCOUNTER — Telehealth: Payer: Self-pay | Admitting: Pharmacist

## 2015-07-08 DIAGNOSIS — D496 Neoplasm of unspecified behavior of brain: Secondary | ICD-10-CM

## 2015-07-08 DIAGNOSIS — Z5111 Encounter for antineoplastic chemotherapy: Secondary | ICD-10-CM | POA: Insufficient documentation

## 2015-07-08 DIAGNOSIS — Z510 Encounter for antineoplastic radiation therapy: Secondary | ICD-10-CM | POA: Diagnosis not present

## 2015-07-08 NOTE — Telephone Encounter (Signed)
10/27: attempted to reach patient for oral chemotherapy counseling. Unable to reach patient. Left voicemail. Will follow up with patient.   Thank you,  Montel Clock, PharmD, [facility]

## 2015-07-08 NOTE — Progress Notes (Signed)
  Radiation Oncology         [phone] ________________________________  Name: Konner Warrior MRN: [id number]  Date: 07/08/2015  DOB: [birth year]  SIMULATION AND TREATMENT PLANNING NOTE    ICD-9-CM ICD-10-CM   1. Brain tumor ([place]) 239.6 D49.6     DIAGNOSIS:  69 year old woman with a multifocal bi-hemispheric glioblastoma  NARRATIVE:  The patient was brought to the [facility].  Identity was confirmed.  All relevant records and images related to the planned course of therapy were reviewed.  The patient freely provided informed written consent to proceed with treatment after reviewing the details related to the planned course of therapy. The consent form was witnessed and verified by the simulation staff.  Then, the patient was set-up in a stable reproducible  supine position for radiation therapy.  CT images were obtained.  Surface markings were placed.  The CT images were loaded into the planning software.  Then the target and avoidance structures were contoured.  Treatment planning then occurred.  The radiation prescription was entered and confirmed.  Then, I designed and supervised the construction of a total of one medically necessary complex treatment device - thermoplastic mask.  I have requested : Intensity Modulated Radiotherapy (IMRT) is medically necessary for this case for the following reason:  Critical CNS structure avoidance - brainstem, optic chiasm, optic nerve..  I have ordered:Nutrition Consult  SPECIAL TREATMENT PROCEDURE:  The planned course of therapy using radiation constitutes a special treatment procedure. Special care is required in the management of this patient for the following reasons. This treatment constitutes a Special Treatment Procedure for the following reason: [ Concurrent chemotherapy requiring careful monitoring for increased toxicities of treatment including weekly laboratory values..  The special nature of the planned course of radiotherapy will  require increased physician supervision and oversight to ensure patient's safety with optimal treatment outcomes.  PLAN:  The patient will receive 40 Gy in 15 fraction.  ________________________________  Sheral Apley Tammi Klippel, M.D.

## 2015-07-12 ENCOUNTER — Other Ambulatory Visit (HOSPITAL_COMMUNITY)
Admission: RE | Admit: 2015-07-12 | Discharge: 2015-07-12 | Disposition: A | Discharge status: Home / Self Care | Payer: Medicare Other | Source: Ambulatory Visit | Attending: Radiation Oncology | Admitting: Radiation Oncology

## 2015-07-12 DIAGNOSIS — D496 Neoplasm of unspecified behavior of brain: Secondary | ICD-10-CM | POA: Diagnosis present

## 2015-07-13 DIAGNOSIS — Z510 Encounter for antineoplastic radiation therapy: Secondary | ICD-10-CM | POA: Diagnosis not present

## 2015-07-15 ENCOUNTER — Telehealth: Payer: Self-pay

## 2015-07-15 DIAGNOSIS — Z510 Encounter for antineoplastic radiation therapy: Secondary | ICD-10-CM | POA: Diagnosis not present

## 2015-07-15 MED ORDER — DEXAMETHASONE 4 MG PO TABS: 8.0000 mg | ORAL_TABLET | Freq: Every day | ORAL | Status: DC

## 2015-07-15 NOTE — Telephone Encounter (Signed)
Let daughter Gregary Signs know that Temodar is at the [facility] and can be picked up tomorrow.  Pt reports she will pick up Monday morning.    Provided Gregary Signs with instructions for taking the Temodar.   Per Gregary Signs, Dr. Tammi Klippel wanted dex dose increased to 8 mg/day.  [place] with Dr. Lindi Adie.  Refill sent.

## 2015-07-19 ENCOUNTER — Ambulatory Visit
Admission: RE | Admit: 2015-07-19 | Discharge: 2015-07-19 | Disposition: A | Discharge status: Home / Self Care | Payer: Medicare Other | Source: Ambulatory Visit | Attending: Radiation Oncology | Admitting: Radiation Oncology

## 2015-07-19 ENCOUNTER — Encounter (HOSPITAL_COMMUNITY): Payer: Self-pay

## 2015-07-19 ENCOUNTER — Encounter (HOSPITAL_BASED_OUTPATIENT_CLINIC_OR_DEPARTMENT_OTHER): Payer: Medicare Other | Attending: Plastic Surgery

## 2015-07-19 DIAGNOSIS — Z6841 Body Mass Index (BMI) 40.0 and over, adult: Secondary | ICD-10-CM | POA: Insufficient documentation

## 2015-07-19 DIAGNOSIS — Z510 Encounter for antineoplastic radiation therapy: Secondary | ICD-10-CM | POA: Diagnosis not present

## 2015-07-19 DIAGNOSIS — I890 Lymphedema, not elsewhere classified: Secondary | ICD-10-CM | POA: Insufficient documentation

## 2015-07-19 DIAGNOSIS — I83023 Varicose veins of left lower extremity with ulcer of ankle: Secondary | ICD-10-CM | POA: Diagnosis present

## 2015-07-19 DIAGNOSIS — L97321 Non-pressure chronic ulcer of left ankle limited to breakdown of skin: Secondary | ICD-10-CM | POA: Insufficient documentation

## 2015-07-20 ENCOUNTER — Ambulatory Visit
Admission: RE | Admit: 2015-07-20 | Discharge: 2015-07-20 | Disposition: A | Discharge status: Home / Self Care | Payer: Medicare Other | Source: Ambulatory Visit | Attending: Radiation Oncology | Admitting: Radiation Oncology

## 2015-07-20 DIAGNOSIS — Z510 Encounter for antineoplastic radiation therapy: Secondary | ICD-10-CM | POA: Diagnosis not present

## 2015-07-21 ENCOUNTER — Ambulatory Visit: Payer: Medicare Other

## 2015-07-21 DIAGNOSIS — Z510 Encounter for antineoplastic radiation therapy: Secondary | ICD-10-CM | POA: Diagnosis not present

## 2015-07-22 ENCOUNTER — Ambulatory Visit: Payer: Medicare Other

## 2015-07-22 ENCOUNTER — Ambulatory Visit
Admission: RE | Admit: 2015-07-22 | Discharge: 2015-07-22 | Disposition: A | Discharge status: Home / Self Care | Payer: Medicare Other | Source: Ambulatory Visit | Attending: Radiation Oncology | Admitting: Radiation Oncology

## 2015-07-22 ENCOUNTER — Telehealth: Payer: Self-pay | Admitting: Hematology and Oncology

## 2015-07-22 ENCOUNTER — Encounter: Payer: Self-pay | Admitting: Radiation Oncology

## 2015-07-22 ENCOUNTER — Ambulatory Visit (HOSPITAL_BASED_OUTPATIENT_CLINIC_OR_DEPARTMENT_OTHER): Payer: Medicare Other | Admitting: Hematology and Oncology

## 2015-07-22 ENCOUNTER — Encounter: Payer: Self-pay | Admitting: Hematology and Oncology

## 2015-07-22 VITALS — BP 144/71 | HR 89 | Temp 97.4°F | Resp 18 | Ht 67.0 in | Wt 277.9 lb

## 2015-07-22 VITALS — BP 123/68 | HR 76 | Resp 16 | Wt 278.4 lb

## 2015-07-22 DIAGNOSIS — C710 Malignant neoplasm of cerebrum, except lobes and ventricles: Secondary | ICD-10-CM | POA: Diagnosis not present

## 2015-07-22 DIAGNOSIS — C711 Malignant neoplasm of frontal lobe: Secondary | ICD-10-CM | POA: Diagnosis not present

## 2015-07-22 DIAGNOSIS — Z510 Encounter for antineoplastic radiation therapy: Secondary | ICD-10-CM | POA: Diagnosis not present

## 2015-07-22 MED ORDER — FLUCONAZOLE 100 MG PO TABS: 100.0000 mg | ORAL_TABLET | Freq: Every day | ORAL | Status: DC

## 2015-07-22 NOTE — Progress Notes (Addendum)
Weight gain noted. Vitals stable. Denies pain. Taking decadron 4 mg bid. Thrush noted. Reports a headache yesterday morning that resolved without medication. Denies dizziness, nausea, vomiting, diplopia or ringing in the ears. No skin changes noted within treatment field. Difficulty finding words. Oriented patient and family to staff and routine of the clinic. Provided patient with RADIATION THERAPY AND YOU handbook then, reviewed pertinent information. Educated patient reference potential side effects such as fatigue, skin changes, headache, and nausea. Provided patient with Biafine and directed upon use. Afforded the patient the opportunity to ask questions and answered those to the best of my ability. Patient and family verbalized understanding.        BP 123/68 mmHg  Pulse 76  Resp 16  Wt 278 lb 6.4 oz (126.281 kg)  SpO2 100% Wt Readings from Last 3 Encounters:  07/22/15 278 lb 6.4 oz (126.281 kg)  07/05/15 259 lb 12.8 oz (117.845 kg)  07/05/15 254 lb (115.214 kg)

## 2015-07-22 NOTE — Telephone Encounter (Signed)
Appointments made and avs printed for patient °

## 2015-07-22 NOTE — Assessment & Plan Note (Signed)
Multifocal bilateral glioblastoma multiforme: unresectable disease because of multiple intracranial lesions mainly involving the left frontal and posterior corpus callosum diagnosed 06/25/2015. Treatment plan: Concurrent chemoradiation with Temodar followed by maintenance temozolomide Current treatment: Concurrent chemoradiation with Temodar started 07/15/2015   Temodar toxicities:  Return to clinic in 2 weeks for follow-up

## 2015-07-22 NOTE — Progress Notes (Signed)
  Radiation Oncology         [phone]   Name: [name] MRN: [id number]   Date: 07/22/2015  DOB: 1946/03/28     Weekly Radiation Therapy Management    ICD-9-CM ICD-10-CM   1. 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  Weight gain noted. Vitals stable. Denies pain. Taking decadron 4 mg bid. Thrush noted. Reports a headache yesterday morning that resolved without medication. Denies dizziness, nausea, vomiting, diplopia or ringing in the ears. No skin changes noted within treatment field. Difficulty finding words. Patient requested to take only one day of steroids rather than two.  The patient is without complaint. Set-up films were reviewed. The chart was checked.  Physical Findings  weight is 278 lb 6.4 oz (126.281 kg). Her blood pressure is 123/68 and her pulse is 76. Her respiration is 16 and oxygen saturation is 100%. . Weight essentially stable.  No significant changes. Thrush noted.??  Impression The patient is tolerating radiation.  Plan Continue treatment as planned. I have advised that it would be okay to take steroids only one day rather than two. I have written her a diflucan prescription to resolve thrush.         Sheral Apley Tammi Klippel, M.D.  This document serves as a record of services personally performed by Tyler Pita, MD. It was created on his behalf by Arlyce Harman, a trained medical scribe. The creation of this record is based on the scribe's personal observations and the provider's statements to them. This document has been checked and approved by the attending provider.

## 2015-07-22 NOTE — Progress Notes (Signed)
Patient Care Team: Shirline Frees, MD as PCP - General (Family Medicine)  DIAGNOSIS: No matching staging information was found for the patient.  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 She was started on concurrent Temodar with radiation 07/15/2015. She is tolerated Temodar extremely well without any major problems or concerns. Denies any nausea or vomiting.  REVIEW OF SYSTEMS:   Constitutional: Denies fevers, chills or abnormal weight loss Eyes: Denies blurriness of vision Ears, nose, mouth, throat, and face: Denies mucositis or sore throat Respiratory: Denies cough, dyspnea or wheezes Cardiovascular: Denies palpitation, chest discomfort or lower extremity swelling Gastrointestinal:  Denies nausea, heartburn or change in bowel habits Skin: Denies abnormal skin rashes Lymphatics: Denies new lymphadenopathy or easy bruising Neurological: Speech issues, difficulty with ambulation requires a cane. Behavioral/Psych: Mood is stable, no new changes  All other systems were reviewed with the patient and are negative.  I have reviewed the past medical history, past surgical history, social history and family history with the patient and they are unchanged from  previous note.  ALLERGIES:  is allergic to bactrim; sulfa antibiotics; vancomycin; atorvastatin; celebrex; ibuprofen; latex; pioglitazone; and neosporin.  MEDICATIONS:  Current Outpatient Prescriptions  Medication Sig Dispense Refill  . ACCU-CHEK AVIVA PLUS test strip USE AS DIRECTED 4 TIMES A DAY  11  . acetaminophen (TYLENOL) 80 MG/0.8ML suspension Take 10 mg/kg by mouth every 4 (four) hours as needed for fever.    Marland Kitchen b complex vitamins tablet Take 1 tablet by mouth daily.    Marland Kitchen bismuth subsalicylate (PEPTO BISMOL) 262 MG/15ML suspension Take 7.5 mLs by mouth every 6 (six) hours as needed for indigestion or diarrhea or loose stools.    . bumetanide (BUMEX) 2 MG tablet Take 1 mg by mouth daily.   6  . COD LIVER OIL PO Take 1 capsule by mouth daily.    Marland Kitchen CRANBERRY PO Take 1 tablet by mouth 2 (two) times daily.    . dapsone 100 MG tablet Take 1 tablet (100 mg total) by mouth daily. Take with food to help prevent any nausea/vomiting 30 tablet 6  . dexamethasone (DECADRON) 4 MG tablet Take 2 tablets (8 mg total) by mouth daily. 40 tablet 0  . fluconazole (DIFLUCAN) 100 MG tablet Take 1 tablet (100 mg total) by mouth daily. Take 2 pills on the first day. 8 tablet 0  . glipiZIDE (GLUCOTROL) 10 MG tablet Take 10 mg by mouth 2 (two) times daily before a meal.     . hydrochlorothiazide 25 MG tablet Take 25 mg by mouth daily.     . hydrOXYzine (ATARAX/VISTARIL) 50 MG tablet Take 50 mg by mouth 3 (three) times daily as needed for itching.     . insulin NPH-insulin regular (NOVOLIN 70/30) (70-30) 100 UNIT/ML injection Inject 80 Units into the skin 2 (two) times daily  with a meal. Sliding scale    . insulin regular (NOVOLIN R,HUMULIN R) 100 units/mL injection Inject 0-20 Units into the skin daily as needed for high blood sugar. Sliding scale    . iron polysaccharides (NIFEREX) 150 MG capsule Take 150 mg by mouth 2 (two) times daily.    Marland Kitchen lactobacillus acidophilus (BACID) TABS tablet Take 1 tablet by mouth  daily.    Marland Kitchen latanoprost (XALATAN) 0.005 % ophthalmic solution Place 1 drop into both eyes at bedtime.    . Multiple Vitamins-Minerals (MULTIVITAMIN WITH MINERALS) tablet Take 1 tablet by mouth daily.    Marland Kitchen nystatin cream (MYCOSTATIN) Apply 1 application topically daily as needed for dry skin.    Marland Kitchen OVER THE COUNTER MEDICATION Take 1 tablet by mouth 2 (two) times daily. Chlorellea    . potassium chloride (K-DUR) 10 MEQ tablet     . potassium chloride (K-DUR,KLOR-CON) 10 MEQ tablet Take 10 mEq by mouth 2 (two) times daily.    Marland Kitchen temozolomide (TEMODAR) 180 MG capsule Take 1 capsule (180 mg total) by mouth daily. May take on an empty stomach or at bedtime to decrease nausea & vomiting. 15 capsule 0  . timolol (TIMOPTIC) 0.5 % ophthalmic solution Place 1 drop into both eyes 2 (two) times daily.     . traMADol (ULTRAM) 50 MG tablet Take 50 mg by mouth every 6 (six) hours as needed for moderate pain.    . vitamin C (ASCORBIC ACID) 500 MG tablet Take 500 mg by mouth 2 (two) times daily.      No current facility-administered medications for this visit.    PHYSICAL EXAMINATION: ECOG PERFORMANCE STATUS: 2 - Symptomatic, <50% confined to bed  Filed Vitals:   07/22/15 1527  BP: 144/71  Pulse: 89  Temp: 97.4 F (36.3 C)  Resp: 18   Filed Weights   07/22/15 1527  Weight: 277 lb 14.4 oz (126.055 kg)    GENERAL:alert, no distress and comfortable SKIN: skin color, texture, turgor are normal, no rashes or significant lesions EYES: normal, Conjunctiva are pink and non-injected, sclera clear OROPHARYNX:no exudate, no erythema and lips, buccal mucosa, and tongue normal  NECK: supple, thyroid normal size, non-tender, without nodularity LYMPH:  no palpable lymphadenopathy in the cervical, axillary or inguinal LUNGS: clear to auscultation and percussion with normal breathing effort HEART: regular rate & rhythm and no murmurs and no lower extremity edema ABDOMEN:abdomen soft, non-tender and normal bowel  sounds Musculoskeletal:no cyanosis of digits and no clubbing  NEURO: alert & oriented x 3 with fluent speech, no focal motor/sensory deficits  LABORATORY DATA:  I have reviewed the data as listed   Chemistry      Component Value Date/Time   NA 130* 06/25/2015 1250   K 3.0* 06/25/2015 1250   CL 93* 06/25/2015 1250   CO2 28 06/25/2015 1250   BUN 33* 06/25/2015 1250   CREATININE 1.41* 06/25/2015 1250      Component Value Date/Time   CALCIUM 8.4* 06/25/2015 1250   ALKPHOS 112 05/26/2015 1648   AST 23 05/26/2015 1648   ALT 15 05/26/2015 1648   BILITOT 1.5* 05/26/2015 1648       Lab Results  Component Value Date   WBC 23.0* 06/25/2015   HGB 13.8 06/25/2015   HCT 38.2 06/25/2015   MCV 81.3 06/25/2015   PLT 276 06/25/2015   NEUTROABS 4.2 05/26/2015   ASSESSMENT & PLAN:  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  Treatment plan: Concurrent chemoradiation with Temodar followed by maintenance temozolomide Current treatment: Concurrent chemoradiation with Temodar started 07/15/2015   Temodar toxicities: Denies any nausea vomiting Denies any fatigue Complains of occasional constipation: Uses stool softeners  Return to clinic in on January 3 to start maintenance Temodar.   No orders of the defined types were placed in this encounter.   The patient has a good understanding of the overall plan. she agrees with it. she will call with any problems that may develop before the next visit here.   Rulon Eisenmenger, MD 07/22/2015

## 2015-07-23 ENCOUNTER — Encounter (HOSPITAL_COMMUNITY): Payer: Self-pay

## 2015-07-23 ENCOUNTER — Ambulatory Visit: Payer: Medicare Other

## 2015-07-23 DIAGNOSIS — Z510 Encounter for antineoplastic radiation therapy: Secondary | ICD-10-CM | POA: Diagnosis not present

## 2015-07-26 ENCOUNTER — Ambulatory Visit: Payer: Medicare Other

## 2015-07-26 DIAGNOSIS — Z510 Encounter for antineoplastic radiation therapy: Secondary | ICD-10-CM | POA: Diagnosis not present

## 2015-07-27 ENCOUNTER — Ambulatory Visit: Payer: Medicare Other

## 2015-07-27 DIAGNOSIS — Z510 Encounter for antineoplastic radiation therapy: Secondary | ICD-10-CM | POA: Diagnosis not present

## 2015-07-28 ENCOUNTER — Ambulatory Visit: Payer: Medicare Other | Admitting: Radiation Oncology

## 2015-07-28 ENCOUNTER — Ambulatory Visit: Payer: Medicare Other

## 2015-07-28 DIAGNOSIS — Z510 Encounter for antineoplastic radiation therapy: Secondary | ICD-10-CM | POA: Diagnosis not present

## 2015-07-29 ENCOUNTER — Ambulatory Visit
Admission: RE | Admit: 2015-07-29 | Discharge: 2015-07-29 | Disposition: A | Discharge status: Home / Self Care | Payer: Medicare Other | Source: Ambulatory Visit | Attending: Radiation Oncology | Admitting: Radiation Oncology

## 2015-07-29 ENCOUNTER — Encounter: Payer: Self-pay | Admitting: Radiation Oncology

## 2015-07-29 ENCOUNTER — Ambulatory Visit: Payer: Medicare Other

## 2015-07-29 ENCOUNTER — Ambulatory Visit: Payer: Medicare Other | Admitting: Podiatry

## 2015-07-29 VITALS — BP 121/61 | HR 91 | Temp 97.4°F | Resp 20

## 2015-07-29 DIAGNOSIS — Z510 Encounter for antineoplastic radiation therapy: Secondary | ICD-10-CM | POA: Diagnosis not present

## 2015-07-29 DIAGNOSIS — C711 Malignant neoplasm of frontal lobe: Secondary | ICD-10-CM

## 2015-07-29 NOTE — Progress Notes (Signed)
  Radiation Oncology         [phone]   Name: [name] MRN: [id number]   Date: 07/29/2015  DOB: 02-22-1946     Weekly Radiation Therapy Management    ICD-9-CM ICD-10-CM   1. 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  No nausea. Pain in B/l legs Left >right. Wounds on legs. Taking 4 mg oral decadron once daily, wants to be tapered off. Not using sonafine yet. Scalp looks good, no skin changes. Occasional headaches in am, feels that it's sugar related.  The patient is without complaint. Set-up films were reviewed. The chart was checked.  Physical Findings  oral temperature is 97.4 F (36.3 C). Her blood pressure is 121/61 and her pulse is 91. Her respiration is 20 and oxygen saturation is 94%. . Weight essentially stable.  No significant changes.   Impression The patient is tolerating radiation.  Plan Continue treatment as planned. I have advised that it is okay to break her current decadron pills in two to try 2 mg daily with a plan to ultimately discontinue steroid use on 11/28. She will not be receiving treatment on Sunday, 11/20.         Sheral Apley Tammi Klippel, M.D.  This document serves as a record of services personally performed by Tyler Pita, MD. It was created on his behalf by Arlyce Harman, a trained medical scribe. The creation of this record is based on the scribe's personal observations and the provider's statements to them. This document has been checked and approved by the attending provider.

## 2015-07-29 NOTE — Progress Notes (Signed)
Weekly rad txs whole brain, no nausea, pain in  B/l legs Left >right, wounds on legs,  taking 4 mg oral  decadron daily wants to be tapered off, not using sonafine yet, scalp looks good no skin changes,  Occasional head aches in am, feels sugar related,  2:46 PM BP 121/61 mmHg  Pulse 91  Temp(Src) 97.4 F (36.3 C) (Oral)  Resp 20  SpO2 94%  Wt Readings from Last 3 Encounters:  07/22/15 277 lb 14.4 oz (126.055 kg)  07/22/15 278 lb 6.4 oz (126.281 kg)  07/05/15 259 lb 12.8 oz (117.845 kg)

## 2015-07-30 ENCOUNTER — Ambulatory Visit: Payer: Medicare Other

## 2015-07-30 DIAGNOSIS — Z510 Encounter for antineoplastic radiation therapy: Secondary | ICD-10-CM | POA: Diagnosis not present

## 2015-08-02 ENCOUNTER — Ambulatory Visit
Admission: RE | Admit: 2015-08-02 | Discharge: 2015-08-02 | Disposition: A | Discharge status: Home / Self Care | Payer: Medicare Other | Source: Ambulatory Visit | Attending: Radiation Oncology | Admitting: Radiation Oncology

## 2015-08-02 DIAGNOSIS — Z515 Encounter for palliative care: Secondary | ICD-10-CM | POA: Insufficient documentation

## 2015-08-02 DIAGNOSIS — Z510 Encounter for antineoplastic radiation therapy: Secondary | ICD-10-CM | POA: Diagnosis not present

## 2015-08-02 DIAGNOSIS — Z7189 Other specified counseling: Secondary | ICD-10-CM | POA: Insufficient documentation

## 2015-08-02 DIAGNOSIS — R530 Neoplastic (malignant) related fatigue: Secondary | ICD-10-CM | POA: Insufficient documentation

## 2015-08-02 NOTE — Addendum Note (Signed)
Encounter addended by: Knox Royalty, NP on: 08/02/2015 11:32 AM<BR>     Documentation filed: Charges VN, Problem List, Clinical Notes

## 2015-08-03 ENCOUNTER — Ambulatory Visit
Admission: RE | Admit: 2015-08-03 | Discharge: 2015-08-03 | Disposition: A | Discharge status: Home / Self Care | Payer: Medicare Other | Source: Ambulatory Visit | Attending: Radiation Oncology | Admitting: Radiation Oncology

## 2015-08-03 ENCOUNTER — Encounter: Payer: Self-pay | Admitting: Radiation Oncology

## 2015-08-03 VITALS — BP 125/75 | HR 86 | Resp 16 | Wt 272.3 lb

## 2015-08-03 DIAGNOSIS — C711 Malignant neoplasm of frontal lobe: Secondary | ICD-10-CM | POA: Insufficient documentation

## 2015-08-03 DIAGNOSIS — Z510 Encounter for antineoplastic radiation therapy: Secondary | ICD-10-CM | POA: Diagnosis not present

## 2015-08-03 MED ORDER — EMOLLIENT BASE EX CREA: TOPICAL_CREAM | CUTANEOUS | Status: DC | PRN

## 2015-08-03 MED ORDER — BIAFINE EX EMUL
Freq: Once | CUTANEOUS | Status: AC
  Administered 2015-08-03: 10:00:00 via TOPICAL

## 2015-08-03 NOTE — Addendum Note (Signed)
Encounter addended by: Heywood Footman, RN on: 08/03/2015  9:46 AM<BR>     Documentation filed: Dx Association, Inpatient MAR, Orders

## 2015-08-03 NOTE — Progress Notes (Signed)
Department of Radiation Oncology  Phone:  [phone] Fax:        [phone]  Weekly Treatment Note    Name: [name] Date: 08/03/2015 MRN: [id number] DOB: 1945/12/26   Current dose: 32 Gy  Current fraction: 12   MEDICATIONS: Current Outpatient Prescriptions  Medication Sig Dispense Refill  . ACCU-CHEK AVIVA PLUS test strip USE AS DIRECTED 4 TIMES A DAY  11  . acetaminophen (TYLENOL) 80 MG/0.8ML suspension Take 10 mg/kg by mouth every 4 (four) hours as needed for fever.    Marland Kitchen b complex vitamins tablet Take 1 tablet by mouth daily.    Marland Kitchen bismuth subsalicylate (PEPTO BISMOL) 262 MG/15ML suspension Take 7.5 mLs by mouth every 6 (six) hours as needed for indigestion or diarrhea or loose stools.    . bumetanide (BUMEX) 2 MG tablet Take 1 mg by mouth daily.   6  . COD LIVER OIL PO Take 1 capsule by mouth daily.    Marland Kitchen CRANBERRY PO Take 1 tablet by mouth 2 (two) times daily.    . dapsone 100 MG tablet Take 1 tablet (100 mg total) by mouth daily. Take with food to help prevent any nausea/vomiting 30 tablet 6  . dexamethasone (DECADRON) 4 MG tablet Take 2 tablets (8 mg total) by mouth daily. 40 tablet 0  . glipiZIDE (GLUCOTROL) 10 MG tablet Take 10 mg by mouth 2 (two) times daily before a meal.     . hydrochlorothiazide 25 MG tablet Take 25 mg by mouth daily.     . hydrOXYzine (ATARAX/VISTARIL) 50 MG tablet Take 50 mg by mouth 3 (three) times daily as needed for itching.     . insulin NPH-insulin regular (NOVOLIN 70/30) (70-30) 100 UNIT/ML injection Inject 80 Units into the skin 2 (two) times daily with a meal. Sliding scale    . insulin regular (NOVOLIN R,HUMULIN R) 100 units/mL injection Inject 0-20 Units into the skin daily as needed for high blood sugar. Sliding scale    . iron polysaccharides (NIFEREX) 150 MG capsule Take 150 mg by mouth 2 (two) times daily.    Marland Kitchen lactobacillus acidophilus (BACID) TABS tablet Take 1 tablet by mouth daily.    Marland Kitchen latanoprost (XALATAN) 0.005 %  ophthalmic solution Place 1 drop into both eyes at bedtime.    . Multiple Vitamins-Minerals (MULTIVITAMIN WITH MINERALS) tablet Take 1 tablet by mouth daily.    Marland Kitchen nystatin cream (MYCOSTATIN) Apply 1 application topically daily as needed for dry skin.    Marland Kitchen OVER THE COUNTER MEDICATION Take 1 tablet by mouth 2 (two) times daily. Chlorellea    . potassium chloride (K-DUR,KLOR-CON) 10 MEQ tablet Take 10 mEq by mouth 2 (two) times daily.    Marland Kitchen temozolomide (TEMODAR) 180 MG capsule Take 1 capsule (180 mg total) by mouth daily. May take on an empty stomach or at bedtime to decrease nausea & vomiting. 15 capsule 0  . timolol (TIMOPTIC) 0.5 % ophthalmic solution Place 1 drop into both eyes 2 (two) times daily.     . traMADol (ULTRAM) 50 MG tablet Take 50 mg by mouth every 6 (six) hours as needed for moderate pain.    . vitamin C (ASCORBIC ACID) 500 MG tablet Take 500 mg by mouth 2 (two) times daily.     . fluconazole (DIFLUCAN) 100 MG tablet Take 1 tablet (100 mg total) by mouth daily. Take 2 pills on the first day. (Patient not taking: Reported on 08/03/2015) 8 tablet 0   No current facility-administered medications  for this encounter.     ALLERGIES: Bactrim; Sulfa antibiotics; Vancomycin; Atorvastatin; Celebrex; Ibuprofen; Latex; Pioglitazone; and Neosporin   LABORATORY DATA:  Lab Results  Component Value Date   WBC 23.0* 06/25/2015   HGB 13.8 06/25/2015   HCT 38.2 06/25/2015   MCV 81.3 06/25/2015   PLT 276 06/25/2015   Lab Results  Component Value Date   NA 130* 06/25/2015   K 3.0* 06/25/2015   CL 93* 06/25/2015   CO2 28 06/25/2015   Lab Results  Component Value Date   ALT 15 05/26/2015   AST 23 05/26/2015   ALKPHOS 112 05/26/2015   BILITOT 1.5* 05/26/2015     NARRATIVE: [name] was seen today for weekly treatment management. The chart was checked and the patient's films were reviewed.  Weight and vitals stable. Denies pain. Reports a mild headache following treatment each  day. Reports taking decadron 2 mg once daily. Ritta Slot has resolved. Denies nausea, vomiting, dizziness, diplopia or ringing in the ears. Edema of feet and face noted. One month follow up appointment card given.   BP 125/75 mmHg  Pulse 86  Resp 16  Wt 272 lb 4.8 oz (123.514 kg)  SpO2 100% Wt Readings from Last 3 Encounters:  08/03/15 272 lb 4.8 oz (123.514 kg)  07/22/15 277 lb 14.4 oz (126.055 kg)  07/22/15 278 lb 6.4 oz (126.281 kg)     PHYSICAL EXAMINATION: weight is 272 lb 4.8 oz (123.514 kg). Her blood pressure is 125/75 and her pulse is 86. Her respiration is 16 and oxygen saturation is 100%.      no thrush present  ASSESSMENT: The patient is doing satisfactorily with treatment.  PLAN: We will continue with the patient's radiation treatment as planned. The patient has instructions with regards to further tapering her steroids. She appears to have done very well during treatment and will finish next week. Follow-up with Dr. Tammi Klippel in one month.

## 2015-08-03 NOTE — Addendum Note (Signed)
Encounter addended by: Heywood Footman, RN on: 08/03/2015  9:06 AM<BR>     Documentation filed: Notes Section, Orders

## 2015-08-03 NOTE — Progress Notes (Signed)
Weight and vitals stable. Denies pain. Reports a mild headache following treatment each day. Reports taking decadron 2 mg once daily. [name] has resolved. Denies nausea, vomiting, dizziness, diplopia or ringing in the ears. Edema of feet and face noted. One month follow up appointment card given.   BP 125/75 mmHg  Pulse 86  Resp 16  Wt 272 lb 4.8 oz (123.514 kg)  SpO2 100% Wt Readings from Last 3 Encounters:  08/03/15 272 lb 4.8 oz (123.514 kg)  07/22/15 277 lb 14.4 oz (126.055 kg)  07/22/15 278 lb 6.4 oz (126.281 kg)

## 2015-08-04 ENCOUNTER — Ambulatory Visit
Admission: RE | Admit: 2015-08-04 | Discharge: 2015-08-04 | Disposition: A | Discharge status: Home / Self Care | Payer: Medicare Other | Source: Ambulatory Visit | Attending: Radiation Oncology | Admitting: Radiation Oncology

## 2015-08-04 DIAGNOSIS — Z510 Encounter for antineoplastic radiation therapy: Secondary | ICD-10-CM | POA: Diagnosis not present

## 2015-08-09 ENCOUNTER — Ambulatory Visit
Admission: RE | Admit: 2015-08-09 | Discharge: 2015-08-09 | Disposition: A | Discharge status: Home / Self Care | Payer: Medicare Other | Source: Ambulatory Visit | Attending: Radiation Oncology | Admitting: Radiation Oncology

## 2015-08-09 ENCOUNTER — Ambulatory Visit: Admission: RE | Admit: 2015-08-09 | Payer: Medicare Other | Source: Ambulatory Visit

## 2015-08-10 ENCOUNTER — Ambulatory Visit
Admission: RE | Admit: 2015-08-10 | Discharge: 2015-08-10 | Disposition: A | Discharge status: Home / Self Care | Payer: Medicare Other | Source: Ambulatory Visit | Attending: Radiation Oncology | Admitting: Radiation Oncology

## 2015-08-10 ENCOUNTER — Ambulatory Visit: Payer: Medicare Other

## 2015-08-10 DIAGNOSIS — Z510 Encounter for antineoplastic radiation therapy: Secondary | ICD-10-CM | POA: Diagnosis not present

## 2015-08-11 ENCOUNTER — Encounter: Payer: Self-pay | Admitting: Radiation Oncology

## 2015-08-11 ENCOUNTER — Ambulatory Visit
Admission: RE | Admit: 2015-08-11 | Discharge: 2015-08-11 | Disposition: A | Discharge status: Home / Self Care | Payer: Medicare Other | Source: Ambulatory Visit | Attending: Radiation Oncology | Admitting: Radiation Oncology

## 2015-08-11 DIAGNOSIS — Z510 Encounter for antineoplastic radiation therapy: Secondary | ICD-10-CM | POA: Diagnosis not present

## 2015-08-11 NOTE — Progress Notes (Signed)
Patient and family presented to the clinic following final radiation treatment. Patient has one month follow up appointment arranged. Also, patient took last decadron tablet this morning. Patient and family understand to contact this RN should any regression occur. No thrush noted. Clarified for patient and family that a brain scan will be arranged in three month to determine her response to radiation. All questions answered to the best of my ability. Patient and family denied any further needs.

## 2015-08-16 ENCOUNTER — Encounter (HOSPITAL_BASED_OUTPATIENT_CLINIC_OR_DEPARTMENT_OTHER): Payer: Medicare Other | Attending: Plastic Surgery

## 2015-08-16 DIAGNOSIS — L97821 Non-pressure chronic ulcer of other part of left lower leg limited to breakdown of skin: Secondary | ICD-10-CM | POA: Diagnosis not present

## 2015-08-16 DIAGNOSIS — Z85841 Personal history of malignant neoplasm of brain: Secondary | ICD-10-CM | POA: Diagnosis not present

## 2015-08-16 DIAGNOSIS — I890 Lymphedema, not elsewhere classified: Secondary | ICD-10-CM | POA: Insufficient documentation

## 2015-08-16 DIAGNOSIS — Z6841 Body Mass Index (BMI) 40.0 and over, adult: Secondary | ICD-10-CM | POA: Diagnosis not present

## 2015-08-16 DIAGNOSIS — I83028 Varicose veins of left lower extremity with ulcer other part of lower leg: Secondary | ICD-10-CM | POA: Insufficient documentation

## 2015-08-16 DIAGNOSIS — Z923 Personal history of irradiation: Secondary | ICD-10-CM | POA: Diagnosis not present

## 2015-08-16 DIAGNOSIS — Z9221 Personal history of antineoplastic chemotherapy: Secondary | ICD-10-CM | POA: Diagnosis not present

## 2015-08-25 ENCOUNTER — Ambulatory Visit (INDEPENDENT_AMBULATORY_CARE_PROVIDER_SITE_OTHER): Payer: Medicare Other | Admitting: Podiatry

## 2015-08-25 ENCOUNTER — Encounter: Payer: Self-pay | Admitting: Podiatry

## 2015-08-25 ENCOUNTER — Telehealth: Payer: Self-pay | Admitting: *Deleted

## 2015-08-25 VITALS — BP 102/66 | HR 84 | Temp 97.2°F | Resp 16

## 2015-08-25 DIAGNOSIS — L89891 Pressure ulcer of other site, stage 1: Secondary | ICD-10-CM

## 2015-08-25 DIAGNOSIS — L97509 Non-pressure chronic ulcer of other part of unspecified foot with unspecified severity: Principal | ICD-10-CM

## 2015-08-25 DIAGNOSIS — E11621 Type 2 diabetes mellitus with foot ulcer: Secondary | ICD-10-CM | POA: Diagnosis not present

## 2015-08-25 NOTE — Telephone Encounter (Addendum)
[name] son, Sandi Mealy called concerning pt.  Sandi Mealy states pt's toe looks like a mountain lion got to it, that she had it removed by a doctor, but was not certain of the date.  Sandi Mealy states pt's husband has been changing the dressings randomly.  I offered an appt, Sandi Mealy states his sister likes to go to pt's appt.  I asked if he would like to make an appt now, and he agreed, transferred pt to schedulers.  08/26/2015 Sandi Mealy wanted to know the diagnosis from his mother's office visit yesterday, his sister could not remember.  I told him ulcer due to diabetes.  Sandi Mealy states there will be home health care to take care of her beginning tomorrow.

## 2015-08-25 NOTE — Progress Notes (Signed)
Subjective:     Patient ID: [name], female   DOB: Dec 14, 1945, 69 y.o.   MRN: [id number]  HPI this patient presents to the office with chief complaint of a skin lesion on the tip of her big toe, right foot patient is brought into the office by her daughter. She says that she is under treatment for the wound care center for her ulcer left leg. She states someone besides herself was responsible for the care of the right foot and leg. She states that she noted there was a skin breakdown yesterday and she questioned the caretaker who  stated he never on the lesion was there the caretaker.  Therefore according to the daughter it could have been there for weeks. There is drainage from the skin site but no pain noted.  She has history of her nail being debrided by myself since the nail was unattached at her previous visit. She is also experiencing brain tumor and treatment. She presents for evaluation and treatment.   Review of Systems     Objective:   Physical Exam Physical Exam GENERAL APPEARANCE: Alert, conversant. Appropriately groomed. No acute distress.  VASCULAR: Pedal pulses palpable at [facility] and PT right foot. The pedal pulses are not palpated due to unna boot. Capillary refill time is immediate to all digits, Normal temperature gradient. NEUROLOGIC: sensation is absent to 5.07 monofilament at 5/5 sites right foot. Light touch is intact bilateral, Muscle strength normal.  MUSCULOSKELETAL: acceptable muscle strength, tone and stability bilateral. Intrinsic muscluature intact bilateral. Rectus appearance of foot and digits noted bilateral.   DERMATOLOGIC: skin color, texture, and turgor are within normal limits.  no interdigital maceration noted. . Digital nails are asymptomatic.  There is unattached nail plate right hallux has regrown. Ulcer  There is diabetic ulcer on the distal aspect right hallux.  There is breakdown of skin exposing subcutaneous tissue.  There is no malodor or  infection or pus noted.The ulcer measures 1 cm x 1 cm.    Assessment:     Diabetic ulcer right hallux    Plan:     ROV  Debride necrotic tissue ulcer right hallux.  Silvadene applied to ulcer then DSD.  Home instructions given for soaks and rebandaging.  The daughter was concerned about having her mother return to my office for follow up care.  I then suggested she have  the wound care people who work on her left leg work on her toe ulcer. .  Also told her not to wear her shoe since that was an   Instigating factor in ulcer formation.  I gave her an appointment in one week.as needed.  Gardiner Barefoot DPM

## 2015-08-25 NOTE — Progress Notes (Deleted)
Subjective:     Patient ID: [name], female   DOB: 12/28/1945, 69 y.o.   MRN: [id number]  HPIThis patient presents to the office per recommendation of her daughter for her unattached nails on her first and second toe right foot. She is diabetic under care for ulcer left leg for which she has unna boot on left leg.  She says her nails have become unattached and formed dead tissue under her nails.  No pain or drainage noted.  She is diabetic and she is concerned about her nails at this time.   Review of Systems     Objective:   Physical Exam GENERAL APPEARANCE: Alert, conversant. Appropriately groomed. No acute distress.  VASCULAR: Pedal pulses palpable at  DP and PT right foot.  The pedal pulses are not palpated due to unna boot.  Capillary refill time is immediate to all digits,  Normal temperature gradient. NEUROLOGIC: sensation is normal to 5.07 monofilament at 5/5 sites bilateral.  Light touch is intact bilateral, Muscle strength normal.  MUSCULOSKELETAL: acceptable muscle strength, tone and stability bilateral.  Intrinsic muscluature intact bilateral.  Rectus appearance of foot and digits noted bilateral.   DERMATOLOGIC: skin color, texture, and turgor are within normal limits.  No preulcerative lesions or ulcers  are seen, no interdigital maceration noted.  No open lesions present.  Digital nails are asymptomatic. No drainage noted. There is unattached nail plate right hallux with no pain redness or drainage.      Assessment:     Onychomycosis     Plan:     IE  Debridement of Nails 1,2 right foot.  RTC 3 months       

## 2015-09-01 NOTE — Progress Notes (Signed)
  Radiation Oncology         [phone]) [phone] ________________________________  Name: [name] MRN: [id number]  Date: 08/11/2015  DOB: 06-Jan-1946  End of Treatment Note  Diagnosis:   69 year old woman with a multifocal bi-hemispheric glioblastoma.     Indication for treatment:  Palliation       Radiation treatment dates:   07/19/15-08/11/15  Site/dose:   The patient's glioblastoma was treated to 40.005 Gy in 15 fractions of 2.667 Gy.  Beams/energy:   The initial enhancing tumor, peritumoral edema, plus 1 cm were treated using helical intensity modulated radiotherapy delivering 6 megavolt photons. Image guidance was performed with megavoltage CT studies prior to each fraction. He was immobilized with a thermoplastic mask.  Narrative: The patient tolerated radiation treatment relatively well.   She reported a mild headache following treatment each day. Reported taking decadron 2 mg once daily. [name] was treated. Denied nausea, vomiting, dizziness, diplopia or ringing in the ears. Edema of feet and face were noted.   Plan: The patient has completed radiation treatment. The patient will return to radiation oncology clinic for routine followup in one month. I advised her to call or return sooner if she has any questions or concerns related to her recovery or treatment. ________________________________  Sheral Apley. Tammi Klippel, M.D.

## 2015-09-02 ENCOUNTER — Ambulatory Visit: Payer: Medicare Other | Admitting: Podiatry

## 2015-09-09 ENCOUNTER — Ambulatory Visit
Admission: RE | Admit: 2015-09-09 | Discharge: 2015-09-09 | Disposition: A | Discharge status: Home / Self Care | Payer: Medicare Other | Source: Ambulatory Visit | Attending: Radiation Oncology | Admitting: Radiation Oncology

## 2015-09-09 ENCOUNTER — Other Ambulatory Visit: Payer: Self-pay | Admitting: Radiation Therapy

## 2015-09-09 ENCOUNTER — Encounter: Payer: Self-pay | Admitting: Radiation Oncology

## 2015-09-09 VITALS — BP 130/72 | HR 83 | Resp 16

## 2015-09-09 DIAGNOSIS — C711 Malignant neoplasm of frontal lobe: Secondary | ICD-10-CM

## 2015-09-09 DIAGNOSIS — D496 Neoplasm of unspecified behavior of brain: Secondary | ICD-10-CM

## 2015-09-09 DIAGNOSIS — B370 Candidal stomatitis: Secondary | ICD-10-CM

## 2015-09-09 MED ORDER — FLUCONAZOLE 100 MG PO TABS: 100.0000 mg | ORAL_TABLET | Freq: Every day | ORAL | Status: DC

## 2015-09-09 MED ORDER — DEXAMETHASONE 4 MG PO TABS: 2.0000 mg | ORAL_TABLET | Freq: Two times a day (BID) | ORAL | Status: DC

## 2015-09-09 NOTE — Progress Notes (Signed)
Vitals stable. Patient unsteady on her feet and refusing weight. Denies pain. Denies headache, dizziness, nausea, vomiting, diplopia or ringing in the ears. Alopecia of scalp noted. Scalp without hyperpigmentation or desquamation. Reports she is moisturizing her scalp daily with over the counter moisturizer. Son reports the patient tapered off decadron then, she began to loose function of her right hand plus she was unable to speak more than a word or two. Son reports he started his mother back on decadron 2 mg daily and function of her right hand has greatly improved as well as her ability to communicate. Requesting refill of decadron. Questioning thrush of right buccal mucosa. Scheduled to follow up with Dr. Lindi Adie on 1/3.   BP 130/72 mmHg  Pulse 83  Resp 16  Wt   SpO2 100% Wt Readings from Last 3 Encounters:  08/03/15 272 lb 4.8 oz (123.514 kg)  07/22/15 277 lb 14.4 oz (126.055 kg)  07/22/15 278 lb 6.4 oz (126.281 kg)

## 2015-09-09 NOTE — Progress Notes (Signed)
Radiation Oncology         [phone] ________________________________  Name: [name] MRN: [id number]  Date: 09/09/2015  DOB: [birth year]    Follow-Up Visit Note  CC: Shirline Frees, MD  Consuella Lose, MD  Diagnosis:   69 year old woman with a multifocal bi-hemispheric glioblastoma.    ICD-9-CM ICD-10-CM   1. Oral candidiasis 112.0 B37.0 fluconazole (DIFLUCAN) 100 MG tablet  2. 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  Vitals stable. Patient unsteady on her feet and refusing weight. Denies pain. Denies headache, dizziness, nausea, vomiting, diplopia or ringing in the ears. Alopecia of scalp noted. Scalp without hyperpigmentation or desquamation. Reports she is moisturizing her scalp daily with over the counter moisturizer. Son reports the patient tapered off decadron then, she began to lose function of her right hand plus she was unable to speak more than a word or two. Son reports he started his mother back on decadron 2 mg daily and function of her right hand has greatly improved as well as her ability to communicate. Requesting refill of decadron. Questioning thrush of right buccal mucosa. Scheduled to follow up with Dr. Lindi Adie on 1/3.    ALLERGIES:  is allergic to bactrim; sulfa antibiotics; vancomycin; atorvastatin; celebrex; ibuprofen; latex; other; pioglitazone; and neosporin.  Meds: Current Outpatient Prescriptions  Medication Sig Dispense Refill  . ACCU-CHEK AVIVA PLUS test strip USE AS DIRECTED 4 TIMES A DAY  11  . acetaminophen (TYLENOL) 80 MG/0.8ML suspension Take 10 mg/kg by mouth every 4 (four) hours as needed for fever.    Marland Kitchen b complex vitamins tablet Take 1 tablet by mouth daily.    Marland Kitchen bismuth subsalicylate (PEPTO BISMOL) 262 MG/15ML suspension Take 7.5 mLs  by mouth every 6 (six) hours as needed for indigestion or diarrhea or loose stools.    . bumetanide (BUMEX) 2 MG tablet Take 1 mg by mouth daily.   6  . CRANBERRY PO Take 1 tablet by mouth 2 (two) times daily.    . dapsone 100 MG tablet Take 1 tablet (100 mg total) by mouth daily. Take with food to help prevent any nausea/vomiting 30 tablet 6  . dexamethasone (DECADRON) 4 MG tablet Take 2 tablets (8 mg total) by mouth daily. 40 tablet 0  . glipiZIDE (GLUCOTROL) 10 MG tablet Take 10 mg by mouth 2 (two) times daily before a meal.     . hydrochlorothiazide 25 MG tablet Take 25 mg by mouth daily.     . hydrOXYzine (ATARAX/VISTARIL) 50 MG tablet Take 50 mg by mouth 3 (three) times daily as needed for itching.     . insulin NPH-insulin regular (NOVOLIN 70/30) (70-30) 100 UNIT/ML injection Inject 80 Units into the skin 2 (two) times daily with a meal. Sliding scale    . insulin regular (NOVOLIN R,HUMULIN R) 100 units/mL injection Inject 0-20 Units into the skin daily as needed for high blood sugar. Sliding scale    . iron polysaccharides (NIFEREX) 150 MG capsule Take 150 mg by mouth 2 (two) times daily.    Marland Kitchen lactobacillus acidophilus (BACID) TABS tablet Take 1 tablet by mouth daily.    Marland Kitchen latanoprost (XALATAN) 0.005 % ophthalmic solution Place 1 drop into both eyes at bedtime.    . Multiple Vitamins-Minerals (MULTIVITAMIN WITH MINERALS) tablet Take 1 tablet by mouth  daily.    . nystatin cream (MYCOSTATIN) Apply 1 application topically daily as needed for dry skin.    Marland Kitchen OVER THE COUNTER MEDICATION Take 1 tablet by mouth 2 (two) times daily. Chlorellea    . potassium chloride (K-DUR,KLOR-CON) 10 MEQ tablet Take 10 mEq by mouth 2 (two) times daily.    . timolol (TIMOPTIC) 0.5 % ophthalmic solution Place 1 drop into both eyes 2 (two) times daily.     . traMADol (ULTRAM) 50 MG tablet Take 50 mg by mouth every 6 (six) hours as needed for moderate pain.    . vitamin C (ASCORBIC ACID) 500 MG tablet Take 500 mg by  mouth 2 (two) times daily.     . COD LIVER OIL PO Take 1 capsule by mouth daily. Reported on 09/09/2015    . fluconazole (DIFLUCAN) 100 MG tablet Take 1 tablet (100 mg total) by mouth daily. Take 2 pills on the first day. 8 tablet 0  . temozolomide (TEMODAR) 180 MG capsule Take 1 capsule (180 mg total) by mouth daily. May take on an empty stomach or at bedtime to decrease nausea & vomiting. (Patient not taking: Reported on 09/09/2015) 15 capsule 0   No current facility-administered medications for this encounter.    Physical Findings: The patient is in no acute distress. Patient is alert and oriented.  blood pressure is 130/72 and her pulse is 83. Her respiration is 16 and oxygen saturation is 100%. .  No significant changes. Thrush noted. Cushingoid features.  Lab Findings: Lab Results  Component Value Date   WBC 23.0* 06/25/2015   HGB 13.8 06/25/2015   HCT 38.2 06/25/2015   PLT 276 06/25/2015    Lab Results  Component Value Date   NA 130* 06/25/2015   K 3.0* 06/25/2015   CO2 28 06/25/2015   GLUCOSE 201* 06/25/2015   BUN 33* 06/25/2015   CREATININE 1.41* 06/25/2015   BILITOT 1.5* 05/26/2015   ALKPHOS 112 05/26/2015   AST 23 05/26/2015   ALT 15 05/26/2015   PROT 7.9 05/26/2015   ALBUMIN 3.5 05/26/2015   CALCIUM 8.4* 06/25/2015   ANIONGAP 9 06/25/2015    Radiographic Findings: No results found.  Impression:  The patient is recovering from the effects of radiation. She remains steroid dependent. She has some thrush.  Plan:  I have written the patient a prescription for 2 mg dexamethasone to be taken two times daily. I have also written the patient a prescription for diflucan today.  The patient has been scheduled for repeat brain MRI the end of January with a follow up to discuss these results.  _____________________________________  Sheral Apley Tammi Klippel, M.D.   This document serves as a record of services personally performed by Tyler Pita, MD. It was created  on his behalf by Arlyce Harman, a trained medical scribe. The creation of this record is based on the scribe's personal observations and the provider's statements to them. This document has been checked and approved by the attending provider.

## 2015-09-14 ENCOUNTER — Encounter: Payer: Self-pay | Admitting: Hematology and Oncology

## 2015-09-14 ENCOUNTER — Ambulatory Visit (HOSPITAL_BASED_OUTPATIENT_CLINIC_OR_DEPARTMENT_OTHER): Payer: Medicare Other | Admitting: Hematology and Oncology

## 2015-09-14 ENCOUNTER — Other Ambulatory Visit (HOSPITAL_BASED_OUTPATIENT_CLINIC_OR_DEPARTMENT_OTHER): Payer: Medicare Other

## 2015-09-14 VITALS — BP 123/68 | HR 85 | Temp 98.1°F | Resp 18

## 2015-09-14 DIAGNOSIS — C711 Malignant neoplasm of frontal lobe: Secondary | ICD-10-CM

## 2015-09-14 DIAGNOSIS — C710 Malignant neoplasm of cerebrum, except lobes and ventricles: Secondary | ICD-10-CM

## 2015-09-14 LAB — CBC WITH DIFFERENTIAL/PLATELET
BASO%: 0.4 % (ref 0.0–2.0)
Basophils Absolute: 0.1 10*3/uL (ref 0.0–0.1)
EOS ABS: 0 10*3/uL (ref 0.0–0.5)
EOS%: 0.1 % (ref 0.0–7.0)
HEMATOCRIT: 40.4 % (ref 34.8–46.6)
HEMOGLOBIN: 13.9 g/dL (ref 11.6–15.9)
LYMPH#: 0.9 10*3/uL (ref 0.9–3.3)
LYMPH%: 5.2 % — ABNORMAL LOW (ref 14.0–49.7)
MCH: 33.2 pg (ref 25.1–34.0)
MCHC: 34.5 g/dL (ref 31.5–36.0)
MCV: 96.2 fL (ref 79.5–101.0)
MONO#: 0.9 10*3/uL (ref 0.1–0.9)
MONO%: 5.6 % (ref 0.0–14.0)
NEUT%: 88.7 % — ABNORMAL HIGH (ref 38.4–76.8)
NEUTROS ABS: 15 10*3/uL — AB (ref 1.5–6.5)
Platelets: 243 10*3/uL (ref 145–400)
RBC: 4.2 10*6/uL (ref 3.70–5.45)
RDW: 16.3 % — AB (ref 11.2–14.5)
WBC: 16.8 10*3/uL — AB (ref 3.9–10.3)

## 2015-09-14 LAB — COMPREHENSIVE METABOLIC PANEL
ALBUMIN: 3.8 g/dL (ref 3.5–5.0)
ALK PHOS: 56 U/L (ref 40–150)
ALT: 31 U/L (ref 0–55)
AST: 17 U/L (ref 5–34)
Anion Gap: 11 mEq/L (ref 3–11)
BILIRUBIN TOTAL: 2.6 mg/dL — AB (ref 0.20–1.20)
BUN: 35.4 mg/dL — AB (ref 7.0–26.0)
CO2: 30 mEq/L — ABNORMAL HIGH (ref 22–29)
CREATININE: 1.3 mg/dL — AB (ref 0.6–1.1)
Calcium: 9 mg/dL (ref 8.4–10.4)
Chloride: 93 mEq/L — ABNORMAL LOW (ref 98–109)
EGFR: 41 mL/min/{1.73_m2} — ABNORMAL LOW (ref >90)
GLUCOSE: 171 mg/dL — AB (ref 70–140)
Potassium: 2.9 mEq/L — CL (ref 3.5–5.1)
SODIUM: 133 meq/L — AB (ref 136–145)
TOTAL PROTEIN: 6.8 g/dL (ref 6.4–8.3)

## 2015-09-14 MED ORDER — TEMOZOLOMIDE 180 MG PO CAPS: 360.0000 mg | ORAL_CAPSULE | Freq: Every day | ORAL | Status: DC

## 2015-09-14 NOTE — Progress Notes (Signed)
Prescription for Temodar called in to [facility].

## 2015-09-14 NOTE — Progress Notes (Signed)
Patient Care Team: Shirline Frees, MD as PCP - General (Family Medicine)  DIAGNOSIS: No matching staging information was found for the patient.  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 Her dexamethasone was tapered and she started developing neurological symptoms and her family increase the dexamethasone and her symptoms got markedly better. Dr. Tammi Klippel has subsequently given her regimen to follow. Her right upper and lower extremity strength have now recovered back to normal.  REVIEW OF SYSTEMS:   Constitutional: Denies fevers, chills or abnormal weight loss Eyes: Denies blurriness of vision Ears, nose, mouth, throat, and face: Denies mucositis or sore throat Respiratory: Denies cough, dyspnea or wheezes Cardiovascular: Denies palpitation, chest discomfort Gastrointestinal:  Denies nausea, heartburn or change in bowel habits Skin: Denies abnormal skin rashes Lymphatics: Denies new lymphadenopathy or easy bruising Neurological:Denies numbness, tingling or new weaknesses Behavioral/Psych: Mood is stable, no  new changes  Extremities: No lower extremity edema All other systems were reviewed with the patient and are negative.  I have reviewed the past medical history, past surgical history, social history and family history with the patient and they are unchanged from previous note.  ALLERGIES:  is allergic to bactrim; sulfa antibiotics; vancomycin; atorvastatin; celebrex; ibuprofen; latex; other; pioglitazone; and neosporin.  MEDICATIONS:  Current Outpatient Prescriptions  Medication Sig Dispense Refill  . ACCU-CHEK AVIVA PLUS test strip USE AS DIRECTED 4 TIMES A DAY  11  . acetaminophen (TYLENOL) 80 MG/0.8ML suspension Take 10 mg/kg by mouth every 4 (four) hours as needed for fever.    Marland Kitchen b complex vitamins tablet Take 1 tablet by mouth daily.    Marland Kitchen bismuth subsalicylate (PEPTO BISMOL) 262 MG/15ML suspension Take 7.5 mLs by mouth every 6 (six) hours as needed for indigestion or diarrhea or loose stools.    . bumetanide (BUMEX) 2 MG tablet Take 1 mg by mouth daily.   6  . COD LIVER OIL PO Take 1 capsule by mouth daily. Reported on 09/09/2015    . CRANBERRY PO Take 1 tablet by mouth 2 (two) times daily.    . dapsone 100 MG tablet Take 1 tablet (100 mg total) by mouth daily. Take with food to help prevent any nausea/vomiting 30 tablet 6  . dexamethasone (DECADRON) 4 MG tablet Take 2 tablets (8 mg total) by mouth daily. 40 tablet 0  . dexamethasone (DECADRON) 4 MG tablet Take 0.5 tablets (2 mg total) by mouth 2 (two) times daily with a meal. 30 tablet 5  . fluconazole (DIFLUCAN) 100 MG tablet Take 1 tablet (100 mg total) by mouth daily. Take 2 pills on the first day. 8 tablet 0  . glipiZIDE (GLUCOTROL) 10 MG tablet Take 10 mg by  mouth 2 (two) times daily before a meal.     . hydrochlorothiazide 25 MG tablet Take 25 mg by mouth daily.     . hydrOXYzine (ATARAX/VISTARIL) 50 MG tablet Take 50 mg by mouth 3 (three) times daily as needed for itching.     . insulin NPH-insulin regular (NOVOLIN 70/30)  (70-30) 100 UNIT/ML injection Inject 80 Units into the skin 2 (two) times daily with a meal. Sliding scale    . insulin regular (NOVOLIN R,HUMULIN R) 100 units/mL injection Inject 0-20 Units into the skin daily as needed for high blood sugar. Sliding scale    . iron polysaccharides (NIFEREX) 150 MG capsule Take 150 mg by mouth 2 (two) times daily.    Marland Kitchen lactobacillus acidophilus (BACID) TABS tablet Take 1 tablet by mouth daily.    Marland Kitchen latanoprost (XALATAN) 0.005 % ophthalmic solution Place 1 drop into both eyes at bedtime.    . Multiple Vitamins-Minerals (MULTIVITAMIN WITH MINERALS) tablet Take 1 tablet by mouth daily.    Marland Kitchen nystatin cream (MYCOSTATIN) Apply 1 application topically daily as needed for dry skin.    Marland Kitchen OVER THE COUNTER MEDICATION Take 1 tablet by mouth 2 (two) times daily. Chlorellea    . potassium chloride (K-DUR,KLOR-CON) 10 MEQ tablet Take 10 mEq by mouth 2 (two) times daily.    Marland Kitchen temozolomide (TEMODAR) 180 MG capsule Take 1 capsule (180 mg total) by mouth daily. May take on an empty stomach or at bedtime to decrease nausea & vomiting. (Patient not taking: Reported on 09/09/2015) 15 capsule 0  . timolol (TIMOPTIC) 0.5 % ophthalmic solution Place 1 drop into both eyes 2 (two) times daily.     . traMADol (ULTRAM) 50 MG tablet Take 50 mg by mouth every 6 (six) hours as needed for moderate pain.    . vitamin C (ASCORBIC ACID) 500 MG tablet Take 500 mg by mouth 2 (two) times daily.      No current facility-administered medications for this visit.    PHYSICAL EXAMINATION: ECOG PERFORMANCE STATUS: 1 - Symptomatic but completely ambulatory  Filed Vitals:   09/14/15 1354  BP: 123/68  Pulse: 85  Temp: 98.1 F (36.7 C)  Resp: 18   Filed Weights    GENERAL:alert, no distress and comfortable , facial puffiness SKIN: skin color, texture, turgor are normal, no rashes or significant lesions EYES: normal, Conjunctiva are pink and non-injected, sclera clear OROPHARYNX:no exudate, no  erythema and lips, buccal mucosa, and tongue normal  NECK: supple, thyroid normal size, non-tender, without nodularity LYMPH:  no palpable lymphadenopathy in the cervical, axillary or inguinal LUNGS: clear to auscultation and percussion with normal breathing effort HEART: regular rate & rhythm and no murmurs and no lower extremity edema ABDOMEN:abdomen soft, non-tender and normal bowel sounds MUSCULOSKELETAL:no cyanosis of digits and no clubbing  NEURO: alert & oriented x 3 with fluent speech, no focal motor/sensory deficits EXTREMITIES:  Bilateral lower extremity edema   LABORATORY DATA:  I have reviewed the data as listed   Chemistry      Component Value Date/Time   NA 130* 06/25/2015 1250   K 3.0* 06/25/2015 1250   CL 93* 06/25/2015 1250   CO2 28 06/25/2015 1250   BUN 33* 06/25/2015 1250   CREATININE 1.41* 06/25/2015 1250      Component Value Date/Time   CALCIUM 8.4* 06/25/2015 1250   ALKPHOS 112 05/26/2015 1648   AST 23 05/26/2015 1648   ALT 15 05/26/2015 1648   BILITOT 1.5* 05/26/2015 1648  Lab Results  Component Value Date   WBC 16.8* 09/14/2015   HGB 13.9 09/14/2015   HCT 40.4 09/14/2015   MCV 96.2 09/14/2015   PLT 243 09/14/2015   NEUTROABS 15.0* 09/14/2015     ASSESSMENT & PLAN:  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  Treatment plan: Concurrent chemoradiation with Temodar (07/15/2015 to 08/08/2015) followed by maintenance temozolomide Current treatment: Temodar maintenance Cycle 1 (360 mg daily days 1-5 Q monthly)  Neurological changes: sensation steroids were tapered, she developed profound right upper and lower extremity weakness , difficulty with speech and dexamethasone was increased and her symptoms had improved. Dr.  Tammi Klippel is monitoring her dexamethasone dosage.  Temodar toxicities: Denies any nausea vomiting Denies any fatigue Complains of occasional constipation: Uses stool softeners  Return to clinic  After MRI of the brain on 10/06/2015 on the same day as follow-up with Dr. Tammi Klippel  With blood work  No orders of the defined types were placed in this encounter.   The patient has a good understanding of the overall plan. she agrees with it. she will call with any problems that may develop before the next visit here.   Rulon Eisenmenger, MD 09/14/2015

## 2015-09-14 NOTE — Assessment & Plan Note (Signed)
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  Treatment plan: Concurrent chemoradiation with Temodar (07/15/2015 to 08/08/2015) followed by maintenance temozolomide Current treatment: Temodar maintenance Cycle 1  Temodar toxicities: Denies any nausea vomiting Denies any fatigue Complains of occasional constipation: Uses stool softeners  Return to clinic in  In 4 weeks for cycle 2 maintenance Temodar.

## 2015-09-15 ENCOUNTER — Telehealth: Payer: Self-pay | Admitting: *Deleted

## 2015-09-15 ENCOUNTER — Other Ambulatory Visit: Payer: Self-pay

## 2015-09-15 ENCOUNTER — Telehealth: Payer: Self-pay | Admitting: Hematology and Oncology

## 2015-09-15 DIAGNOSIS — C711 Malignant neoplasm of frontal lobe: Secondary | ICD-10-CM

## 2015-09-15 MED ORDER — POTASSIUM CHLORIDE CRYS ER 10 MEQ PO TBCR: 10.0000 meq | EXTENDED_RELEASE_TABLET | ORAL | Status: DC

## 2015-09-15 NOTE — Telephone Encounter (Signed)
Let husband know Rx would be sent in for potassium.  He voiced understanding.

## 2015-09-15 NOTE — Telephone Encounter (Signed)
Spoke with son re lab/VG 1/25 @ 11:15 am. Per 1/3 pof f/u w/VG 1/25 after f/u with Dr. Tammi Klippel. Spoke with desk nurse and appointments scheduled for 11:15 am lab and 11:45 am VG - AM [facility] will be just about over by 11:45 am.

## 2015-09-15 NOTE — Telephone Encounter (Signed)
Voicemail: "Her blood was taken there.  You all want her to get more medicine.  Her PCP won't start it until he receives blood work."  This nurse routed yesterday's lab results to PCP via [employer].

## 2015-09-16 ENCOUNTER — Encounter: Payer: Self-pay | Admitting: Pharmacist

## 2015-09-16 ENCOUNTER — Telehealth: Payer: Self-pay | Admitting: Pharmacist

## 2015-09-16 NOTE — Telephone Encounter (Signed)
09/16/15: patient with high copay for Temodar of > $500. Faxed assistance application to [employer] Patient assitance Program at Fax #: [phone] (Telephone #: [phone])  Thank you,  Montel Clock, PharmD, [facility] [phone]

## 2015-09-16 NOTE — Progress Notes (Signed)
09/16/2015: Ms. Lathen has been approved for the [employer] Patient Assistance Program to receive free Temodar. The Temodar will be shipped to Ms. Kallio's home address on Friday 09/17/15. The pharmacy working with the [employer] is [facility].   Thank you,  Montel Clock, PharmD, [facility] [phone]

## 2015-09-20 ENCOUNTER — Telehealth: Payer: Self-pay | Admitting: Hematology and Oncology

## 2015-09-20 ENCOUNTER — Encounter (HOSPITAL_BASED_OUTPATIENT_CLINIC_OR_DEPARTMENT_OTHER): Payer: Medicare Other | Attending: Internal Medicine

## 2015-09-20 NOTE — Telephone Encounter (Signed)
Per susan with rad/onc please move appt to the pm as dr Tammi Klippel has been moved.  She will call the patient

## 2015-09-22 ENCOUNTER — Telehealth: Payer: Self-pay

## 2015-09-22 ENCOUNTER — Telehealth: Payer: Self-pay | Admitting: Radiation Oncology

## 2015-09-22 NOTE — Telephone Encounter (Signed)
Returned call to pt husband re: side effects from temodar.  Pt had some bright red bleeding from her rectum Sun, Mon and Tues, no bowel movements from Friday until Tuesday.  Not associated with a bowel movement.  They used a medication they have for hemorrhoids, which patient has had and used in the past.  No further bleeding.  Pt had bowel movement on Tuesday that was loose and normal color.  Husband was concerned it may be a side effect of the Temodar and wanted to ensure we were aware of bleeding.  Advised husband that the bleeding was more than likely from the hemorrhoids especially as it has resolved.  Educated husband on signs and symptoms of GI bleed.  He voiced understanding.

## 2015-09-22 NOTE — Telephone Encounter (Signed)
Returned daughter's call. Daughter explains her mother is under hospice care. She denies any decline in her mother's status. Reports that she is of the understanding her mother's upcoming MRI may not be covered by insurance because she is under hospice care. Informed Mont Dutton, RT of these findings. Almyra Free, daughter, understands Manuela Schwartz will be in touch.

## 2015-09-24 ENCOUNTER — Encounter: Payer: Self-pay | Admitting: Pharmacist

## 2015-09-24 NOTE — Progress Notes (Signed)
09/24/15: Spoke with patient's daughter, Almyra Free, regarding Temodar. Pt is doing well. No issues. Some rectal bleeding from hemorrhoids but this has resolved. No side effects from the Temodar. Started 5 day course on 09/17/15. Temodar is received through [employer] Patient assistance for $0. Next cycle will start on 10/15/15. Ms. Soja has received a wheelchair has she has issues with walking/balance resulting in some falls recently. Almyra Free is still working on getting a ramp to help in their home.   Thank you,  Montel Clock, PharmD, [facility]

## 2015-10-05 ENCOUNTER — Ambulatory Visit (HOSPITAL_COMMUNITY)
Admission: RE | Admit: 2015-10-05 | Discharge: 2015-10-05 | Disposition: A | Discharge status: Home / Self Care | Payer: Medicare Other | Source: Ambulatory Visit | Attending: Radiation Oncology | Admitting: Radiation Oncology

## 2015-10-05 DIAGNOSIS — G319 Degenerative disease of nervous system, unspecified: Secondary | ICD-10-CM | POA: Insufficient documentation

## 2015-10-05 DIAGNOSIS — H0520 Unspecified exophthalmos: Secondary | ICD-10-CM | POA: Insufficient documentation

## 2015-10-05 DIAGNOSIS — D496 Neoplasm of unspecified behavior of brain: Secondary | ICD-10-CM

## 2015-10-05 DIAGNOSIS — C719 Malignant neoplasm of brain, unspecified: Secondary | ICD-10-CM | POA: Insufficient documentation

## 2015-10-05 DIAGNOSIS — Z9221 Personal history of antineoplastic chemotherapy: Secondary | ICD-10-CM | POA: Insufficient documentation

## 2015-10-05 DIAGNOSIS — I4891 Unspecified atrial fibrillation: Secondary | ICD-10-CM | POA: Insufficient documentation

## 2015-10-05 DIAGNOSIS — E119 Type 2 diabetes mellitus without complications: Secondary | ICD-10-CM

## 2015-10-05 DIAGNOSIS — Z923 Personal history of irradiation: Secondary | ICD-10-CM

## 2015-10-05 DIAGNOSIS — R531 Weakness: Secondary | ICD-10-CM | POA: Diagnosis not present

## 2015-10-05 DIAGNOSIS — N179 Acute kidney failure, unspecified: Secondary | ICD-10-CM | POA: Diagnosis not present

## 2015-10-05 LAB — CBC WITH DIFFERENTIAL/PLATELET
BASOS PCT: 0 %
Basophils Absolute: 0 10*3/uL (ref 0.0–0.1)
EOS ABS: 0 10*3/uL (ref 0.0–0.7)
EOS PCT: 0 %
HCT: 39.8 % (ref 36.0–46.0)
HEMOGLOBIN: 14.5 g/dL (ref 12.0–15.0)
LYMPHS ABS: 0.8 10*3/uL (ref 0.7–4.0)
Lymphocytes Relative: 6 %
MCH: 33.9 pg (ref 26.0–34.0)
MCHC: 36.4 g/dL — AB (ref 30.0–36.0)
MCV: 93 fL (ref 78.0–100.0)
MONO ABS: 1.1 10*3/uL — AB (ref 0.1–1.0)
MONOS PCT: 8 %
Neutro Abs: 13 10*3/uL — ABNORMAL HIGH (ref 1.7–7.7)
Neutrophils Relative %: 86 %
PLATELETS: 139 10*3/uL — AB (ref 150–400)
RBC: 4.28 MIL/uL (ref 3.87–5.11)
RDW: 17 % — AB (ref 11.5–15.5)
WBC: 15 10*3/uL — ABNORMAL HIGH (ref 4.0–10.5)

## 2015-10-05 LAB — POCT I-STAT CREATININE: Creatinine, Ser: 1.8 mg/dL — ABNORMAL HIGH (ref 0.44–1.00)

## 2015-10-05 LAB — COMPREHENSIVE METABOLIC PANEL
ALT: 51 U/L (ref 14–54)
AST: 36 U/L (ref 15–41)
Albumin: 3.9 g/dL (ref 3.5–5.0)
Alkaline Phosphatase: 80 U/L (ref 38–126)
Anion gap: 16 — ABNORMAL HIGH (ref 5–15)
BILIRUBIN TOTAL: 6.1 mg/dL — AB (ref 0.3–1.2)
BUN: 50 mg/dL — AB (ref 6–20)
CALCIUM: 9.4 mg/dL (ref 8.9–10.3)
CO2: 26 mmol/L (ref 22–32)
CREATININE: 1.77 mg/dL — AB (ref 0.44–1.00)
Chloride: 92 mmol/L — ABNORMAL LOW (ref 101–111)
GFR calc Af Amer: 32 mL/min — ABNORMAL LOW (ref >60)
GFR, EST NON AFRICAN AMERICAN: 28 mL/min — AB (ref >60)
GLUCOSE: 334 mg/dL — AB (ref 65–99)
POTASSIUM: 2.8 mmol/L — AB (ref 3.5–5.1)
Sodium: 134 mmol/L — ABNORMAL LOW (ref 135–145)
Total Protein: 6.9 g/dL (ref 6.5–8.1)

## 2015-10-05 MED ORDER — GADOBENATE DIMEGLUMINE 529 MG/ML IV SOLN
10.0000 mL | Freq: Once | INTRAVENOUS | Status: AC | PRN
  Administered 2015-10-05: 10 mL via INTRAVENOUS

## 2015-10-06 ENCOUNTER — Ambulatory Visit: Payer: Medicare Other | Admitting: Hematology and Oncology

## 2015-10-06 ENCOUNTER — Other Ambulatory Visit: Payer: Medicare Other

## 2015-10-06 ENCOUNTER — Ambulatory Visit: Admission: RE | Admit: 2015-10-06 | Payer: Medicare Other | Source: Ambulatory Visit | Admitting: Radiation Oncology

## 2015-10-07 ENCOUNTER — Encounter (HOSPITAL_COMMUNITY): Payer: Self-pay

## 2015-10-07 ENCOUNTER — Telehealth: Payer: Self-pay

## 2015-10-07 ENCOUNTER — Inpatient Hospital Stay (HOSPITAL_COMMUNITY)
Admission: EM | Admit: 2015-10-07 | Discharge: 2015-10-10 | DRG: 683 | Disposition: A | Discharge status: Hospice | Payer: Medicare Other | Attending: Internal Medicine | Admitting: Internal Medicine

## 2015-10-07 ENCOUNTER — Emergency Department (HOSPITAL_COMMUNITY): Payer: Medicare Other

## 2015-10-07 DIAGNOSIS — Z794 Long term (current) use of insulin: Secondary | ICD-10-CM | POA: Diagnosis not present

## 2015-10-07 DIAGNOSIS — C719 Malignant neoplasm of brain, unspecified: Secondary | ICD-10-CM | POA: Diagnosis present

## 2015-10-07 DIAGNOSIS — Z923 Personal history of irradiation: Secondary | ICD-10-CM

## 2015-10-07 DIAGNOSIS — E669 Obesity, unspecified: Secondary | ICD-10-CM | POA: Diagnosis present

## 2015-10-07 DIAGNOSIS — E1121 Type 2 diabetes mellitus with diabetic nephropathy: Secondary | ICD-10-CM | POA: Diagnosis present

## 2015-10-07 DIAGNOSIS — N179 Acute kidney failure, unspecified: Secondary | ICD-10-CM | POA: Diagnosis not present

## 2015-10-07 DIAGNOSIS — Z833 Family history of diabetes mellitus: Secondary | ICD-10-CM | POA: Diagnosis not present

## 2015-10-07 DIAGNOSIS — Z66 Do not resuscitate: Secondary | ICD-10-CM | POA: Diagnosis present

## 2015-10-07 DIAGNOSIS — T380X5A Adverse effect of glucocorticoids and synthetic analogues, initial encounter: Secondary | ICD-10-CM | POA: Diagnosis present

## 2015-10-07 DIAGNOSIS — K8020 Calculus of gallbladder without cholecystitis without obstruction: Secondary | ICD-10-CM | POA: Diagnosis present

## 2015-10-07 DIAGNOSIS — N183 Chronic kidney disease, stage 3 unspecified: Secondary | ICD-10-CM | POA: Diagnosis present

## 2015-10-07 DIAGNOSIS — T451X5A Adverse effect of antineoplastic and immunosuppressive drugs, initial encounter: Secondary | ICD-10-CM | POA: Diagnosis present

## 2015-10-07 DIAGNOSIS — Z8249 Family history of ischemic heart disease and other diseases of the circulatory system: Secondary | ICD-10-CM | POA: Diagnosis not present

## 2015-10-07 DIAGNOSIS — E1122 Type 2 diabetes mellitus with diabetic chronic kidney disease: Secondary | ICD-10-CM | POA: Diagnosis present

## 2015-10-07 DIAGNOSIS — R5383 Other fatigue: Secondary | ICD-10-CM | POA: Diagnosis not present

## 2015-10-07 DIAGNOSIS — R627 Adult failure to thrive: Secondary | ICD-10-CM | POA: Diagnosis present

## 2015-10-07 DIAGNOSIS — R040 Epistaxis: Secondary | ICD-10-CM | POA: Diagnosis present

## 2015-10-07 DIAGNOSIS — Z7401 Bed confinement status: Secondary | ICD-10-CM | POA: Diagnosis not present

## 2015-10-07 DIAGNOSIS — H409 Unspecified glaucoma: Secondary | ICD-10-CM | POA: Diagnosis present

## 2015-10-07 DIAGNOSIS — C711 Malignant neoplasm of frontal lobe: Secondary | ICD-10-CM | POA: Diagnosis present

## 2015-10-07 DIAGNOSIS — Z8542 Personal history of malignant neoplasm of other parts of uterus: Secondary | ICD-10-CM | POA: Diagnosis not present

## 2015-10-07 DIAGNOSIS — R1900 Intra-abdominal and pelvic swelling, mass and lump, unspecified site: Secondary | ICD-10-CM

## 2015-10-07 DIAGNOSIS — Z515 Encounter for palliative care: Secondary | ICD-10-CM | POA: Diagnosis present

## 2015-10-07 DIAGNOSIS — D6959 Other secondary thrombocytopenia: Secondary | ICD-10-CM | POA: Diagnosis present

## 2015-10-07 DIAGNOSIS — D72829 Elevated white blood cell count, unspecified: Secondary | ICD-10-CM | POA: Diagnosis present

## 2015-10-07 DIAGNOSIS — Z6841 Body Mass Index (BMI) 40.0 and over, adult: Secondary | ICD-10-CM

## 2015-10-07 DIAGNOSIS — R1011 Right upper quadrant pain: Secondary | ICD-10-CM

## 2015-10-07 DIAGNOSIS — N170 Acute kidney failure with tubular necrosis: Secondary | ICD-10-CM | POA: Diagnosis not present

## 2015-10-07 DIAGNOSIS — E876 Hypokalemia: Secondary | ICD-10-CM | POA: Diagnosis not present

## 2015-10-07 DIAGNOSIS — I4891 Unspecified atrial fibrillation: Secondary | ICD-10-CM | POA: Diagnosis present

## 2015-10-07 DIAGNOSIS — R531 Weakness: Secondary | ICD-10-CM | POA: Diagnosis present

## 2015-10-07 DIAGNOSIS — E860 Dehydration: Secondary | ICD-10-CM | POA: Diagnosis present

## 2015-10-07 LAB — CBC WITH DIFFERENTIAL/PLATELET
Basophils Absolute: 0 10*3/uL (ref 0.0–0.1)
Basophils Relative: 0 %
EOS PCT: 0 %
Eosinophils Absolute: 0 10*3/uL (ref 0.0–0.7)
HEMATOCRIT: 35.8 % — AB (ref 36.0–46.0)
Hemoglobin: 12.9 g/dL (ref 12.0–15.0)
LYMPHS PCT: 9 %
Lymphs Abs: 1.1 10*3/uL (ref 0.7–4.0)
MCH: 33.9 pg (ref 26.0–34.0)
MCHC: 36 g/dL (ref 30.0–36.0)
MCV: 94 fL (ref 78.0–100.0)
MONO ABS: 0.9 10*3/uL (ref 0.1–1.0)
MONOS PCT: 8 %
NEUTROS ABS: 10 10*3/uL — AB (ref 1.7–7.7)
Neutrophils Relative %: 83 %
PLATELETS: 92 10*3/uL — AB (ref 150–400)
RBC: 3.81 MIL/uL — ABNORMAL LOW (ref 3.87–5.11)
RDW: 17.6 % — AB (ref 11.5–15.5)
WBC: 12 10*3/uL — ABNORMAL HIGH (ref 4.0–10.5)

## 2015-10-07 LAB — COMPREHENSIVE METABOLIC PANEL
ALBUMIN: 3.2 g/dL — AB (ref 3.5–5.0)
ALT: 41 U/L (ref 14–54)
AST: 30 U/L (ref 15–41)
Alkaline Phosphatase: 63 U/L (ref 38–126)
Anion gap: 17 — ABNORMAL HIGH (ref 5–15)
BILIRUBIN TOTAL: 6.9 mg/dL — AB (ref 0.3–1.2)
BUN: 47 mg/dL — AB (ref 6–20)
CHLORIDE: 89 mmol/L — AB (ref 101–111)
CO2: 29 mmol/L (ref 22–32)
CREATININE: 1.68 mg/dL — AB (ref 0.44–1.00)
Calcium: 8.8 mg/dL — ABNORMAL LOW (ref 8.9–10.3)
GFR calc Af Amer: 34 mL/min — ABNORMAL LOW (ref >60)
GFR, EST NON AFRICAN AMERICAN: 30 mL/min — AB (ref >60)
GLUCOSE: 240 mg/dL — AB (ref 65–99)
POTASSIUM: 2.8 mmol/L — AB (ref 3.5–5.1)
Sodium: 135 mmol/L (ref 135–145)
Total Protein: 5.6 g/dL — ABNORMAL LOW (ref 6.5–8.1)

## 2015-10-07 LAB — LACTATE DEHYDROGENASE: LDH: 288 U/L — AB (ref 98–192)

## 2015-10-07 LAB — MAGNESIUM: MAGNESIUM: 2.3 mg/dL (ref 1.7–2.4)

## 2015-10-07 LAB — BILIRUBIN, DIRECT: Bilirubin, Direct: 1.1 mg/dL — ABNORMAL HIGH (ref 0.1–0.5)

## 2015-10-07 MED ORDER — SODIUM CHLORIDE 0.9 % IV BOLUS (SEPSIS)
500.0000 mL | Freq: Once | INTRAVENOUS | Status: AC
  Administered 2015-10-07: 500 mL via INTRAVENOUS

## 2015-10-07 MED ORDER — POTASSIUM CHLORIDE 10 MEQ/100ML IV SOLN
10.0000 meq | INTRAVENOUS | Status: AC
  Administered 2015-10-08: 10 meq via INTRAVENOUS
  Filled 2015-10-07: qty 100

## 2015-10-07 MED ORDER — POTASSIUM CHLORIDE 10 MEQ/100ML IV SOLN
10.0000 meq | INTRAVENOUS | Status: AC
  Administered 2015-10-07: 10 meq via INTRAVENOUS
  Filled 2015-10-07: qty 100

## 2015-10-07 MED ORDER — POTASSIUM CHLORIDE CRYS ER 20 MEQ PO TBCR
40.0000 meq | EXTENDED_RELEASE_TABLET | Freq: Once | ORAL | Status: AC
  Administered 2015-10-07: 40 meq via ORAL
  Filled 2015-10-07: qty 2

## 2015-10-07 MED ORDER — SODIUM CHLORIDE 0.9 % IV BOLUS (SEPSIS)
1000.0000 mL | Freq: Once | INTRAVENOUS | Status: AC
  Administered 2015-10-07: 1000 mL via INTRAVENOUS

## 2015-10-07 NOTE — ED Notes (Signed)
Pt arrives EMS from home after contacted by PCP about abnormal labs.Pt denies pain or weaknes. Pt has HX of brain cancer.

## 2015-10-07 NOTE — Discharge Instructions (Signed)
Dehydration, Adult Dehydration is a condition in which you do not have enough fluid or water in your body. It happens when you take in less fluid than you lose. Vital organs such as the kidneys, brain, and heart cannot function without a proper amount of fluids. Any loss of fluids from the body can cause dehydration.  Dehydration can range from mild to severe. This condition should be treated right away to help prevent it from becoming severe. CAUSES  This condition may be caused by:  Vomiting.  Diarrhea.  Excessive sweating, such as when exercising in hot or humid weather.  Not drinking enough fluid during strenuous exercise or during an illness.  Excessive urine output.  Fever.  Certain medicines. RISK FACTORS This condition is more likely to develop in:  People who are taking certain medicines that cause the body to lose excess fluid (diuretics).   People who have a chronic illness, such as diabetes, that may increase urination.  Older adults.   People who live at high altitudes.   People who participate in endurance sports.  SYMPTOMS  Mild Dehydration  Thirst.  Dry lips.  Slightly dry mouth.  Dry, warm skin. Moderate Dehydration  Very dry mouth.   Muscle cramps.   Dark urine and decreased urine production.   Decreased tear production.   Headache.   Light-headedness, especially when you stand up from a sitting position.  Severe Dehydration  Changes in skin.   Cold and clammy skin.   Skin does not spring back quickly when lightly pinched and released.   Changes in body fluids.   Extreme thirst.   No tears.   Not able to sweat when body temperature is high, such as in hot weather.   Minimal urine production.   Changes in vital signs.   Rapid, weak pulse (more than 100 beats per minute when you are sitting still).   Rapid breathing.   Low blood pressure.   Other changes.   Sunken eyes.   Cold hands and feet.    Confusion.  Lethargy and difficulty being awakened.  Fainting (syncope).   Short-term weight loss.   Unconsciousness. DIAGNOSIS  This condition may be diagnosed based on your symptoms. You may also have tests to determine how severe your dehydration is. These tests may include:   Urine tests.   Blood tests.  TREATMENT  Treatment for this condition depends on the severity. Mild or moderate dehydration can often be treated at home. Treatment should be started right away. Do not wait until dehydration becomes severe. Severe dehydration needs to be treated at the hospital. Treatment for Mild Dehydration  Drinking plenty of water to replace the fluid you have lost.   Replacing minerals in your blood (electrolytes) that you may have lost.  Treatment for Moderate Dehydration  Consuming oral rehydration solution (ORS). Treatment for Severe Dehydration  Receiving fluid through an IV tube.   Receiving electrolyte solution through a feeding tube that is passed through your nose and into your stomach (nasogastric tube or NG tube).  Correcting any abnormalities in electrolytes. HOME CARE INSTRUCTIONS   Drink enough fluid to keep your urine clear or pale yellow.   Drink water or fluid slowly by taking small sips. You can also try sucking on ice cubes.  Have food or beverages that contain electrolytes. Examples include bananas and sports drinks.  Take over-the-counter and prescription medicines only as told by your health care provider.   Prepare ORS according to the manufacturer's instructions. Take sips   of ORS every 5 minutes until your urine returns to normal.  If you have vomiting or diarrhea, continue to try to drink water, ORS, or both.   If you have diarrhea, avoid:   Beverages that contain caffeine.   Fruit juice.   Milk.   Carbonated soft drinks.  Do not take salt tablets. This can lead to the condition of having too much sodium in your body  (hypernatremia).  SEEK MEDICAL CARE IF:  You cannot eat or drink without vomiting.  You have had moderate diarrhea during a period of more than 24 hours.  You have a fever. SEEK IMMEDIATE MEDICAL CARE IF:   You have extreme thirst.  You have severe diarrhea.  You have not urinated in 6-8 hours, or you have urinated only a small amount of very dark urine.  You have shriveled skin.  You are dizzy, confused, or both.   This information is not intended to replace advice given to you by your health care provider. Make sure you discuss any questions you have with your health care provider.   Document Released: 08/28/2005 Document Revised: 05/19/2015 Document Reviewed: 01/13/2015 Elsevier Interactive Patient Education 2016 Elsevier Inc.   Hypokalemia Hypokalemia means that the amount of potassium in the blood is lower than normal.Potassium is a chemical, called an electrolyte, that helps regulate the amount of fluid in the body. It also stimulates muscle contraction and helps nerves function properly.Most of the body's potassium is inside of cells, and only a very small amount is in the blood. Because the amount in the blood is so small, minor changes can be life-threatening. CAUSES  Antibiotics.  Diarrhea or vomiting.  Using laxatives too much, which can cause diarrhea.  Chronic kidney disease.  Water pills (diuretics).  Eating disorders (bulimia).  Low magnesium level.  Sweating a lot. SIGNS AND SYMPTOMS  Weakness.  Constipation.  Fatigue.  Muscle cramps.  Mental confusion.  Skipped heartbeats or irregular heartbeat (palpitations).  Tingling or numbness. DIAGNOSIS  Your health care provider can diagnose hypokalemia with blood tests. In addition to checking your potassium level, your health care provider may also check other lab tests. TREATMENT Hypokalemia can be treated with potassium supplements taken by mouth or adjustments in your current medicines.  If your potassium level is very low, you may need to get potassium through a vein (IV) and be monitored in the hospital. A diet high in potassium is also helpful. Foods high in potassium are:  Nuts, such as peanuts and pistachios.  Seeds, such as sunflower seeds and pumpkin seeds.  Peas, lentils, and lima beans.  Whole grain and bran cereals and breads.  Fresh fruit and vegetables, such as apricots, avocado, bananas, cantaloupe, kiwi, oranges, tomatoes, asparagus, and potatoes.  Orange and tomato juices.  Red meats.  Fruit yogurt. HOME CARE INSTRUCTIONS  Take all medicines as prescribed by your health care provider.  Maintain a healthy diet by including nutritious food, such as fruits, vegetables, nuts, whole grains, and lean meats.  If you are taking a laxative, be sure to follow the directions on the label. SEEK MEDICAL CARE IF:  Your weakness gets worse.  You feel your heart pounding or racing.  You are vomiting or having diarrhea.  You are diabetic and having trouble keeping your blood glucose in the normal range. SEEK IMMEDIATE MEDICAL CARE IF:  You have chest pain, shortness of breath, or dizziness.  You are vomiting or having diarrhea for more than 2 days.  You faint. MAKE   SURE YOU:   Understand these instructions.  Will watch your condition.  Will get help right away if you are not doing well or get worse.   This information is not intended to replace advice given to you by your health care provider. Make sure you discuss any questions you have with your health care provider.   Document Released: 08/28/2005 Document Revised: 09/18/2014 Document Reviewed: 02/28/2013 Elsevier Interactive Patient Education 2016 Elsevier Inc.  

## 2015-10-07 NOTE — Telephone Encounter (Signed)
Spoke with daughter Almyra Free.  She is in [place] but she will call her father and brother who are at home with patient and let them know patient needs to come in to ED and that ambulance will be coming to get her.  Spoke with [facility] ED to have patient brought in to ED.  Spoke with Elane Fritz - ED Charge RN and let her know pt would be coming in.

## 2015-10-07 NOTE — Discharge Planning (Signed)
Pt from home with Hospice ([facility]).  Please contact office [phone] when pt ready for d/c home.  Pt lives with son Karma Greaser).

## 2015-10-07 NOTE — ED Notes (Signed)
Pt to US.

## 2015-10-07 NOTE — ED Notes (Signed)
Pt placed on bedpan. Pt screams with movement but cannot identify specific pain. States everything hurts.

## 2015-10-07 NOTE — ED Provider Notes (Signed)
CSN: [id number]     Arrival date & time 10/07/15  1148 History   First MD Initiated Contact with Patient 10/07/15 1148     Chief Complaint  Patient presents with  . Abnormal Lab     (Consider location/radiation/quality/duration/timing/severity/associated sxs/prior Treatment) HPI Patient with glioblastoma status post radiation. She presents to the emergency department after being called by her primary physician for abnormal lab result. Patient does not know which results were abnormal. She complains of chronic fatigue but this is unchanged. No new pain, vomiting or diarrhea. No fever. No new focal weakness or numbness. Past Medical History  Diagnosis Date  . Venous insufficiency   . Venous stasis ulcer ([place])   . 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 Neuropathy ([place])   . DJD (degenerative joint disease)   . Anemia   . 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 Atrial fibrillation ([place])   . Depression   . Seizures ([place])     only 1 due to dehydration   . Cancer [facility])     uterine cancer  . Constipation   . Glaucoma   . Glioblastoma [facility])    Past Surgical History  Procedure Laterality Date  . Tonsilectomy, adenoidectomy, bilateral myringotomy and tubes    . Mouth surgery    . I&d extremity Left 01/14/2014    Procedure: IRRIGATION AND DEBRIDEMENT LEFT LOWER EXTREMITY POSSIBLE ACELL AND POSSIBLE VAC PLACEMENT;  Surgeon: Theodoro Kos, DO;  Location: [facility];  Service: Plastics;  Laterality: Left;  . Cervical ablation    . Brain biopsy Right 06/25/2015    Procedure: Right parietal stereotactic brain biopsy;  Surgeon: Consuella Lose, MD;  Location: [place] [facility];  Service: Neurosurgery;  Laterality: Right;  Right parietal stereotactic brain biopsy   Family History  Problem Relation Age of Onset  . Heart disease Mother   . Heart disease Father   . Diabetes Maternal Grandmother    Social History  Substance Use Topics  . Smoking status: Never Smoker    . Smokeless tobacco: Never Used  . Alcohol Use: No   OB History    No data available     Review of Systems  Constitutional: Positive for fatigue. Negative for fever and chills.  Respiratory: Negative for shortness of breath.   Cardiovascular: Negative for chest pain.  Gastrointestinal: Negative for nausea, vomiting, abdominal pain and diarrhea.  Musculoskeletal: Negative for back pain.  Skin: Negative for rash and wound.  Neurological: Negative for dizziness, weakness, light-headedness, numbness and headaches.  All other systems reviewed and are negative.     Allergies  Bactrim; Sulfa antibiotics; Vancomycin; Atorvastatin; Celebrex; Ibuprofen; Latex; Other; Pioglitazone; and Neosporin  Home Medications   Prior to Admission medications   Medication Sig Start Date End Date Taking? Authorizing Provider  b complex vitamins tablet Take 1 tablet by mouth daily.   Yes Historical Provider, MD  bismuth subsalicylate (PEPTO BISMOL) 262 MG/15ML suspension Take 7.5 mLs by mouth every 6 (six) hours as needed for indigestion or diarrhea or loose stools.   Yes Historical Provider, MD  bumetanide (BUMEX) 2 MG tablet Take 1 mg by mouth daily.  02/22/15  Yes Historical Provider, MD  COD LIVER OIL PO Take 1 capsule by mouth daily. Reported on 09/09/2015   Yes Historical Provider, MD  CRANBERRY PO Take 1 tablet by mouth 2 (two) times daily.   Yes Historical Provider, MD  dapsone 100 MG tablet Take 1 tablet (100 mg total) by mouth  daily. Take with food to help prevent any nausea/vomiting 07/06/15  Yes Nicholas Lose, MD  dexamethasone (DECADRON) 4 MG tablet Take 0.5 tablets (2 mg total) by mouth 2 (two) times daily with a meal. 09/09/15  Yes Tyler Pita, MD  glipiZIDE (GLUCOTROL) 10 MG tablet Take 10 mg by mouth 2 (two) times daily before a meal.    Yes Historical Provider, MD  hydrochlorothiazide 25 MG tablet Take 25 mg by mouth daily.    Yes Historical Provider, MD  hydrOXYzine (ATARAX/VISTARIL)  50 MG tablet Take 50 mg by mouth 3 (three) times daily as needed for itching.    Yes Historical Provider, MD  insulin NPH-insulin regular (NOVOLIN 70/30) (70-30) 100 UNIT/ML injection Inject 80 Units into the skin 2 (two) times daily with a meal. Sliding scale   Yes Historical Provider, MD  iron polysaccharides (NIFEREX) 150 MG capsule Take 150 mg by mouth 2 (two) times daily.   Yes Historical Provider, MD  lactobacillus acidophilus (BACID) TABS tablet Take 1 tablet by mouth daily.   Yes Historical Provider, MD  latanoprost (XALATAN) 0.005 % ophthalmic solution Place 1 drop into both eyes at bedtime. 04/27/15  Yes Historical Provider, MD  Multiple Vitamins-Minerals (MULTIVITAMIN WITH MINERALS) tablet Take 1 tablet by mouth daily.   Yes Historical Provider, MD  OVER THE COUNTER MEDICATION Take 1 tablet by mouth 2 (two) times daily. [place]   Yes Historical Provider, MD  potassium chloride (K-DUR,KLOR-CON) 10 MEQ tablet Take 1 tablet (10 mEq total) by mouth as directed. Take 2 tablets (20 meq) in the morning and 1 tablet (10 meq) in the evening. 09/15/15  Yes Nicholas Lose, MD  temozolomide (TEMODAR) 180 MG capsule Take 2 capsules (360 mg total) by mouth daily. Take 2 capsules daily X 5 days each month 09/14/15  Yes Nicholas Lose, MD  timolol (TIMOPTIC) 0.5 % ophthalmic solution Place 1 drop into both eyes 2 (two) times daily.  10/16/13  Yes Historical Provider, MD  vitamin C (ASCORBIC ACID) 500 MG tablet Take 500 mg by mouth 2 (two) times daily.    Yes Historical Provider, MD  ACCU-CHEK AVIVA PLUS test strip USE AS DIRECTED 4 TIMES A DAY 06/14/15   Historical Provider, MD  acetaminophen (TYLENOL) 80 MG/0.8ML suspension Take 10 mg/kg by mouth every 4 (four) hours as needed for fever.    Historical Provider, MD  dexamethasone (DECADRON) 4 MG tablet Take 2 tablets (8 mg total) by mouth daily. Patient not taking: Reported on 10/07/2015 07/15/15   Nicholas Lose, MD  fluconazole (DIFLUCAN) 100 MG tablet Take 1 tablet  (100 mg total) by mouth daily. Take 2 pills on the first day. Patient not taking: Reported on 10/07/2015 09/09/15   Tyler Pita, MD  insulin regular (NOVOLIN R,HUMULIN R) 100 units/mL injection Inject 0-20 Units into the skin daily as needed for high blood sugar. Sliding scale    Historical Provider, MD  nystatin cream (MYCOSTATIN) Apply 1 application topically daily as needed for dry skin.    Historical Provider, MD  traMADol (ULTRAM) 50 MG tablet Take 50 mg by mouth every 6 (six) hours as needed for moderate pain.    Historical Provider, MD   BP 105/51 mmHg  Pulse 91  Temp(Src) 97.8 F (36.6 C) (Oral)  Resp 24  Ht 5\' 4"  (1.626 m)  Wt 260 lb (117.935 kg)  BMI 44.61 kg/m2  SpO2 100% Physical Exam  Constitutional: She is oriented to person, place, and time. She appears well-developed and well-nourished. No distress.  Appears chronically ill. Stigmata of chronic steroid use.  HENT:  Head: Normocephalic and atraumatic.  Dry mucous membranes  Eyes: EOM are normal. Pupils are equal, round, and reactive to light.  Neck: Normal range of motion. Neck supple.  Cardiovascular: Normal rate and regular rhythm.  Exam reveals no gallop and no friction rub.   No murmur heard. Pulmonary/Chest: Effort normal and breath sounds normal. No respiratory distress. She has no wheezes. She has no rales.  Abdominal: Soft. Bowel sounds are normal. She exhibits no distension and no mass. There is no tenderness. There is no rebound and no guarding.  Musculoskeletal: Normal range of motion. She exhibits no edema or tenderness.  Neurological: She is alert and oriented to person, place, and time.  Moving all extremities without focal deficit.  Skin: Skin is warm and dry. Rash noted. No erythema.  Petechial rash throughout  Psychiatric: She has a normal mood and affect. Her behavior is normal.  Nursing note and vitals reviewed.   ED Course  Procedures (including critical care time) Labs Review Labs Reviewed   COMPREHENSIVE METABOLIC PANEL - Abnormal; Notable for the following:    Potassium 2.8 (*)    Chloride 89 (*)    Glucose, Bld 240 (*)    BUN 47 (*)    Creatinine, Ser 1.68 (*)    Calcium 8.8 (*)    Total Protein 5.6 (*)    Albumin 3.2 (*)    Total Bilirubin 6.9 (*)    GFR calc non Af Amer 30 (*)    GFR calc Af Amer 34 (*)    Anion gap 17 (*)    All other components within normal limits  MAGNESIUM  CBC WITH DIFFERENTIAL/PLATELET  URINALYSIS, ROUTINE W REFLEX MICROSCOPIC (NOT AT [facility])    Imaging Review Mr [name] Wo Contrast  10/05/2015  CLINICAL DATA:  70 year old female with glioblastoma. Completion of chemoradiation. With a taper of steroids patient did develop neurological symptoms. Diabetes. Atrial fibrillation. Subsequent encounter. EXAM: MRI HEAD WITHOUT AND WITH CONTRAST TECHNIQUE: Multiplanar, multiecho pulse sequences of the brain and surrounding structures were obtained without and with intravenous contrast. CONTRAST:  1mL MULTIHANCE GADOBENATE DIMEGLUMINE 529 MG/ML IV SOLN COMPARISON:  06/24/2015 brain MR. FINDINGS: There has been a change in the appearance of the multi focal glioblastoma. Component of tumor which extends across the splenium of the corpus callosum has decreased in overall size and enhancement. For instance, central aspect the splenium of corpus callosum now with AP dimension of 1.1 cm versus prior 1.9 cm. Tumor tumor in the posterior superior left temporal lobe has decreased in size now measuring 0.8 x 0.9 x 0.7 cm versus prior 1 x 1 x 0.9 cm. Tumor within the posterior left frontal lobe now with less enhancement and mall central necrotic component currently spanning over 3.9 x 2.5 x 1.9 cm previously measuring 4 x 3 x 1.8 cm. Satellite lesions within the parietal lobes have decreased in size and enhancement most notable involving the left parietal lobe where enhancing lesion now measures 2 mm versus prior 8 mm. New from the prior examination is a nonenhancing  lobulated cystic appearing structure insinuating between the tumor within the splenium of the corpus callosum and the cavum velum interpositum. This spans over 2.5 x 1.6 x 1.8 cm. It is possible this represents post radiation therapy benign cyst rather than progressive tumor however, this will require MR surveillance. Blood breakdown products are seen within portions of the necrotic multi focal glioblastoma. Diffuse white matter changes  some of which may represent treatment of tumor however, as is typical with glioblastoma, tumor cells may be contain within the white matter findings. Additionally, patient has a history of diabetes and the patchy nature of other white matter changes suggest there is a component of underlying small vessel disease. Major intracranial vascular structures are patent. No acute thrombotic infarct. Global atrophy without hydrocephalus. Mild exophthalmos.  Post lens replacement. Pressure monitoring device may be in place right parietal region. Post radiation therapy changes of the osseous structures. Radiation therapy changes of bone marrow throughout the calvarium skullbase and upper cervical spine. Mild degenerative changes C3-4. Minimal anterior slip C4. Mild bulge C5-6. IMPRESSION: Change in appearance of multifocal glioma which has decreased in degree enhancement and slight decrease in overall size as detailed above. Central components now appear more necrotic. New from the prior examination is a nonenhancing lobulated cystic appearing structure insinuating between the tumor within the splenium of the corpus callosum and the cavum velum interpositum. This spans over 2.5 x 1.6 x 1.8 cm. It is possible this represents post radiation therapy benign cyst rather than progressive tumor however, this will require MR surveillance. Electronically Signed   By: Genia Del M.D.   On: 10/05/2015 18:39   I have personally reviewed and evaluated these images and lab results as part of my medical  decision-making.   EKG Interpretation None      MDM   Final diagnoses:  Dehydration  Hypokalemia   Evidence of probable dehydration and hypokalemia. Will give IV fluids and replace potassium. Anticipate discharge home. Discussed with patient and patient's husband. Agree with plan.   Signed out to oncoming emergency physician pending IV fluids and potassium replacement.   Julianne Rice, MD 10/08/15 [phone]

## 2015-10-07 NOTE — ED Notes (Signed)
MD at bedside. 

## 2015-10-07 NOTE — ED Provider Notes (Signed)
Handoff received from Dr. Lita Mains at [phone] with plan for finishing potassium infusion and return to home hospice.    Results:  BP 114/64 mmHg  Pulse 83  Temp(Src) 97.8 F (36.6 C) (Oral)  Resp 22  Ht 5\' 4"  (1.626 m)  Wt 260 lb (117.935 kg)  BMI 44.61 kg/m2  SpO2 100%  Results for orders placed or performed during the hospital encounter of 10/07/15  Comprehensive metabolic panel  Result Value Ref Range   Sodium 135 135 - 145 mmol/L   Potassium 2.8 (L) 3.5 - 5.1 mmol/L   Chloride 89 (L) 101 - 111 mmol/L   CO2 29 22 - 32 mmol/L   Glucose, Bld 240 (H) 65 - 99 mg/dL   BUN 47 (H) 6 - 20 mg/dL   Creatinine, Ser 1.68 (H) 0.44 - 1.00 mg/dL   Calcium 8.8 (L) 8.9 - 10.3 mg/dL   Total Protein 5.6 (L) 6.5 - 8.1 g/dL   Albumin 3.2 (L) 3.5 - 5.0 g/dL   AST 30 15 - 41 U/L   ALT 41 14 - 54 U/L   Alkaline Phosphatase 63 38 - 126 U/L   Total Bilirubin 6.9 (H) 0.3 - 1.2 mg/dL   GFR calc non Af Amer 30 (L) >60 mL/min   GFR calc Af Amer 34 (L) >60 mL/min   Anion gap 17 (H) 5 - 15  Magnesium  Result Value Ref Range   Magnesium 2.3 1.7 - 2.4 mg/dL  CBC with Differential/Platelet  Result Value Ref Range   WBC 12.0 (H) 4.0 - 10.5 K/uL   RBC 3.81 (L) 3.87 - 5.11 MIL/uL   Hemoglobin 12.9 12.0 - 15.0 g/dL   HCT 35.8 (L) 36.0 - 46.0 %   MCV 94.0 78.0 - 100.0 fL   MCH 33.9 26.0 - 34.0 pg   MCHC 36.0 30.0 - 36.0 g/dL   RDW 17.6 (H) 11.5 - 15.5 %   Platelets 92 (L) 150 - 400 K/uL   Neutrophils Relative % 83 %   Neutro Abs 10.0 (H) 1.7 - 7.7 K/uL   Lymphocytes Relative 9 %   Lymphs Abs 1.1 0.7 - 4.0 K/uL   Monocytes Relative 8 %   Monocytes Absolute 0.9 0.1 - 1.0 K/uL   Eosinophils Relative 0 %   Eosinophils Absolute 0.0 0.0 - 0.7 K/uL   Basophils Relative 0 %   Basophils Absolute 0.0 0.0 - 0.1 K/uL  Bilirubin, direct  Result Value Ref Range   Bilirubin, Direct 1.1 (H) 0.1 - 0.5 mg/dL  Lactate dehydrogenase  Result Value Ref Range   LDH 288 (H) 98 - 192 U/L    US Abdomen Limited  Ruq  10/07/2015  CLINICAL DATA:  RIGHT upper quadrant pain history of gallstones EXAM: US ABDOMEN LIMITED - RIGHT UPPER QUADRANT COMPARISON:  CT 06/24/2015, ultrasound 812 16 FINDINGS: Gallbladder: Again demonstrated multiple dependent gallstones within lumen gallbladder. No pericholecystic fluid. Wall is mildly thickened at 3.5 mm. Negative sonographic Murphy's sign. Common bile duct: Diameter: Normal common bile duct at 2 mm Liver: No focal lesion identified. Within normal limits in parenchymal echogenicity. IMPRESSION: 1. Multiple gallstones again demonstrated within lumen gallbladder. The gallbladder wall is minimally thickened. No pericholecystic fluid or gallbladder distension. Negative sonographic Murphy's sign. No evidence acute cholecystitis. 2. Normal common bile duct. Electronically Signed   By: Suzy Bouchard M.D.   On: 10/07/2015 19:33    Radiology and laboratory examinations were reviewed by me and used in medical decision making if performed.  Diagnoses that have been ruled out:  None  Diagnoses that are still under consideration:  None  Final diagnoses:  Dehydration  Hypokalemia  Indirect hyperbilirubinemia    MDM:  Family inquiring about labs that they were sent over here to have drawn. Patient appears to have worsening hyperbilirubinemia that is unexplained and declining platelets. Her diffuse purpuric rash, ongoing confusion, jaundice appearance is concerning for developing TTP or other microangiopathic hemolytic anemia. No evidence of biliary obstruction or LFT, alkaline phosphatase elevation to suggest primary hepatic etiology. Discussed with Dr Hal Hope after family requesting admission.  LDH is elevated and direct bili is low increasing likelihood of hemolytic etiology. After continued monitoring in the emergency department and no significant change in the patient's clinical status her daughter arrived stating that her oncology team requested that she be admitted.  Patient admitted to hospitalist without any significant clinical decline during her emergency department course.  Leo Grosser, MD 10/08/15 [phone]

## 2015-10-07 NOTE — Telephone Encounter (Signed)
Spoke with Janett Billow, Charge RN [place].  Let her know pt was to have been brought to [facility], which is why she did not rcv report call from [place].  Let her know Dr. Lindi Adie had her brought in to hospital concerned about liver failure and writer wanted to ensure they had reviewed her liver function.  Jessica assured Probation officer that liver fn values had been looked at.  MD notified.

## 2015-10-08 ENCOUNTER — Encounter (HOSPITAL_COMMUNITY): Payer: Self-pay | Admitting: Internal Medicine

## 2015-10-08 ENCOUNTER — Inpatient Hospital Stay (HOSPITAL_COMMUNITY): Payer: Medicare Other

## 2015-10-08 ENCOUNTER — Telehealth: Payer: Self-pay | Admitting: Radiation Oncology

## 2015-10-08 DIAGNOSIS — N179 Acute kidney failure, unspecified: Principal | ICD-10-CM | POA: Diagnosis present

## 2015-10-08 DIAGNOSIS — R627 Adult failure to thrive: Secondary | ICD-10-CM | POA: Diagnosis present

## 2015-10-08 LAB — BASIC METABOLIC PANEL
Anion gap: 11 (ref 5–15)
BUN: 35 mg/dL — AB (ref 6–20)
CHLORIDE: 99 mmol/L — AB (ref 101–111)
CO2: 27 mmol/L (ref 22–32)
CREATININE: 1.2 mg/dL — AB (ref 0.44–1.00)
Calcium: 8.6 mg/dL — ABNORMAL LOW (ref 8.9–10.3)
GFR calc Af Amer: 52 mL/min — ABNORMAL LOW (ref >60)
GFR calc non Af Amer: 45 mL/min — ABNORMAL LOW (ref >60)
GLUCOSE: 137 mg/dL — AB (ref 65–99)
Potassium: 4 mmol/L (ref 3.5–5.1)
Sodium: 137 mmol/L (ref 135–145)

## 2015-10-08 LAB — HEPATIC FUNCTION PANEL
ALT: 38 U/L (ref 14–54)
AST: 40 U/L (ref 15–41)
Albumin: 2.7 g/dL — ABNORMAL LOW (ref 3.5–5.0)
Alkaline Phosphatase: 56 U/L (ref 38–126)
BILIRUBIN DIRECT: 0.9 mg/dL — AB (ref 0.1–0.5)
BILIRUBIN INDIRECT: 4.3 mg/dL — AB (ref 0.3–0.9)
Total Bilirubin: 5.2 mg/dL — ABNORMAL HIGH (ref 0.3–1.2)
Total Protein: 5.1 g/dL — ABNORMAL LOW (ref 6.5–8.1)

## 2015-10-08 LAB — CBC
HCT: 29.5 % — ABNORMAL LOW (ref 36.0–46.0)
Hemoglobin: 10.8 g/dL — ABNORMAL LOW (ref 12.0–15.0)
MCH: 34.3 pg — AB (ref 26.0–34.0)
MCHC: 36.6 g/dL — AB (ref 30.0–36.0)
MCV: 93.7 fL (ref 78.0–100.0)
PLATELETS: 90 10*3/uL — AB (ref 150–400)
RBC: 3.15 MIL/uL — ABNORMAL LOW (ref 3.87–5.11)
RDW: 17.9 % — AB (ref 11.5–15.5)
WBC: 10.7 10*3/uL — ABNORMAL HIGH (ref 4.0–10.5)

## 2015-10-08 LAB — AMMONIA: Ammonia: 39 umol/L — ABNORMAL HIGH (ref 9–35)

## 2015-10-08 LAB — DIC (DISSEMINATED INTRAVASCULAR COAGULATION)PANEL
INR: 1.14 (ref 0.00–1.49)
Platelets: 92 10*3/uL — ABNORMAL LOW (ref 150–400)
Smear Review: NONE SEEN

## 2015-10-08 LAB — DIC (DISSEMINATED INTRAVASCULAR COAGULATION) PANEL
APTT: 25 s (ref 24–37)
D DIMER QUANT: 4.07 ug{FEU}/mL — AB (ref 0.00–0.50)
FIBRINOGEN: 245 mg/dL (ref 204–475)
PROTHROMBIN TIME: 14.8 s (ref 11.6–15.2)

## 2015-10-08 LAB — GLUCOSE, CAPILLARY
GLUCOSE-CAPILLARY: 195 mg/dL — AB (ref 65–99)
GLUCOSE-CAPILLARY: 201 mg/dL — AB (ref 65–99)
GLUCOSE-CAPILLARY: 180 mg/dL — AB (ref 65–99)
Glucose-Capillary: 228 mg/dL — ABNORMAL HIGH (ref 65–99)

## 2015-10-08 LAB — MAGNESIUM: Magnesium: 2.4 mg/dL (ref 1.7–2.4)

## 2015-10-08 MED ORDER — GLIPIZIDE 5 MG PO TABS
5.0000 mg | ORAL_TABLET | Freq: Two times a day (BID) | ORAL | Status: DC
  Administered 2015-10-08: 5 mg via ORAL
  Filled 2015-10-08 (×3): qty 1

## 2015-10-08 MED ORDER — INSULIN ASPART 100 UNIT/ML ~~LOC~~ SOLN
0.0000 [IU] | Freq: Three times a day (TID) | SUBCUTANEOUS | Status: DC
  Administered 2015-10-08 – 2015-10-09 (×2): 2 [IU] via SUBCUTANEOUS
  Administered 2015-10-09: 3 [IU] via SUBCUTANEOUS
  Administered 2015-10-09: 1 [IU] via SUBCUTANEOUS
  Administered 2015-10-10: 3 [IU] via SUBCUTANEOUS

## 2015-10-08 MED ORDER — LATANOPROST 0.005 % OP SOLN
1.0000 [drp] | Freq: Every day | OPHTHALMIC | Status: DC
  Administered 2015-10-08 – 2015-10-09 (×3): 1 [drp] via OPHTHALMIC
  Filled 2015-10-08: qty 2.5

## 2015-10-08 MED ORDER — ONDANSETRON HCL 4 MG PO TABS: 4.0000 mg | ORAL_TABLET | Freq: Four times a day (QID) | ORAL | Status: DC | PRN

## 2015-10-08 MED ORDER — TIMOLOL MALEATE 0.5 % OP SOLN
1.0000 [drp] | Freq: Two times a day (BID) | OPHTHALMIC | Status: DC
  Administered 2015-10-08 – 2015-10-10 (×6): 1 [drp] via OPHTHALMIC
  Filled 2015-10-08: qty 5

## 2015-10-08 MED ORDER — TRAMADOL HCL 50 MG PO TABS: 50.0000 mg | ORAL_TABLET | Freq: Four times a day (QID) | ORAL | Status: DC | PRN

## 2015-10-08 MED ORDER — ONDANSETRON HCL 4 MG/2ML IJ SOLN: 4.0000 mg | Freq: Four times a day (QID) | INTRAMUSCULAR | Status: DC | PRN

## 2015-10-08 MED ORDER — CHLORHEXIDINE GLUCONATE CLOTH 2 % EX PADS
6.0000 | MEDICATED_PAD | Freq: Every day | CUTANEOUS | Status: DC
  Administered 2015-10-08 – 2015-10-10 (×3): 6 via TOPICAL

## 2015-10-08 MED ORDER — HYDROXYZINE HCL 25 MG PO TABS: 50.0000 mg | ORAL_TABLET | Freq: Three times a day (TID) | ORAL | Status: DC | PRN

## 2015-10-08 MED ORDER — BISMUTH SUBSALICYLATE 262 MG/15ML PO SUSP: 7.5000 mL | Freq: Four times a day (QID) | ORAL | Status: DC | PRN

## 2015-10-08 MED ORDER — POTASSIUM CHLORIDE 10 MEQ/100ML IV SOLN
10.0000 meq | INTRAVENOUS | Status: AC
  Administered 2015-10-08 (×2): 10 meq via INTRAVENOUS
  Filled 2015-10-08 (×2): qty 100

## 2015-10-08 MED ORDER — BARIUM SULFATE 2.1 % PO SUSP
450.0000 mL | ORAL | Status: AC
  Administered 2015-10-08: 1 mL via ORAL

## 2015-10-08 MED ORDER — POTASSIUM CHLORIDE IN NACL 20-0.9 MEQ/L-% IV SOLN
INTRAVENOUS | Status: DC
  Administered 2015-10-08 – 2015-10-09 (×3): via INTRAVENOUS
  Filled 2015-10-08 (×6): qty 1000

## 2015-10-08 MED ORDER — ACETAMINOPHEN 325 MG PO TABS: 650.0000 mg | ORAL_TABLET | Freq: Four times a day (QID) | ORAL | Status: DC | PRN

## 2015-10-08 MED ORDER — ADULT MULTIVITAMIN W/MINERALS CH
1.0000 | ORAL_TABLET | Freq: Every day | ORAL | Status: DC
  Administered 2015-10-08 – 2015-10-09 (×2): 1 via ORAL
  Filled 2015-10-08 (×2): qty 1

## 2015-10-08 MED ORDER — BACID PO TABS
1.0000 | ORAL_TABLET | Freq: Every day | ORAL | Status: DC
  Administered 2015-10-08 – 2015-10-10 (×3): 1 via ORAL
  Filled 2015-10-08 (×3): qty 1

## 2015-10-08 MED ORDER — ACETAMINOPHEN 650 MG RE SUPP: 650.0000 mg | Freq: Four times a day (QID) | RECTAL | Status: DC | PRN

## 2015-10-08 MED ORDER — B COMPLEX PO TABS: 1.0000 | ORAL_TABLET | Freq: Every day | ORAL | Status: DC

## 2015-10-08 MED ORDER — DEXAMETHASONE 2 MG PO TABS
2.0000 mg | ORAL_TABLET | Freq: Two times a day (BID) | ORAL | Status: DC
  Administered 2015-10-08 – 2015-10-10 (×5): 2 mg via ORAL
  Filled 2015-10-08 (×8): qty 1

## 2015-10-08 MED ORDER — INSULIN ASPART PROT & ASPART (70-30 MIX) 100 UNIT/ML ~~LOC~~ SUSP
60.0000 [IU] | Freq: Two times a day (BID) | SUBCUTANEOUS | Status: DC
  Administered 2015-10-08 – 2015-10-10 (×5): 60 [IU] via SUBCUTANEOUS
  Filled 2015-10-08 (×2): qty 10

## 2015-10-08 MED ORDER — MUPIROCIN 2 % EX OINT
1.0000 "application " | TOPICAL_OINTMENT | Freq: Two times a day (BID) | CUTANEOUS | Status: DC
  Administered 2015-10-08 – 2015-10-10 (×5): 1 via NASAL
  Filled 2015-10-08: qty 22

## 2015-10-08 MED ORDER — FENTANYL CITRATE (PF) 100 MCG/2ML IJ SOLN
50.0000 ug | INTRAMUSCULAR | Status: DC | PRN
  Administered 2015-10-08 – 2015-10-10 (×6): 50 ug via INTRAVENOUS
  Filled 2015-10-08 (×6): qty 2

## 2015-10-08 MED ORDER — VITAMIN C 500 MG PO TABS
500.0000 mg | ORAL_TABLET | Freq: Two times a day (BID) | ORAL | Status: DC
  Administered 2015-10-08 – 2015-10-09 (×3): 500 mg via ORAL
  Filled 2015-10-08 (×3): qty 1

## 2015-10-08 MED ORDER — POLYSACCHARIDE IRON COMPLEX 150 MG PO CAPS
150.0000 mg | ORAL_CAPSULE | Freq: Two times a day (BID) | ORAL | Status: DC
  Administered 2015-10-08 – 2015-10-10 (×4): 150 mg via ORAL
  Filled 2015-10-08 (×4): qty 1

## 2015-10-08 MED ORDER — POTASSIUM CHLORIDE CRYS ER 20 MEQ PO TBCR
40.0000 meq | EXTENDED_RELEASE_TABLET | Freq: Three times a day (TID) | ORAL | Status: AC
  Administered 2015-10-08 – 2015-10-09 (×6): 40 meq via ORAL
  Filled 2015-10-08 (×6): qty 2

## 2015-10-08 NOTE — H&P (Signed)
[facility] History and Physical  [name] [id number] DOB: [birth year] DOA: 10/07/2015  Referring physician: Dr.Knot. PCP: Shirline Frees, MD  Specialists: Dr.Gudena. Oncologist.  Chief Complaint: Abnormal labs.  HPI: [name] is a 70 y.o. 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 Labs in the ER shows worsening renal function with hypokalemia and indirect hyperbilirubinemia. Sonogram of abdomen shows gallstones with no definite features of cholecystitis. As per patient's daughter patient has not been eating well for last 5 days with patient getting more weak and bedridden. Patient otherwise denies any chest pain nausea vomiting abdominal pain or diarrhea. Patient's family also noticed increasing lump in the right upper quadrant.   Review of Systems: As presented in the history of presenting illness, rest negative.  Past Medical History  Diagnosis Date  . Venous insufficiency   . Venous stasis ulcer ([place])   . Diabetes mellitus due to underlying condition with diabetic nephropathy ([place])   . Neuropathy ([place])   . DJD (degenerative joint disease)   . Anemia   . 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 Atrial fibrillation ([place])   . Depression   . Seizures ([place])     only 1 due to dehydration   . Cancer [facility])     uterine cancer  . Constipation   . Glaucoma   . Glioblastoma [facility])    Past Surgical History  Procedure Laterality Date  . Tonsilectomy, adenoidectomy, bilateral myringotomy and tubes    . Mouth surgery    . I&d extremity Left 01/14/2014    Procedure: IRRIGATION AND DEBRIDEMENT LEFT LOWER EXTREMITY POSSIBLE ACELL AND POSSIBLE VAC PLACEMENT;  Surgeon: Theodoro Kos, DO;  Location: [place];  Service: Plastics;  Laterality: Left;  . Cervical ablation    . Brain biopsy Right  06/25/2015    Procedure: Right parietal stereotactic brain biopsy;  Surgeon: Consuella Lose, MD;  Location: [place] [facility];  Service: Neurosurgery;  Laterality: Right;  Right parietal stereotactic brain biopsy   Social History:  reports that she has never smoked. She has never used smokeless tobacco. She reports that she does not drink alcohol or use illicit drugs. Where does patient live home. Can patient participate in ADLs? Not sure.  Allergies  Allergen Reactions  . Bactrim [Sulfamethoxazole-Trimethoprim] Shortness Of Breath    Depression based reactions, altered mentality  . Sulfa Antibiotics Shortness Of Breath and Swelling    Depression based reactions, altered mentality  . Vancomycin Shortness Of Breath, Itching, Swelling and Rash    Red man's syndrome  . Atorvastatin Itching and Other (See Comments)    aches myalgias  . Celebrex [Celecoxib] Swelling, Rash and Other (See Comments)  . Ibuprofen Hives and Rash  . Latex Rash  . Other Dermatitis and Rash  . Pioglitazone Rash and Swelling  . Neosporin [Neomycin-Bacitracin Zn-Polymyx] Rash    Family History:  Family History  Problem Relation Age of Onset  . Heart disease Mother   . Heart disease Father   . Diabetes Maternal Grandmother       Prior to Admission medications   Medication Sig Start Date End Date Taking? Authorizing Provider  b complex vitamins tablet Take 1 tablet by mouth daily.   Yes Historical Provider, MD  bismuth subsalicylate (PEPTO BISMOL) 262 MG/15ML suspension Take 7.5 mLs by mouth every 6 (six) hours as needed for indigestion or diarrhea  or loose stools.   Yes Historical Provider, MD  bumetanide (BUMEX) 2 MG tablet Take 1 mg by mouth daily.  02/22/15  Yes Historical Provider, MD  COD LIVER OIL PO Take 1 capsule by mouth daily. Reported on 09/09/2015   Yes Historical Provider, MD  CRANBERRY PO Take 1 tablet by mouth 2 (two) times daily.   Yes Historical Provider, MD  dapsone 100 MG tablet Take 1 tablet  (100 mg total) by mouth daily. Take with food to help prevent any nausea/vomiting 07/06/15  Yes Nicholas Lose, MD  dexamethasone (DECADRON) 4 MG tablet Take 0.5 tablets (2 mg total) by mouth 2 (two) times daily with a meal. 09/09/15  Yes Tyler Pita, MD  glipiZIDE (GLUCOTROL) 10 MG tablet Take 10 mg by mouth 2 (two) times daily before a meal.    Yes Historical Provider, MD  hydrochlorothiazide 25 MG tablet Take 25 mg by mouth daily.    Yes Historical Provider, MD  hydrOXYzine (ATARAX/VISTARIL) 50 MG tablet Take 50 mg by mouth 3 (three) times daily as needed for itching.    Yes Historical Provider, MD  insulin NPH-insulin regular (NOVOLIN 70/30) (70-30) 100 UNIT/ML injection Inject 80 Units into the skin 2 (two) times daily with a meal. Sliding scale   Yes Historical Provider, MD  iron polysaccharides (NIFEREX) 150 MG capsule Take 150 mg by mouth 2 (two) times daily.   Yes Historical Provider, MD  lactobacillus acidophilus (BACID) TABS tablet Take 1 tablet by mouth daily.   Yes Historical Provider, MD  latanoprost (XALATAN) 0.005 % ophthalmic solution Place 1 drop into both eyes at bedtime. 04/27/15  Yes Historical Provider, MD  Multiple Vitamins-Minerals (MULTIVITAMIN WITH MINERALS) tablet Take 1 tablet by mouth daily.   Yes Historical Provider, MD  OVER THE COUNTER MEDICATION Take 1 tablet by mouth 2 (two) times daily. [place]   Yes Historical Provider, MD  potassium chloride (K-DUR,KLOR-CON) 10 MEQ tablet Take 1 tablet (10 mEq total) by mouth as directed. Take 2 tablets (20 meq) in the morning and 1 tablet (10 meq) in the evening. 09/15/15  Yes Nicholas Lose, MD  temozolomide (TEMODAR) 180 MG capsule Take 2 capsules (360 mg total) by mouth daily. Take 2 capsules daily X 5 days each month 09/14/15  Yes Nicholas Lose, MD  timolol (TIMOPTIC) 0.5 % ophthalmic solution Place 1 drop into both eyes 2 (two) times daily.  10/16/13  Yes Historical Provider, MD  vitamin C (ASCORBIC ACID) 500 MG tablet Take 500 mg  by mouth 2 (two) times daily.    Yes Historical Provider, MD  ACCU-CHEK AVIVA PLUS test strip USE AS DIRECTED 4 TIMES A DAY 06/14/15   Historical Provider, MD  acetaminophen (TYLENOL) 80 MG/0.8ML suspension Take 10 mg/kg by mouth every 4 (four) hours as needed for fever.    Historical Provider, MD  dexamethasone (DECADRON) 4 MG tablet Take 2 tablets (8 mg total) by mouth daily. Patient not taking: Reported on 10/07/2015 07/15/15   Nicholas Lose, MD  fluconazole (DIFLUCAN) 100 MG tablet Take 1 tablet (100 mg total) by mouth daily. Take 2 pills on the first day. Patient not taking: Reported on 10/07/2015 09/09/15   Tyler Pita, MD  insulin regular (NOVOLIN R,HUMULIN R) 100 units/mL injection Inject 0-20 Units into the skin daily as needed for high blood sugar. Sliding scale    Historical Provider, MD  nystatin cream (MYCOSTATIN) Apply 1 application topically daily as needed for dry skin.    Historical Provider, MD  traMADol Veatrice Bourbon) 50  MG tablet Take 50 mg by mouth every 6 (six) hours as needed for moderate pain.    Historical Provider, MD    Physical Exam: Filed Vitals:   10/07/15 2315 10/07/15 2330 10/07/15 2345 10/08/15 0015  BP: 126/90 118/79 108/56 105/74  Pulse: 95 95 96 95  Temp:      TempSrc:      Resp: 15 16 15 22   Height:      Weight:      SpO2: 96% 96% 96% 95%     General:  Obese and not in distress.  Eyes: Anicteric no pallor.  ENT: No discharge from the ears eyes nose and mouth.  Neck: No mass felt. No neck rigidity.  Cardiovascular: S1 and S2 heard.  Respiratory: No rhonchi or crepitations.  Abdomen: Soft nontender bowel sounds present. There is a small lump in the right upper quadrant.  Skin: Chronic left lower extremity wound.  Musculoskeletal: Chronic left lower extremity wound.  Psychiatric: Appears normal.  Neurologic: Alert awake oriented to time place and person. Moves all extremities.  Labs on Admission:  Basic Metabolic Panel:  Recent Labs Lab  10/05/15 1456 10/05/15 1545 10/07/15 1335  NA  --  134* 135  K  --  2.8* 2.8*  CL  --  92* 89*  CO2  --  26 29  GLUCOSE  --  334* 240*  BUN  --  50* 47*  CREATININE 1.80* 1.77* 1.68*  CALCIUM  --  9.4 8.8*  MG  --   --  2.3   Liver Function Tests:  Recent Labs Lab 10/05/15 1545 10/07/15 1335  AST 36 30  ALT 51 41  ALKPHOS 80 63  BILITOT 6.1* 6.9*  PROT 6.9 5.6*  ALBUMIN 3.9 3.2*   No results for input(s): LIPASE, AMYLASE in the last 168 hours. No results for input(s): AMMONIA in the last 168 hours. CBC:  Recent Labs Lab 10/05/15 1545 10/07/15 1509  WBC 15.0* 12.0*  NEUTROABS 13.0* 10.0*  HGB 14.5 12.9  HCT 39.8 35.8*  MCV 93.0 94.0  PLT 139* 92*   Cardiac Enzymes: No results for input(s): CKTOTAL, CKMB, CKMBINDEX, TROPONINI in the last 168 hours.  BNP (last 3 results) No results for input(s): BNP in the last 8760 hours.  ProBNP (last 3 results) No results for input(s): PROBNP in the last 8760 hours.  CBG: No results for input(s): GLUCAP in the last 168 hours.  Radiological Exams on Admission: US Abdomen Limited Ruq  10/07/2015  CLINICAL DATA:  RIGHT upper quadrant pain history of gallstones EXAM: US ABDOMEN LIMITED - RIGHT UPPER QUADRANT COMPARISON:  CT 06/24/2015, ultrasound 812 16 FINDINGS: Gallbladder: Again demonstrated multiple dependent gallstones within lumen gallbladder. No pericholecystic fluid. Wall is mildly thickened at 3.5 mm. Negative sonographic Murphy's sign. Common bile duct: Diameter: Normal common bile duct at 2 mm Liver: No focal lesion identified. Within normal limits in parenchymal echogenicity. IMPRESSION: 1. Multiple gallstones again demonstrated within lumen gallbladder. The gallbladder wall is minimally thickened. No pericholecystic fluid or gallbladder distension. Negative sonographic Murphy's sign. No evidence acute cholecystitis. 2. Normal common bile duct. Electronically Signed   By: Suzy Bouchard M.D.   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 Failure  to thrive - patient has not been eating well for last 5 days with decreased activity. Probably dehydrated. At this time patient did receive 1-1/2 L normal saline bolus in the ER. We will hold off patient's hydrochlorothiazide and Bumex. I have requested palliative care consult for further recommendations. 2. Hypokalemia probably from poor oral intake and diuretics - replace and recheck. Check magnesium levels. Holding all diuretics for now. 3. In direct hyperbilirubinemia - follow LFTs. 4. Right upper quadrant lump on examination - CT abdomen with by mouth contrast ordered. 5. Acute on chronic renal failure stage III - probably from dehydration and poor oral intake. Diuretics on hold patient did receive fluids in the ER. Follow metabolic panel. 6. Diabetes mellitus type 2 - since patient's oral intake is low I have decrease patient's NovoLog 70/30 from 80-60 units twice a day. Glipizide was decreased by half. Closely follow CBGs. 7. Glioblastoma multiforme - please notify patient's oncologist Dr. Lindi Adie in a.m. 8. Thrombocytopenia probably from chemotherapy - LDH is only mildly elevated. Closely follow CBC. 9. Gallstones - with no definite evidence of cholecystitis. Patient also has no abdominal tenderness. Closely observe.   DVT ProphylaxisLovenox.  Code Status: DO NOT RESUSCITATE.  Family Communication: Discussed with patient's daughter and husband.  Disposition Plan: Admit to inpatient.    KAKRAKANDY,ARSHAD N. [facility] Pager [phone].  If 7PM-7AM, please contact night-coverage [email or web address] Password [email or web address] 10/08/2015, 1:10 AM

## 2015-10-08 NOTE — Progress Notes (Addendum)
Patient seen and examined    70 y.o. 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 Patient is currently on hospice at home..  In the ER shows worsening renal function with hypokalemia and indirect hyperbilirubinemia. Sonogram of abdomen shows gallstones with no definite features of cholecystitis. As per patient's daughter patient has not been eating well for last 5 days with patient getting more weak and bedridden.  Patient is confused and does not recognize that she is in the hospital. No significant improvement in renal function despite fluids Will check ammonia level Multiple petechiae on her arms and legs, platelet count steadily declining 267>92  Plan #1 consult oncology regarding worsening liver function renal function platelet count, prognostic information,he recommends CT abdomen pelvis without contrast to further evaluate the patient's jaundice #2 replete electrolytes #3 DIC panel to further evaluate the patient's thrombocytopenia #4 discontinue glipizide in the setting of worsening renal function, continue SSI, NovoLog 70/30 #5 palliative care consultation for further management,

## 2015-10-08 NOTE — ED Notes (Signed)
Attempted to call report x 1 to [place], Therapist, sports. Reported she was unable to take report at this time. Requested name & number of RN which was provided.

## 2015-10-08 NOTE — Consult Note (Signed)
WOC wound consult note Reason for Consult: LLE and right great toe Wound type: Venous stasis ulcerations LLE, per daughter in the room patient was followed by wound care center, however with her decline in status she is not going to this clinic any longer.  Reports HHRN was changing the compression wrap on the LLE and stopped when Hospice was to take over however has not been changed in over a week.  Suspect arterial type ulceration on the right great toe Pressure Ulcer POA: No Measurement: Scattered ulcers over the pretibial region and the lateral calf of the LLE.  0.5cm x 0.5cm x 0 ulcer right great toe Wound bed: LLE mostly clean wounds, no purulence, pink, moist Right great toe; 100% eschar Drainage (amount, consistency, odor) none from the right great toe; LLE has some serosanguinous, no odor  Periwound: intact, hemosiderin staining bilaterally Dressing procedure/placement/frequency: Protect LLE ulcers with silicone foam to absorb exudate.  Continue Profore 4 layer compression. WOC to replace today will need to be changed Q Fridays.  Reminded caregiver to make sure hospice nurse aware. Paint right great toe wound with betadine to keep stable.  WOC will follow along with you for weekly Profore change, or will work with WTA to replace.  [place], [place]

## 2015-10-08 NOTE — ED Notes (Signed)
Attempted to call report x 3 to [place], Therapist, sports. Informed that RN is in isolation room, and will return call. Informed secretary that patient will be coming up with transport team, and RN may call for report when available.

## 2015-10-08 NOTE — Consult Note (Signed)
Consultation Note Date: 10/08/2015   Patient Name: [name]  DOB: 25-Aug-1946  MRN: [id number]  Age / Sex: 70 y.o., female  PCP: Shirline Frees, MD Referring Physician: Reyne Dumas, MD  Reason for Consultation: Establishing goals of care and Hospice Evaluation    Clinical Assessment/Narrative: Pt is a 70 yo female with newly dx glioblastoma in October. She underwent stereotactic brain biopsy and is being followed by Dr. Lindi Adie. She has been on PO palliative chemo agent until recently when she began to show renal impairment. Family describes a sharp clinical decline over the past week. She is now unable to stand, only eating bites and sips , sleeping more, and having expressive aphasia. Family reports that prior to 4 days ago she was able to get to [facility] and was eating and could communicate. She is currently under the care of [facility] in the home. Pt is a DNR. They are still interested to see if oncology can offer any life prolonging measures that do not negatively impact quality of life. CT of abdomen pending also as pt's abd is tender and now distended.  Contacts/Participants in Discussion: Primary Decision Maker: Adrian Saran   Relationship to Patient husband HCPOA: yes  Pt can answer simple questions only. She has 2 daughters and 1 son. Son is autistic. Daughter Almyra Free at the bedside  SUMMARY OF RECOMMENDATIONS Family desires feed back on what oncology perhaps has to offer. Dr. Lindi Adie to see pt this evening Family interested in CT of abdomen being done if possible but looks doubtful that she can drink contrast Introduced concept of in-patient hospice for EOL care. Do feel that she would meet criteria unless oncology is offering chemotherpay Will FU 10/09/15 at [place]: DNR    Code Status Orders        Start     Ordered   10/08/15 0109  Do not attempt resuscitation  (DNR)   Continuous    Question Answer Comment  In the event of cardiac or respiratory ARREST Do not call a "code blue"   In the event of cardiac or respiratory ARREST Do not perform Intubation, CPR, defibrillation or ACLS   In the event of cardiac or respiratory ARREST Use medication by any route, position, wound care, and other measures to relive pain and suffering. May use oxygen, suction and manual treatment of airway obstruction as needed for comfort.      10/08/15 0110    Code Status History    Date Active Date Inactive Code Status Order ID Comments User Context   10/07/2015 11:49 PM 10/08/2015  1:10 AM DNR [id number]  Leo Grosser, MD ED   06/25/2015  6:53 PM 06/26/2015  5:14 PM Full Code [id number]  Consuella Lose, MD Inpatient   02/13/2014 12:42 PM 02/17/2014  7:16 PM Full Code [id number]  Geradine Girt, DO Inpatient   01/14/2014 10:54 AM 01/17/2014  8:14 PM Full Code [id number]  Theodoro Kos, DO Inpatient   01/11/2014 10:44 PM 01/14/2014 10:54 AM Full Code [id number]  Etta Quill, DO ED      Other Directives:None  Symptom Management:   Pain: Is denying pain . Cont with ultram prn  Constipation: Add senna PO 2 tabs daily  Urinary retention: Insert foley  Palliative Prophylaxis:   Bowel Regimen, Delirium Protocol, Frequent Pain Assessment, Oral Care and Turn Reposition  Additional Recommendations (Limitations, Scope, Preferences):  Avoid Hospitalization, No Artificial Feeding, No Hemodialysis, No Radiation, No Surgical Procedures and No Tracheostomy  Psycho-social/Spiritual:  Support System: Strong Desire for further Chaplaincy support:no Additional Recommendations: Grief/Bereavement Support  Prognosis: < 4 weeks barring an acute event. Pt is declining very rapidly  Discharge Planning: Pending but in-pt hospice has been introduced   Chief Complaint/ Primary Diagnoses: Present on Admission:  . Indirect hyperbilirubinemia . Failure to thrive in adult . ARF (acute  renal failure) ([place]) . CKD (chronic kidney disease), stage III . Hypokalemia . 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 The following aspects are pertinent.  Past Medical History  Diagnosis Date  . Venous insufficiency   . Venous stasis ulcer ([place])   . Diabetes mellitus due to underlying condition with diabetic nephropathy ([place])   . Neuropathy ([place])   . DJD (degenerative joint disease)   . Anemia   . Nephropathy, diabetic ([place])   . Atrial fibrillation ([place])   . Depression   . Seizures ([place])     only 1 due to dehydration   . [facility])     uterine cancer  . Constipation   . Glaucoma   . Glioblastoma [facility])    Social History   Social History  . Marital Status: Married    Spouse Name: N/A  . Number of Children: N/A  . Years of Education: N/A   Social History Main Topics  . Smoking status: Never Smoker   . Smokeless tobacco: Never Used  . Alcohol Use: No  . Drug Use: No  . Sexual Activity: No   Other Topics Concern  . None   Social History Narrative   Family History  Problem Relation Age of Onset  . Heart disease Mother   . Heart disease Father   . Diabetes Maternal Grandmother    Scheduled Meds: . Chlorhexidine Gluconate Cloth  6 each Topical Q0600  . dexamethasone  2 mg Oral BID WC  . insulin aspart  0-9 Units Subcutaneous TID WC  . insulin aspart protamine- aspart  60 Units Subcutaneous BID WC  . iron polysaccharides  150 mg Oral BID WC  . lactobacillus acidophilus  1 tablet Oral Daily  . latanoprost  1 drop Both Eyes QHS  . multivitamin with minerals  1 tablet Oral Daily  . mupirocin ointment  1 application Nasal BID  . potassium chloride SA  40 mEq Oral TID  . timolol  1 drop Both Eyes BID  . vitamin C  500 mg Oral BID   Continuous Infusions: . 0.9 % NaCl with KCl 20 mEq / L 100 mL/hr at 10/08/15 1248   PRN  Meds:.acetaminophen **OR** acetaminophen, hydrOXYzine, ondansetron **OR** ondansetron (ZOFRAN) IV, traMADol Medications Prior to Admission:  Prior to Admission medications   Medication Sig Start Date End Date Taking? Authorizing Provider  b complex vitamins tablet Take 1 tablet by mouth daily.   Yes Historical Provider, MD  bismuth subsalicylate (PEPTO BISMOL) 262 MG/15ML suspension Take 7.5 mLs by mouth every 6 (six) hours as needed for indigestion or diarrhea or loose stools.   Yes Historical Provider, MD  bumetanide (BUMEX) 2 MG tablet Take 1 mg by mouth daily.  02/22/15  Yes Historical Provider, MD  COD LIVER OIL PO Take 1 capsule by mouth daily. Reported on 09/09/2015   Yes Historical Provider, MD  CRANBERRY PO Take 1 tablet by mouth 2 (two) times daily.   Yes Historical Provider, MD  dapsone 100 MG tablet Take 1 tablet (100 mg total)  by mouth daily. Take with food to help prevent any nausea/vomiting 07/06/15  Yes Nicholas Lose, MD  dexamethasone (DECADRON) 4 MG tablet Take 0.5 tablets (2 mg total) by mouth 2 (two) times daily with a meal. 09/09/15  Yes Tyler Pita, MD  glipiZIDE (GLUCOTROL) 10 MG tablet Take 10 mg by mouth 2 (two) times daily before a meal.    Yes Historical Provider, MD  hydrochlorothiazide 25 MG tablet Take 25 mg by mouth daily.    Yes Historical Provider, MD  hydrOXYzine (ATARAX/VISTARIL) 50 MG tablet Take 50 mg by mouth 3 (three) times daily as needed for itching.    Yes Historical Provider, MD  insulin NPH-insulin regular (NOVOLIN 70/30) (70-30) 100 UNIT/ML injection Inject 80 Units into the skin 2 (two) times daily with a meal. Sliding scale   Yes Historical Provider, MD  iron polysaccharides (NIFEREX) 150 MG capsule Take 150 mg by mouth 2 (two) times daily.   Yes Historical Provider, MD  lactobacillus acidophilus (BACID) TABS tablet Take 1 tablet by mouth daily.   Yes Historical Provider, MD  latanoprost (XALATAN) 0.005 % ophthalmic solution Place 1 drop into both  eyes at bedtime. 04/27/15  Yes Historical Provider, MD  Multiple Vitamins-Minerals (MULTIVITAMIN WITH MINERALS) tablet Take 1 tablet by mouth daily.   Yes Historical Provider, MD  OVER THE COUNTER MEDICATION Take 1 tablet by mouth 2 (two) times daily. [place]a   Yes Historical Provider, MD  potassium chloride (K-DUR,KLOR-CON) 10 MEQ tablet Take 1 tablet (10 mEq total) by mouth as directed. Take 2 tablets (20 meq) in the morning and 1 tablet (10 meq) in the evening. 09/15/15  Yes Nicholas Lose, MD  temozolomide (TEMODAR) 180 MG capsule Take 2 capsules (360 mg total) by mouth daily. Take 2 capsules daily X 5 days each month 09/14/15  Yes Nicholas Lose, MD  timolol (TIMOPTIC) 0.5 % ophthalmic solution Place 1 drop into both eyes 2 (two) times daily.  10/16/13  Yes Historical Provider, MD  vitamin C (ASCORBIC ACID) 500 MG tablet Take 500 mg by mouth 2 (two) times daily.    Yes Historical Provider, MD  ACCU-CHEK AVIVA PLUS test strip USE AS DIRECTED 4 TIMES A DAY 06/14/15   Historical Provider, MD  acetaminophen (TYLENOL) 80 MG/0.8ML suspension Take 10 mg/kg by mouth every 4 (four) hours as needed for fever.    Historical Provider, MD  dexamethasone (DECADRON) 4 MG tablet Take 2 tablets (8 mg total) by mouth daily. Patient not taking: Reported on 10/07/2015 07/15/15   Nicholas Lose, MD  fluconazole (DIFLUCAN) 100 MG tablet Take 1 tablet (100 mg total) by mouth daily. Take 2 pills on the first day. Patient not taking: Reported on 10/07/2015 09/09/15   Tyler Pita, MD  insulin regular (NOVOLIN R,HUMULIN R) 100 units/mL injection Inject 0-20 Units into the skin daily as needed for high blood sugar. Sliding scale    Historical Provider, MD  nystatin cream (MYCOSTATIN) Apply 1 application topically daily as needed for dry skin.    Historical Provider, MD  traMADol (ULTRAM) 50 MG tablet Take 50 mg by mouth every 6 (six) hours as needed for moderate pain.    Historical Provider, MD   Allergies  Allergen Reactions  .  Bactrim [Sulfamethoxazole-Trimethoprim] Shortness Of Breath    Depression based reactions, altered mentality  . Sulfa Antibiotics Shortness Of Breath and Swelling    Depression based reactions, altered mentality  . Vancomycin Shortness Of Breath, Itching, Swelling and Rash    Red man's syndrome  .  Atorvastatin Itching and Other (See Comments)    aches myalgias  . Celebrex [Celecoxib] Swelling, Rash and Other (See Comments)  . Ibuprofen Hives and Rash  . Latex Rash  . Other Dermatitis and Rash  . Pioglitazone Rash and Swelling  . Neosporin [Neomycin-Bacitracin Zn-Polymyx] Rash    Review of Systems  Unable to perform ROS: Mental status change    Physical Exam  Constitutional: She appears well-developed and well-nourished.  Moon face  HENT:  Head: Normocephalic.  Respiratory: Effort normal and breath sounds normal.  GI: She exhibits distension. There is tenderness.  Musculoskeletal:  Minimal movement to LE  Skin: Skin is warm.  petechiae  Psychiatric:  Affect flat; expressive aphasia    Vital Signs: BP 121/87 mmHg  Pulse 92  Temp(Src) 98 F (36.7 C) (Oral)  Resp 20  Ht 5\' 7"  (1.702 m)  Wt 111.902 kg (246 lb 11.2 oz)  BMI 38.63 kg/m2  SpO2 97%  SpO2: SpO2: 97 % O2 Device:SpO2: 97 % O2 Flow Rate: .   IO: Intake/output summary:  Intake/Output Summary (Last 24 hours) at 10/08/15 1259 Last data filed at 10/08/15 [id number]  Gross per 24 hour  Intake    420 ml  Output      0 ml  Net    420 ml    LBM: Last BM Date: 10/08/15 Baseline Weight: Weight: 117.935 kg (260 lb) Most recent weight: Weight: 111.902 kg (246 lb 11.2 oz)      Palliative Assessment/Data:  Flowsheet Rows        Most Recent Value   Intake Tab    Referral Department  Hospitalist   Unit at Time of Referral  Med/Surg Unit   Palliative Care Primary Diagnosis  Cancer   Date Notified  10/08/15   Palliative Care Type  New Palliative care   Date of Admission  10/07/15   Date first seen by Palliative  Care  10/08/15   # of days Palliative referral response time  0 Day(s)   # of days IP prior to Palliative referral  1   Clinical Assessment    Palliative Performance Scale Score  30%   Pain Max last 24 hours  Not able to report   Pain Min Last 24 hours  Not able to report   Dyspnea Max Last 24 Hours  Not able to report   Dyspnea Min Last 24 hours  Not able to report   Nausea Max Last 24 Hours  Not able to report   Nausea Min Last 24 Hours  Not able to report   Anxiety Max Last 24 Hours  Not able to report   Anxiety Min Last 24 Hours  Not able to report   Other Max Last 24 Hours  Not able to report   Psychosocial & Spiritual Assessment    Palliative Care Outcomes    Patient/Family meeting held?  Yes   Patient/Family wishes: Interventions discontinued/not started   Mechanical Ventilation, NIPPV, BiPAP, Hemodialysis   Palliative Care follow-up planned  Yes, Facility      Additional Data Reviewed:  CBC:    Component Value Date/Time   WBC 12.0* 10/07/2015 1509   WBC 16.8* 09/14/2015 1337   HGB 12.9 10/07/2015 1509   HGB 13.9 09/14/2015 1337   HCT 35.8* 10/07/2015 1509   HCT 40.4 09/14/2015 1337   PLT 92* 10/07/2015 1509   PLT 243 09/14/2015 1337   MCV 94.0 10/07/2015 1509   MCV 96.2 09/14/2015 1337   NEUTROABS 10.0* 10/07/2015 1509  NEUTROABS 15.0* 09/14/2015 1337   LYMPHSABS 1.1 10/07/2015 1509   LYMPHSABS 0.9 09/14/2015 1337   MONOABS 0.9 10/07/2015 1509   MONOABS 0.9 09/14/2015 1337   EOSABS 0.0 10/07/2015 1509   EOSABS 0.0 09/14/2015 1337   BASOSABS 0.0 10/07/2015 1509   BASOSABS 0.1 09/14/2015 1337   Comprehensive Metabolic Panel:    Component Value Date/Time   NA 135 10/07/2015 1335   NA 133* 09/14/2015 1337   K 2.8* 10/07/2015 1335   K 2.9* 09/14/2015 1337   CL 89* 10/07/2015 1335   CO2 29 10/07/2015 1335   CO2 30* 09/14/2015 1337   BUN 47* 10/07/2015 1335   BUN 35.4* 09/14/2015 1337   CREATININE 1.68* 10/07/2015 1335   CREATININE 1.3* 09/14/2015 1337     GLUCOSE 240* 10/07/2015 1335   GLUCOSE 171* 09/14/2015 1337   CALCIUM 8.8* 10/07/2015 1335   CALCIUM 9.0 09/14/2015 1337   AST 30 10/07/2015 1335   AST 17 09/14/2015 1337   ALT 41 10/07/2015 1335   ALT 31 09/14/2015 1337   ALKPHOS 63 10/07/2015 1335   ALKPHOS 56 09/14/2015 1337   BILITOT 6.9* 10/07/2015 1335   BILITOT 2.60* 09/14/2015 1337   PROT 5.6* 10/07/2015 1335   PROT 6.8 09/14/2015 1337   ALBUMIN 3.2* 10/07/2015 1335   ALBUMIN 3.8 09/14/2015 1337     Time In: 1200 Time Out: 1315 Time Total: 75 min Greater than 50%  of this time was spent counseling and coordinating care related to the above assessment and plan.Staffed with Dr. Allyson Sabal  Signed by: Dory Horn, NP  Dory Horn, NP  10/08/2015, 12:59 PM  Please contact Palliative Medicine Team phone at [phone] for questions and concerns.

## 2015-10-08 NOTE — ED Notes (Signed)
Attempted to call report x 2 to [place], Therapist, sports. No answer. Will call again.

## 2015-10-08 NOTE — Telephone Encounter (Addendum)
Late entry from 10/05/2015 at 1349. Received call from patient's daughter, Almyra Free. Almyra Free is requesting to cancel her mother's follow up tomorrow. Almyra Free states, "we can't get her out of the house again." She goes onto explain her mother is unable to walk, no BM in three days and poor appetite. She confirms her mother continues to take decadron 2 mg bid. Appointment cancelled. Dr. Tammi Klippel informed of these findings.

## 2015-10-09 ENCOUNTER — Inpatient Hospital Stay (HOSPITAL_COMMUNITY): Payer: Medicare Other

## 2015-10-09 DIAGNOSIS — R5383 Other fatigue: Secondary | ICD-10-CM

## 2015-10-09 DIAGNOSIS — R627 Adult failure to thrive: Secondary | ICD-10-CM

## 2015-10-09 DIAGNOSIS — Z515 Encounter for palliative care: Secondary | ICD-10-CM

## 2015-10-09 DIAGNOSIS — R531 Weakness: Secondary | ICD-10-CM

## 2015-10-09 DIAGNOSIS — C719 Malignant neoplasm of brain, unspecified: Secondary | ICD-10-CM

## 2015-10-09 DIAGNOSIS — E860 Dehydration: Secondary | ICD-10-CM

## 2015-10-09 DIAGNOSIS — D696 Thrombocytopenia, unspecified: Secondary | ICD-10-CM

## 2015-10-09 DIAGNOSIS — E876 Hypokalemia: Secondary | ICD-10-CM

## 2015-10-09 DIAGNOSIS — C711 Malignant neoplasm of frontal lobe: Secondary | ICD-10-CM

## 2015-10-09 DIAGNOSIS — N170 Acute kidney failure with tubular necrosis: Secondary | ICD-10-CM

## 2015-10-09 LAB — COMPREHENSIVE METABOLIC PANEL
ALBUMIN: 2.9 g/dL — AB (ref 3.5–5.0)
ALT: 43 U/L (ref 14–54)
ANION GAP: 10 (ref 5–15)
AST: 43 U/L — AB (ref 15–41)
Alkaline Phosphatase: 54 U/L (ref 38–126)
BUN: 23 mg/dL — AB (ref 6–20)
CHLORIDE: 105 mmol/L (ref 101–111)
CO2: 23 mmol/L (ref 22–32)
Calcium: 8.6 mg/dL — ABNORMAL LOW (ref 8.9–10.3)
Creatinine, Ser: 1.09 mg/dL — ABNORMAL HIGH (ref 0.44–1.00)
GFR calc Af Amer: 58 mL/min — ABNORMAL LOW (ref >60)
GFR, EST NON AFRICAN AMERICAN: 50 mL/min — AB (ref >60)
Glucose, Bld: 122 mg/dL — ABNORMAL HIGH (ref 65–99)
POTASSIUM: 4.1 mmol/L (ref 3.5–5.1)
Sodium: 138 mmol/L (ref 135–145)
Total Bilirubin: 4.6 mg/dL — ABNORMAL HIGH (ref 0.3–1.2)
Total Protein: 5.2 g/dL — ABNORMAL LOW (ref 6.5–8.1)

## 2015-10-09 LAB — MRSA PCR SCREENING: MRSA BY PCR: POSITIVE — AB

## 2015-10-09 LAB — URINALYSIS, ROUTINE W REFLEX MICROSCOPIC
BILIRUBIN URINE: NEGATIVE
GLUCOSE, UA: NEGATIVE mg/dL
HGB URINE DIPSTICK: NEGATIVE
KETONES UR: NEGATIVE mg/dL
Leukocytes, UA: NEGATIVE
Nitrite: NEGATIVE
PH: 5.5 (ref 5.0–8.0)
Protein, ur: NEGATIVE mg/dL
SPECIFIC GRAVITY, URINE: 1.018 (ref 1.005–1.030)

## 2015-10-09 LAB — GLUCOSE, CAPILLARY
GLUCOSE-CAPILLARY: 153 mg/dL — AB (ref 65–99)
GLUCOSE-CAPILLARY: 98 mg/dL (ref 65–99)
GLUCOSE-CAPILLARY: 225 mg/dL — AB (ref 65–99)
Glucose-Capillary: 200 mg/dL — ABNORMAL HIGH (ref 65–99)

## 2015-10-09 LAB — AMMONIA: AMMONIA: 41 umol/L — AB (ref 9–35)

## 2015-10-09 LAB — TSH: TSH: 3.388 u[IU]/mL (ref 0.350–4.500)

## 2015-10-09 MED ORDER — LORAZEPAM 0.5 MG PO TABS
0.5000 mg | ORAL_TABLET | ORAL | Status: DC | PRN
  Administered 2015-10-09: 0.5 mg via ORAL
  Filled 2015-10-09: qty 1

## 2015-10-09 NOTE — Clinical Social Work Note (Signed)
Clinical Social Work Assessment  Patient Details  Name: [name] MRN: [id number] Date of Birth: Mar 25, 1946  Date of referral:  10/09/15               Reason for consult:  Other (Comment Required) (Inpatient Hospice Referral)                Permission sought to share information with:  Case Manager, Facility Sport and exercise psychologist, Family Supports Permission granted to share information::     Name::        Agency::  [facility]  Relationship::  Husband and Daughter: Helayne [place] Information:     Housing/Transportation Living arrangements for the past 2 months:  Single Family Home Source of Information:  Medical Team, Adult Children, Spouse Patient Interpreter Needed:  None Criminal Activity/Legal Involvement Pertinent to Current Situation/Hospitalization:  No - Comment as needed Significant Relationships:  Adult Children, Spouse Lives with:  Spouse Do you feel safe going back to the place where you live?  No Need for family participation in patient care:  Yes (Comment)  Care giving concerns:  Family has met and planned with palliative care team for comfort care.  No current concerns at this time. Patient full comfort   Social Worker assessment / plan:  LCSW received call from RN with palliative team.  Patient being referred for Hospice Inpatient with preference to [facility].  Manuela Schwartz with [facility] is aware of consult and LCSW completed formal referral via phone and faxed information. Possible bed open on Sunday. Manuela Schwartz to call and be in touch with family regarding referral and consents   Employment status:  Retired Nurse, adult PT Recommendations:  Not assessed at this time Information / Referral to community resources:  Other (Comment Required) [facility] Placement and referral)  Patient/Family's Response to care:  Agreeable to plan of care  Patient/Family's Understanding of and Emotional Response to Diagnosis, Current Treatment, and  Prognosis:  Family is accepting of patient and comfort measures. Aware of process since working with palliative team and agreeable to plan.  Emotional Assessment Appearance:  Appears stated age Attitude/Demeanor/Rapport:  Other (Quiet, content and calm) Affect (typically observed):  Accepting Orientation:    Alcohol / Substance use:  Not Applicable Psych involvement (Current and /or in the community):  No (Comment)  Discharge Needs  Concerns to be addressed:  No discharge needs identified Readmission within the last 30 days:  No Current discharge risk:  None Barriers to Discharge:  Other [facility] referral sent, waiting for review and bed.)   [place], LCSW 10/09/2015, 2:50 PM

## 2015-10-09 NOTE — Progress Notes (Signed)
HEMATOLOGY-ONCOLOGY PROGRESS NOTE  SUBJECTIVE: Patient has been admitted with profound fatigue and weakness, elevation of total bilirubin. Recurrent loose stools, declining performance status [name] is a 70 year old lady with history of multiple brain lesions which were biopsied as multifocal glioblastoma and was started on palliative treatment with modalities and radiation on 07/19/2015. Even before starting treatment, she was counseled that the goal of treatment is purely to help her symptoms and to prolong her life. The treatment was not curative. She completed radiation and Temodar on 08/11/2015. She was started on maintenance Temodar on 09/14/2015. She was supposed to come to my office on 10/04/2014 but was extremely weak. We obtained blood work that revealed a creatinine of 1.77 and a bilirubin of 6.1. We instructed her to come to the hospital and get admitted. She was admitted through the emergency room.    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   07/19/2015 - 08/11/2015 Radiation Therapy Concurrent Temodar with radiation   09/14/2015 -  Chemotherapy Maintenance Temodar  day 1 through 5 every 4 weeks    OBJECTIVE: PHYSICAL EXAMINATION: ECOG PERFORMANCE STATUS: 4 - Bedbound  Filed Vitals:   10/08/15 2052 10/09/15 0544  BP: 114/68 108/51  Pulse: 94 89  Temp: 97.7 F (36.5 C) 97.6 F (36.4 C)  Resp: 20 20   Filed Weights   10/07/15 1154 10/08/15 0116 10/09/15 0544  Weight: 260 lb (117.935 kg) 246 lb 11.2 oz (111.902 kg) 249 lb 9.6 oz (113.218 kg)    Physical  exam: Generalized bruising and nosebleeds  HEENT: Facial puffiness Related to steroids  Lungs: dim BS Heart: S1S2 Nl Abd: obese Ext: bruising Neuro: diff with remembering words   LABORATORY DATA:  I have reviewed the data as listed CMP Latest Ref Rng 10/08/2015 10/07/2015 10/05/2015  Glucose 65 - 99 mg/dL 137(H) 240(H) 334(H)  BUN 6 - 20 mg/dL 35(H) 47(H) 50(H)  Creatinine 0.44 - 1.00 mg/dL 1.20(H) 1.68(H) 1.77(H)  Sodium 135 - 145 mmol/L 137 135 134(L)  Potassium 3.5 - 5.1 mmol/L 4.0 2.8(L) 2.8(L)  Chloride 101 - 111 mmol/L 99(L) 89(L) 92(L)  CO2 22 - 32 mmol/L 27 29 26   Calcium 8.9 - 10.3 mg/dL 8.6(L) 8.8(L) 9.4  Total Protein 6.5 - 8.1 g/dL 5.1(L) 5.6(L) 6.9  Total Bilirubin 0.3 - 1.2 mg/dL 5.2(H) 6.9(H) 6.1(H)  Alkaline Phos 38 - 126 U/L 56 63 80  AST 15 - 41 U/L 40 30 36  ALT 14 - 54 U/L 38 41 51    Lab Results  Component Value Date   WBC 10.7* 10/08/2015   HGB 10.8* 10/08/2015   HCT 29.5* 10/08/2015   MCV 93.7 10/08/2015   PLT 90* 10/08/2015   PLT 92* 10/08/2015   NEUTROABS 10.0* 10/07/2015    ASSESSMENT AND PLAN: 1. Multifocal glioblastoma: Status post Temodar with radiation. MRI of the brain on 10/05/2015 showed some decrease in the overall size of the tumor but there was a new cystic structure between the tumor within the carpal skeletal some measuring 2.5 cm which could reflect post radiation changes. Unfortunately her performance status has markedly worsened and given the underlying incurable nature  of her brain tumor, our focus should be on keeping her comfortable.  2. Elevated bilirubin primarily indirect bilirubin: Raises suspicion whether there could be hemolysis. However the LDH is not significantly elevated. There does not appear to be DIC given the normal coagulation centimeters and normal fibrinogen. I reviewed the peripheral smear which did not show any schistocytes. That being said, this may be a DIC in evolution.  3. Thrombocytopenia with extensive  bruising and nosebleeds   I would like her to stop temozolomide. I discussed with her daughter Almyra Free on the phone and I agreed that she would be best cared for at hospice home.   They have a meeting at 11:00 today with hospice and palliative care.

## 2015-10-09 NOTE — Progress Notes (Signed)
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 Labs in the ER shows worsening renal function with hypokalemia and indirect hyperbilirubinemia. Sonogram of abdomen shows gallstones with no definite features of cholecystitis. As per patient's daughter patient has not been eating well for last 5 days with patient getting more weak and bedridden. She was started on maintenance Temodar on 09/14/2015. She was supposed to see oncology on 10/04/2014 but has become extremely weak. Labs revealed creatinine of 1.77 and a bilirubin of 6.1. Oncology instructed her to come to the hospital and get admitted. She was admitted through the emergency room.  Assessment and plan Generalized Weakness-multifactorial-secondary to hypokalemia, renal failure, jaundice, probable hepatic cirrhosis with slightly elevated ammonia level, underlying malignancy ECOG PERFORMANCE STATUS: 4 - Bedbound Chemotherapy discontinued by oncology MRI of the brain on 10/05/2015 showed some decrease in the overall size of the tumor but there was a new cystic structure between the tumor within the carpal skeletal some measuring 2.5 cm which could reflect post radiation changes. Unfortunately her performance status has markedly worsened and given the underlying incurable nature of her brain  tumor, our focus should be on keeping her comfortable.   Hypokalemia-repleted  Acute kidney injury-prerenal , 1.8> 1.2 resolved with IV fluids  Leukocytosis -no underlying  source of infection, UA negative, lungs clear on the CT scan of abdomen, will obtain chest x-ray  Thrombocytopenia- side effect of chemotherapy?temozolomide. Discontinued No active DIC suspected, however DIC in evolution cannot be ruled out  DVT prophylaxsis SCDs  Code Status:      Code Status Orders    DO NOT RESUSCITATE    Start     Ordered    Family Communication: Discussed in detail with the patient, all imaging results, lab results explained to the patient   Disposition Plan:  Oncology recommends hospice at home     Consultants:  Palliative care  Oncology    Procedures:  None  Antibiotics: Anti-infectives    None         HPI/Subjective:  confused , denies nose bleeds overnight   Objective: Filed Vitals:   10/08/15 0116 10/08/15 0808 10/08/15 2052 10/09/15 0544  BP: 121/66 121/87 114/68 108/51  Pulse: 99 92 94 89  Temp: 98 F (36.7 C) 98 F (36.7 C) 97.7 F (36.5 C) 97.6 F (36.4 C)  TempSrc: Oral Oral Oral Oral  Resp: 20 20 20 20   Height: 5\' 7"  (1.702 m)     Weight: 111.902 kg (246 lb 11.2 oz)   113.218 kg (249 lb 9.6 oz)  SpO2: 100% 97% 98% 98%    Intake/Output Summary (Last 24 hours) at 10/09/15 1010 Last data filed at 10/09/15 0923  Gross per 24 hour  Intake 1453.33 ml  Output      0 ml  Net 1453.33 ml    Exam:  General: moon facies, flat affect Lungs:  Clear to auscultation bilaterally without wheezes or crackles Cardiovascular: Regular rate and rhythm without murmur gallop or rub normal S1 and S2 Abdomen: Nontender, nondistended, soft, bowel sounds positive, no rebound, no ascites, no appreciable mass Extremities: Multiple petechiae on bilateral upper and lower extremities     Data Review   Micro Results Recent Results (from the past 240 hour(s))   MRSA PCR Screening     Status: Abnormal   Collection Time: 10/08/15  3:46 AM  Result Value Ref Range Status   MRSA by PCR POSITIVE (A) NEGATIVE Final    Comment:        The GeneXpert MRSA Assay (FDA approved for NASAL specimens only), is one component of a comprehensive MRSA colonization surveillance program. It is not intended to diagnose MRSA infection nor to guide or monitor treatment for MRSA infections. RESULT CALLED TO, READ BACK BY AND VERIFIED WITHKathrin Greathouse 10/08/15 @ [place]     Radiology Reports Ct Abdomen Pelvis Wo Contrast  10/08/2015  CLINICAL DATA:  Abdominal swelling and discomfort. nausea and vomiting. Chronic kidney disease stage 3. Uterine carcinoma. EXAM: CT ABDOMEN AND PELVIS WITHOUT CONTRAST TECHNIQUE: Multidetector CT imaging of the abdomen and pelvis was performed following the standard protocol without IV contrast. COMPARISON:  06/24/2015 FINDINGS: Lower chest:  No acute findings. Hepatobiliary: No mass visualized on this un-enhanced exam. Probable hepatic cirrhosis. Numerous tiny gallstones again seen, without evidence of cholecystitis or biliary ductal dilatation. Pancreas: No mass or inflammatory process identified on this un-enhanced exam. Spleen: Within normal limits in size. Adrenals/Urinary Tract: No evidence of urolithiasis or hydronephrosis. No definite mass visualized on this un-enhanced exam. Urinary bladder is mildly distended but otherwise unremarkable in appearance on this unenhanced exam. Stomach/Bowel: No evidence of obstruction, inflammatory process, or abnormal fluid collections. Normal appendix visualized. Mild left colonic diverticulosis again noted, without evidence of diverticulitis. Vascular/Lymphatic: No pathologically enlarged lymph nodes. No evidence of abdominal aortic aneurysm. Reproductive: No mass or other significant abnormality. IUD again visualized within uterus. Other: None. Musculoskeletal:  No suspicious bone lesions  identified. IMPRESSION: Cholelithiasis.  No radiographic evidence of cholecystitis. Colonic diverticulosis. No radiographic evidence of diverticulitis. Probable hepatic cirrhosis.  No evidence of splenomegaly or ascites. IUD in expected position. Electronically Signed   By: Earle Gell M.D.   On: 10/08/2015 17:34   Mr [name] [id number] Contrast  10/05/2015  CLINICAL DATA:  70 year old female with glioblastoma. Completion of chemoradiation. With a taper of steroids patient did develop neurological symptoms. Diabetes. Atrial fibrillation. Subsequent encounter. EXAM: MRI HEAD WITHOUT AND WITH CONTRAST TECHNIQUE: Multiplanar, multiecho pulse sequences of the brain and surrounding structures were obtained without and with intravenous contrast. CONTRAST:  98mL MULTIHANCE GADOBENATE DIMEGLUMINE 529 MG/ML IV SOLN COMPARISON:  06/24/2015 brain MR. FINDINGS: There has been a change in the appearance of the multi focal glioblastoma. Component of tumor which extends across the splenium of the corpus callosum has decreased in overall size and enhancement. For instance, central aspect the splenium of corpus callosum now with AP dimension of 1.1 cm versus prior 1.9 cm. Tumor tumor in the posterior superior left temporal lobe has decreased in size now measuring 0.8 x 0.9 x 0.7 cm versus prior 1 x 1 x 0.9 cm. Tumor within the posterior left frontal lobe now with less enhancement and mall central necrotic component currently spanning over 3.9 x 2.5 x 1.9 cm previously measuring 4 x 3 x 1.8 cm. Satellite lesions within the parietal lobes have decreased in size and  enhancement most notable involving the left parietal lobe where enhancing lesion now measures 2 mm versus prior 8 mm. New from the prior examination is a nonenhancing lobulated cystic appearing structure insinuating between the tumor within the splenium of the corpus callosum and the cavum velum interpositum. This spans over 2.5 x 1.6 x 1.8 cm. It is possible this represents  post radiation therapy benign cyst rather than progressive tumor however, this will require MR surveillance. Blood breakdown products are seen within portions of the necrotic multi focal glioblastoma. Diffuse white matter changes some of which may represent treatment of tumor however, as is typical with glioblastoma, tumor cells may be contain within the white matter findings. Additionally, patient has a history of diabetes and the patchy nature of other white matter changes suggest there is a component of underlying small vessel disease. Major intracranial vascular structures are patent. No acute thrombotic infarct. Global atrophy without hydrocephalus. Mild exophthalmos.  Post lens replacement. Pressure monitoring device may be in place right parietal region. Post radiation therapy changes of the osseous structures. Radiation therapy changes of bone marrow throughout the calvarium skullbase and upper cervical spine. Mild degenerative changes C3-4. Minimal anterior slip C4. Mild bulge C5-6. IMPRESSION: Change in appearance of multifocal glioma which has decreased in degree enhancement and slight decrease in overall size as detailed above. Central components now appear more necrotic. New from the prior examination is a nonenhancing lobulated cystic appearing structure insinuating between the tumor within the splenium of the corpus callosum and the cavum velum interpositum. This spans over 2.5 x 1.6 x 1.8 cm. It is possible this represents post radiation therapy benign cyst rather than progressive tumor however, this will require MR surveillance. Electronically Signed   By: Genia Del M.D.   On: 10/05/2015 18:39   US Abdomen Limited Ruq  10/07/2015  CLINICAL DATA:  RIGHT upper quadrant pain history of gallstones EXAM: US ABDOMEN LIMITED - RIGHT UPPER QUADRANT COMPARISON:  CT 06/24/2015, ultrasound 812 16 FINDINGS: Gallbladder: Again demonstrated multiple dependent gallstones within lumen gallbladder. No  pericholecystic fluid. Wall is mildly thickened at 3.5 mm. Negative sonographic Murphy's sign. Common bile duct: Diameter: Normal common bile duct at 2 mm Liver: No focal lesion identified. Within normal limits in parenchymal echogenicity. IMPRESSION: 1. Multiple gallstones again demonstrated within lumen gallbladder. The gallbladder wall is minimally thickened. No pericholecystic fluid or gallbladder distension. Negative sonographic Murphy's sign. No evidence acute cholecystitis. 2. Normal common bile duct. Electronically Signed   By: Suzy Bouchard M.D.   On: 10/07/2015 19:33     CBC  Recent Labs Lab 10/05/15 1545 10/07/15 1509 10/08/15 1507  WBC 15.0* 12.0* 10.7*  HGB 14.5 12.9 10.8*  HCT 39.8 35.8* 29.5*  PLT 139* 92* 92*  90*  MCV 93.0 94.0 93.7  MCH 33.9 33.9 34.3*  MCHC 36.4* 36.0 36.6*  RDW 17.0* 17.6* 17.9*  LYMPHSABS 0.8 1.1  --   MONOABS 1.1* 0.9  --   EOSABS 0.0 0.0  --   BASOSABS 0.0 0.0  --     Chemistries   Recent Labs Lab 10/05/15 1456 10/05/15 1545 10/07/15 1335 10/08/15 1507  NA  --  134* 135 137  K  --  2.8* 2.8* 4.0  CL  --  92* 89* 99*  CO2  --  26 29 27   GLUCOSE  --  334* 240* 137*  BUN  --  50* 47* 35*  CREATININE 1.80* 1.77* 1.68* 1.20*  CALCIUM  --  9.4 8.8* 8.6*  MG  --   --  2.3 2.4  AST  --  36 30 40  ALT  --  51 41 38  ALKPHOS  --  80 63 56  BILITOT  --  6.1* 6.9* 5.2*   ------------------------------------------------------------------------------------------------------------------ estimated creatinine clearance is 56.6 mL/min (by C-G formula based on Cr of 1.2). ------------------------------------------------------------------------------------------------------------------ No results for input(s): HGBA1C in the last 72 hours. ------------------------------------------------------------------------------------------------------------------ No results for input(s): CHOL, HDL, LDLCALC, TRIG, CHOLHDL, LDLDIRECT in the last 72  hours. ------------------------------------------------------------------------------------------------------------------ No results for input(s): TSH, T4TOTAL, T3FREE, THYROIDAB in the last 72 hours.  Invalid input(s): FREET3 ------------------------------------------------------------------------------------------------------------------ No results for input(s): VITAMINB12, FOLATE, FERRITIN, TIBC, IRON, RETICCTPCT in the last 72 hours.  Coagulation profile  Recent Labs Lab 10/08/15 1507  INR 1.14     Recent Labs  10/08/15 1507  DDIMER 4.07*    Cardiac Enzymes No results for input(s): CKMB, TROPONINI, MYOGLOBIN in the last 168 hours.  Invalid input(s): CK ------------------------------------------------------------------------------------------------------------------ Invalid input(s): POCBNP   CBG:  Recent Labs Lab 10/08/15 0607 10/08/15 1117 10/08/15 1750 10/08/15 2059 10/09/15 0700  GLUCAP 195* 228* 201* 180* 153*       Studies: Ct Abdomen Pelvis Wo Contrast  10/08/2015  CLINICAL DATA:  Abdominal swelling and discomfort. nausea and vomiting. Chronic kidney disease stage 3. Uterine carcinoma. EXAM: CT ABDOMEN AND PELVIS WITHOUT CONTRAST TECHNIQUE: Multidetector CT imaging of the abdomen and pelvis was performed following the standard protocol without IV contrast. COMPARISON:  06/24/2015 FINDINGS: Lower chest:  No acute findings. Hepatobiliary: No mass visualized on this un-enhanced exam. Probable hepatic cirrhosis. Numerous tiny gallstones again seen, without evidence of cholecystitis or biliary ductal dilatation. Pancreas: No mass or inflammatory process identified on this un-enhanced exam. Spleen: Within normal limits in size. Adrenals/Urinary Tract: No evidence of urolithiasis or hydronephrosis. No definite mass visualized on this un-enhanced exam. Urinary bladder is mildly distended but otherwise unremarkable in appearance on this unenhanced exam. Stomach/Bowel:  No evidence of obstruction, inflammatory process, or abnormal fluid collections. Normal appendix visualized. Mild left colonic diverticulosis again noted, without evidence of diverticulitis. Vascular/Lymphatic: No pathologically enlarged lymph nodes. No evidence of abdominal aortic aneurysm. Reproductive: No mass or other significant abnormality. IUD again visualized within uterus. Other: None. Musculoskeletal:  No suspicious bone lesions identified. IMPRESSION: Cholelithiasis.  No radiographic evidence of cholecystitis. Colonic diverticulosis. No radiographic evidence of diverticulitis. Probable hepatic cirrhosis.  No evidence of splenomegaly or ascites. IUD in expected position. Electronically Signed   By: Earle Gell M.D.   On: 10/08/2015 17:34   US Abdomen Limited Ruq  10/07/2015  CLINICAL DATA:  RIGHT upper quadrant pain history of gallstones EXAM: US ABDOMEN LIMITED - RIGHT UPPER QUADRANT COMPARISON:  CT 06/24/2015, ultrasound 812 16 FINDINGS: Gallbladder: Again demonstrated multiple dependent gallstones within lumen gallbladder. No pericholecystic fluid. Wall is mildly thickened at 3.5 mm. Negative sonographic Murphy's sign. Common bile duct: Diameter: Normal common bile duct at 2 mm Liver: No focal lesion identified. Within normal limits in parenchymal echogenicity. IMPRESSION: 1. Multiple gallstones again demonstrated within lumen gallbladder. The gallbladder wall is minimally thickened. No pericholecystic fluid or gallbladder distension. Negative sonographic Murphy's sign. No evidence acute cholecystitis. 2. Normal common bile duct. Electronically Signed   By: Suzy Bouchard M.D.   On: 10/07/2015 19:33      Lab Results  Component Value Date   HGBA1C 6.3* 02/13/2014   Lab Results  Component Value Date   CREATININE 1.20* 10/08/2015       Scheduled Meds: . Chlorhexidine Gluconate  Cloth  6 each Topical [id number]  . dexamethasone  2 mg Oral BID WC  . insulin aspart  0-9 Units Subcutaneous  TID WC  . insulin aspart protamine- aspart  60 Units Subcutaneous BID WC  . iron polysaccharides  150 mg Oral BID WC  . lactobacillus acidophilus  1 tablet Oral Daily  . latanoprost  1 drop Both Eyes QHS  . multivitamin with minerals  1 tablet Oral Daily  . mupirocin ointment  1 application Nasal BID  . potassium chloride SA  40 mEq Oral TID  . timolol  1 drop Both Eyes BID  . vitamin C  500 mg Oral BID   Continuous Infusions: . 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 If 7PM-7AM, please contact night-coverage at [email or web address], password [facility] 10/09/2015, 10:10 AM  LOS: 2 days

## 2015-10-09 NOTE — Progress Notes (Signed)
Daily Progress Note   Patient Name: [name]       Date: 10/09/2015 DOB: [birth year]  Age: 70 y.o. MRN#: [id number] Attending Physician: Reyne Dumas, MD Primary Care Physician: Shirline Frees, MD Admit Date: 10/07/2015  Reason for Consultation/Follow-up: Establishing goals of care, Inpatient hospice referral and Non pain symptom management  Subjective: Oncologist in to see pt and called daughter and husband. Not able to offer any palliative treatments at this point. Family tearful but understands. Pt seems anxious. Frequently apologizing for needing care, worried she is a burden. Abdominal CT shows cholelithiasis, diverticulosis; IUD in place. No obstruction. Pt had BM 10/08/15. Now has foley which has also provided relief of abdominal pain. Is currently denying any pain Interval Events: Oncologist consult; Abd CT Length of Stay: 2 days  Current Medications: Scheduled Meds:  . Chlorhexidine Gluconate Cloth  6 each Topical Q0600  . dexamethasone  2 mg Oral BID WC  . insulin aspart  0-9 Units Subcutaneous TID WC  . insulin aspart protamine- aspart  60 Units Subcutaneous BID WC  . iron polysaccharides  150 mg Oral BID WC  . lactobacillus acidophilus  1 tablet Oral Daily  . latanoprost  1 drop Both Eyes QHS  . multivitamin with minerals  1 tablet Oral Daily  . mupirocin ointment  1 application Nasal BID  . potassium chloride SA  40 mEq Oral TID  . timolol  1 drop Both Eyes BID  . vitamin C  500 mg Oral BID    Continuous Infusions:    PRN Meds: acetaminophen **OR** acetaminophen, fentaNYL (SUBLIMAZE) injection, hydrOXYzine, LORazepam, ondansetron **OR** ondansetron (ZOFRAN) IV, traMADol  Physical Exam: Physical Exam  Constitutional: She appears well-nourished.  Cardiovascular:  Normal rate and regular rhythm.   Pulmonary/Chest: Effort normal.  Abdominal: Soft. She exhibits distension.  Genitourinary:  foley  Neurological: She is alert.  Word finding difficulty. Oriented to self. Knows her family.   Skin:  Petechiae. Scant intermittent bleeding from right nares  Psychiatric: She has a normal mood and affect.                Vital Signs: BP 120/79 mmHg  Pulse 95  Temp(Src) 98.1 F (36.7 C) (Oral)  Resp 20  Ht 5\' 7"  (1.702 m)  Wt 113.218 kg (249 lb 9.6 oz)  BMI 39.08  kg/m2  SpO2 100% SpO2: SpO2: 100 % O2 Device: O2 Device: Not Delivered O2 Flow Rate:    Intake/output summary:  Intake/Output Summary (Last 24 hours) at 10/09/15 1319 Last data filed at 10/09/15 [id number]  Gross per 24 hour  Intake 1453.33 ml  Output      0 ml  Net 1453.33 ml   LBM: Last BM Date: 10/08/15 Baseline Weight: Weight: 117.935 kg (260 lb) Most recent weight: Weight: 113.218 kg (249 lb 9.6 oz)       Palliative Assessment/Data: Flowsheet Rows        Most Recent Value   Intake Tab    Referral Department  Hospitalist   Unit at Time of Referral  Med/Surg Unit   Palliative Care Primary Diagnosis  Cancer   Date Notified  10/08/15   Palliative Care Type  New Palliative care   Date of Admission  10/07/15   Date first seen by Palliative Care  10/08/15   # of days Palliative referral response time  0 Day(s)   # of days IP prior to Palliative referral  1   Clinical Assessment    Palliative Performance Scale Score  30%   Pain Max last 24 hours  0   Pain Min Last 24 hours  0   Dyspnea Max Last 24 Hours  0   Dyspnea Min Last 24 hours  0   Nausea Max Last 24 Hours  0   Nausea Min Last 24 Hours  0   Anxiety Max Last 24 Hours  4   Anxiety Min Last 24 Hours  2   Other Max Last 24 Hours  Not able to report   Psychosocial & Spiritual Assessment    Palliative Care Outcomes    Patient/Family meeting held?  Yes   Who was at the meeting?  daughter and husband   [place] regarding hospice, Clarified goals of care, Transitioned to hospice, Changed to focus on comfort   Patient/Family wishes: Interventions discontinued/not started   Mechanical Ventilation, NIPPV, BiPAP, Hemodialysis   Palliative Care follow-up planned  No      Additional Data Reviewed: CBC    Component Value Date/Time   WBC 10.7* 10/08/2015 1507   WBC 16.8* 09/14/2015 1337   RBC 3.15* 10/08/2015 1507   RBC 4.20 09/14/2015 1337   HGB 10.8* 10/08/2015 1507   HGB 13.9 09/14/2015 1337   HCT 29.5* 10/08/2015 1507   HCT 40.4 09/14/2015 1337   PLT 90* 10/08/2015 1507   PLT 92* 10/08/2015 1507   PLT 243 09/14/2015 1337   MCV 93.7 10/08/2015 1507   MCV 96.2 09/14/2015 1337   MCH 34.3* 10/08/2015 1507   MCH 33.2 09/14/2015 1337   MCHC 36.6* 10/08/2015 1507   MCHC 34.5 09/14/2015 1337   RDW 17.9* 10/08/2015 1507   RDW 16.3* 09/14/2015 1337   LYMPHSABS 1.1 10/07/2015 1509   LYMPHSABS 0.9 09/14/2015 1337   MONOABS 0.9 10/07/2015 1509   MONOABS 0.9 09/14/2015 1337   EOSABS 0.0 10/07/2015 1509   EOSABS 0.0 09/14/2015 1337   BASOSABS 0.0 10/07/2015 1509   BASOSABS 0.1 09/14/2015 1337    CMP     Component Value Date/Time   NA 138 10/09/2015 1053   NA 133* 09/14/2015 1337   K 4.1 10/09/2015 1053   K 2.9* 09/14/2015 1337   CL 105 10/09/2015 1053   CO2 23 10/09/2015 1053   CO2 30* 09/14/2015 1337   GLUCOSE 122* 10/09/2015 1053   GLUCOSE 171*  09/14/2015 1337   BUN 23* 10/09/2015 1053   BUN 35.4* 09/14/2015 1337   CREATININE 1.09* 10/09/2015 1053   CREATININE 1.3* 09/14/2015 1337   CALCIUM 8.6* 10/09/2015 1053   CALCIUM 9.0 09/14/2015 1337   PROT 5.2* 10/09/2015 1053   PROT 6.8 09/14/2015 1337   ALBUMIN 2.9* 10/09/2015 1053   ALBUMIN 3.8 09/14/2015 1337   AST 43* 10/09/2015 1053   AST 17 09/14/2015 1337   ALT 43 10/09/2015 1053   ALT 31 09/14/2015 1337   ALKPHOS 54 10/09/2015 1053   ALKPHOS 56 09/14/2015 1337   BILITOT 4.6* 10/09/2015 1053   BILITOT  2.60* 09/14/2015 1337   GFRNONAA 50* 10/09/2015 1053   GFRAA 58* 10/09/2015 1053       Problem List:  Patient Active Problem List   Diagnosis Date Noted  . Dehydration   . Failure to thrive in adult 10/08/2015  . ARF (acute renal failure) ([place]) 10/08/2015  . Indirect hyperbilirubinemia 10/07/2015  . DNR (do not resuscitate) discussion   . Palliative care encounter   . Neoplastic malignant related fatigue   . Encounter for chemotherapy management 07/08/2015  . 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 Brain tumor ([place]) 06/25/2015  . Elevated troponin 02/15/2014  . Fever 02/13/2014  . Leukocytosis 02/13/2014  . Adverse drug reaction 01/26/2014  . Morbid obesity- BMI 49 01/16/2014  . Paroxysmal atrial fibrillation ([place]) 01/15/2014  . Diabetic leg ulcer ([place]) 01/14/2014  . Renal failure, acute on chronic ([facility]) 01/14/2014  . CKD (chronic kidney disease), stage III 01/13/2014  . Hypokalemia 01/13/2014  . Anemia 01/13/2014  . Sepsis ([place]) 01/11/2014  . Ulcer of left lower leg ([place]) 01/11/2014  . Type II or unspecified type diabetes mellitus with unspecified complication, uncontrolled 12/24/2013  . Venous stasis ulcer of left lower extremity ([place]) 12/24/2013     Palliative Care Assessment & Plan    1.Code Status:  DNR    Code Status Orders        Start     Ordered   10/08/15 0109  Do not attempt resuscitation (DNR)   Continuous    Question Answer Comment  In the event of cardiac or respiratory ARREST Do not call a "code blue"   In the event of cardiac or respiratory ARREST Do not perform Intubation, CPR, defibrillation or ACLS   In the event of cardiac or respiratory ARREST Use medication by any route, position, wound care, and other measures to relive pain and suffering. May use oxygen, suction and manual treatment of airway obstruction as needed for comfort.      10/08/15 0110    Code Status History    Date Active  Date Inactive Code Status Order ID Comments User Context   10/07/2015 11:49 PM 10/08/2015  1:10 AM DNR [id number]  Leo Grosser, MD ED   06/25/2015  6:53 PM 06/26/2015  5:14 PM Full Code [id number]  Consuella Lose, MD Inpatient   02/13/2014 12:42 PM 02/17/2014  7:16 PM Full Code [id number]  Geradine Girt, DO Inpatient   01/14/2014 10:54 AM 01/17/2014  8:14 PM Full Code [id number]  Theodoro Kos, DO Inpatient   01/11/2014 10:44 PM 01/14/2014 10:54 AM Full Code [id number]  Etta Quill, DO ED       2. Goals of Care/Additional Recommendations:  Full comfort care  Transfer to in-pt hospice preferably [facility]  Limitations on Scope of Treatment: Full Comfort Care  Desire for further Chaplaincy support:no  Psycho-social Needs: Grief/Bereavement Support  3. Symptom Management:      1.Pain: No further c/o abd pain. PRN ultram available       2. Urinary retention: Improved. Continue with foley       3. Anxiety: Will add PRN ativan.  4. Palliative Prophylaxis:   Bowel Regimen, Delirium Protocol, Frequent Pain Assessment, Oral Care, Palliative Wound Care and Turn Reposition  5. Prognosis: < 4 weeks  6. Discharge Planning:  Hospice facility   Care plan was discussed with Dr. Allyson Sabal  Thank you for allowing the Palliative Medicine Team to assist in the care of this patient.   Time In: 1200 Time Out: 1240 Total Time 40 min Prolonged Time Billed  no         Dory Horn, NP  10/09/2015, 1:19 PM  Please contact Palliative Medicine Team phone at [phone] for questions and concerns.

## 2015-10-10 LAB — GLUCOSE, CAPILLARY
GLUCOSE-CAPILLARY: 97 mg/dL (ref 65–99)
Glucose-Capillary: 154 mg/dL — ABNORMAL HIGH (ref 65–99)

## 2015-10-10 MED ORDER — MORPHINE SULFATE (CONCENTRATE) 20 MG/ML PO SOLN: 5.0000 mg | ORAL | Status: AC | PRN

## 2015-10-10 MED ORDER — LORAZEPAM 0.5 MG PO TABS: 0.5000 mg | ORAL_TABLET | ORAL | Status: AC | PRN

## 2015-10-10 MED ORDER — HYDROXYZINE HCL 50 MG PO TABS: 50.0000 mg | ORAL_TABLET | Freq: Three times a day (TID) | ORAL | Status: AC | PRN

## 2015-10-10 NOTE — Discharge Summary (Addendum)
Physician Discharge Summary  [name] MRN: [id number] DOB/AGE: 71-Oct-1947 71 y.o.  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   Indirect hyperbilirubinemia   ARF (acute renal failure) (HCC)   Dehydration    Follow-up recommendations  Patient anticipated to discharge with [facility]  Anticipated prognosis less than 2 weeks No further Accu-Cheks, no insulin      Medication List    STOP taking these medications        ACCU-CHEK AVIVA PLUS test strip  Generic drug:  glucose blood     bismuth subsalicylate 322 GU/54YH suspension  Commonly known as:  PEPTO BISMOL     bumetanide 2 MG tablet  Commonly known as:  BUMEX     COD LIVER OIL PO     CRANBERRY PO     dapsone 100 MG tablet     dexamethasone 4 MG tablet  Commonly known as:  DECADRON     fluconazole 100 MG tablet  Commonly known as:  DIFLUCAN     glipiZIDE 10 MG tablet  Commonly known as:  GLUCOTROL     hydrochlorothiazide 25 MG tablet  Commonly known as:  HYDRODIURIL     insulin NPH-regular Human (70-30) 100 UNIT/ML injection  Commonly known as:  NOVOLIN 70/30     insulin regular 100 units/mL injection  Commonly known as:  NOVOLIN R,HUMULIN R     iron polysaccharides 150 MG capsule  Commonly known as:  NIFEREX     potassium chloride 10 MEQ tablet  Commonly known as:  K-DUR,KLOR-CON     temozolomide 180 MG capsule  Commonly known as:  TEMODAR     traMADol 50 MG tablet  Commonly known as:  ULTRAM      TAKE these medications        acetaminophen 80 MG/0.8ML suspension  Commonly known as:  TYLENOL  Take 10 mg/kg by mouth every 4 (four) hours as needed for fever.     b complex vitamins tablet  Take 1 tablet by mouth  daily.     hydrOXYzine 50 MG tablet  Commonly known as:  ATARAX/VISTARIL  Take 1 tablet (50 mg total) by mouth 3 (three) times daily as needed for itching.     lactobacillus acidophilus Tabs tablet  Take 1 tablet by mouth daily.     latanoprost 0.005 % ophthalmic solution  Commonly known as:  XALATAN  Place 1 drop into both eyes at bedtime.     LORazepam 0.5 MG tablet  Commonly known as:  ATIVAN  Take 1 tablet (0.5 mg total) by mouth every 4 (four) hours as needed for anxiety or sleep.     morphine 20 MG/ML concentrated solution  Commonly known as:  ROXANOL  Take 0.25 mLs (5 mg total) by mouth every 4 (four) hours as needed for severe pain.     multivitamin with minerals tablet  Take 1 tablet by mouth daily.     nystatin cream  Commonly known as:  MYCOSTATIN  Apply 1 application topically daily as needed for dry skin.     OVER THE COUNTER MEDICATION  Take 1 tablet by mouth 2 (two) times daily. Chlorellea     timolol 0.5 % ophthalmic solution  Commonly known as:  TIMOPTIC  Place 1 drop into both eyes 2 (two) times daily.     vitamin C 500 MG tablet  Commonly known as:  ASCORBIC ACID  Take 500 mg by mouth 2 (two) times daily.          Discharge Condition: *Prognosis poor   Discharge Instructions         Disposition: ED Dismiss - Diverted Elsewhere   Consults:  Oncology Palliative care    Significant Diagnostic Studies:  Ct Abdomen Pelvis Wo Contrast  10/08/2015  CLINICAL DATA:  Abdominal swelling and discomfort. nausea and vomiting. Chronic kidney disease stage 3. Uterine carcinoma. EXAM: CT ABDOMEN AND PELVIS WITHOUT CONTRAST TECHNIQUE: Multidetector CT imaging of the abdomen and pelvis was performed following the standard protocol without IV contrast. COMPARISON:  06/24/2015 FINDINGS: Lower chest:  No acute findings. Hepatobiliary: No mass visualized on this un-enhanced exam. Probable hepatic cirrhosis. Numerous tiny gallstones again seen, without  evidence of cholecystitis or biliary ductal dilatation. Pancreas: No mass or inflammatory process identified on this un-enhanced exam. Spleen: Within normal limits in size. Adrenals/Urinary Tract: No evidence of urolithiasis or hydronephrosis. No definite mass visualized on this un-enhanced exam. Urinary bladder is mildly distended but otherwise unremarkable in appearance on this unenhanced exam. Stomach/Bowel: No evidence of obstruction, inflammatory process, or abnormal fluid collections. Normal appendix visualized. Mild left colonic diverticulosis again noted, without evidence of diverticulitis. Vascular/Lymphatic: No pathologically enlarged lymph nodes. No evidence of abdominal aortic aneurysm. Reproductive: No mass or other significant abnormality. IUD again visualized within uterus. Other: None. Musculoskeletal:  No suspicious bone lesions identified. IMPRESSION: Cholelithiasis.  No radiographic evidence of cholecystitis. Colonic diverticulosis. No radiographic evidence of diverticulitis. Probable hepatic cirrhosis.  No evidence of splenomegaly or ascites. IUD in expected position. Electronically Signed   By: Earle Gell M.D.   On: 10/08/2015 17:34   Mr [name] WU Contrast  10/05/2015  CLINICAL DATA:  70 year old female with glioblastoma. Completion of chemoradiation. With a taper of steroids patient did develop neurological symptoms. Diabetes. Atrial fibrillation. Subsequent encounter. EXAM: MRI HEAD WITHOUT AND WITH CONTRAST TECHNIQUE: Multiplanar, multiecho pulse sequences of the brain and surrounding structures were obtained without and with intravenous contrast. CONTRAST:  23m MULTIHANCE GADOBENATE DIMEGLUMINE 529 MG/ML IV SOLN COMPARISON:  06/24/2015 brain MR. FINDINGS: There has been a change in the appearance of the multi focal glioblastoma. Component of tumor which extends across the splenium of the corpus callosum has decreased in overall size and enhancement. For instance, central aspect the  splenium of corpus callosum now with AP dimension of 1.1 cm versus prior 1.9 cm. Tumor tumor in the posterior superior left temporal lobe has decreased in size now measuring 0.8 x 0.9 x 0.7 cm versus prior 1 x 1 x 0.9 cm. Tumor within the posterior left frontal lobe now with less enhancement and mall central necrotic component currently spanning over 3.9 x 2.5 x 1.9 cm previously measuring 4 x 3 x 1.8 cm. Satellite lesions within the parietal lobes have decreased in size and enhancement most notable involving the left parietal lobe where enhancing lesion now measures 2 mm versus prior 8 mm. New from the prior examination is a nonenhancing lobulated cystic appearing structure insinuating between the tumor within the splenium of the corpus callosum and the cavum velum interpositum. This spans over 2.5 x 1.6 x 1.8 cm. It is possible this represents post radiation therapy benign cyst rather than progressive tumor however, this will require MR surveillance. Blood breakdown products are seen within portions  of the necrotic multi focal glioblastoma. Diffuse white matter changes some of which may represent treatment of tumor however, as is typical with glioblastoma, tumor cells may be contain within the white matter findings. Additionally, patient has a history of diabetes and the patchy nature of other white matter changes suggest there is a component of underlying small vessel disease. Major intracranial vascular structures are patent. No acute thrombotic infarct. Global atrophy without hydrocephalus. Mild exophthalmos.  Post lens replacement. Pressure monitoring device may be in place right parietal region. Post radiation therapy changes of the osseous structures. Radiation therapy changes of bone marrow throughout the calvarium skullbase and upper cervical spine. Mild degenerative changes C3-4. Minimal anterior slip C4. Mild bulge C5-6. IMPRESSION: Change in appearance of multifocal glioma which has decreased in degree  enhancement and slight decrease in overall size as detailed above. Central components now appear more necrotic. New from the prior examination is a nonenhancing lobulated cystic appearing structure insinuating between the tumor within the splenium of the corpus callosum and the cavum velum interpositum. This spans over 2.5 x 1.6 x 1.8 cm. It is possible this represents post radiation therapy benign cyst rather than progressive tumor however, this will require MR surveillance. Electronically Signed   By: Genia Del M.D.   On: 10/05/2015 18:39   Dg Chest Port 1 View  10/09/2015  CLINICAL DATA:  70 year old female with weakness and glioblastoma. Medical clearance. EXAM: PORTABLE CHEST 1 VIEW COMPARISON:  02/13/2014 and prior exams FINDINGS: Cardiomegaly identified. There is no evidence of focal airspace disease, pulmonary edema, suspicious pulmonary nodule/mass, pleural effusion, or pneumothorax. No acute bony abnormalities are identified. IMPRESSION: Cardiomegaly without evidence of active cardiopulmonary disease. Electronically Signed   By: Margarette Canada M.D.   On: 10/09/2015 13:05   US Abdomen Limited Ruq  10/07/2015  CLINICAL DATA:  RIGHT upper quadrant pain history of gallstones EXAM: US ABDOMEN LIMITED - RIGHT UPPER QUADRANT COMPARISON:  CT 06/24/2015, ultrasound 812 16 FINDINGS: Gallbladder: Again demonstrated multiple dependent gallstones within lumen gallbladder. No pericholecystic fluid. Wall is mildly thickened at 3.5 mm. Negative sonographic Murphy's sign. Common bile duct: Diameter: Normal common bile duct at 2 mm Liver: No focal lesion identified. Within normal limits in parenchymal echogenicity. IMPRESSION: 1. Multiple gallstones again demonstrated within lumen gallbladder. The gallbladder wall is minimally thickened. No pericholecystic fluid or gallbladder distension. Negative sonographic Murphy's sign. No evidence acute cholecystitis. 2. Normal common bile duct. Electronically Signed   By:  Suzy Bouchard M.D.   On: 10/07/2015 19:33        Filed Weights   10/08/15 0116 10/09/15 0544 10/10/15 0528  Weight: 111.902 kg (246 lb 11.2 oz) 113.218 kg (249 lb 9.6 oz) 114.579 kg (252 lb 9.6 oz)     Microbiology: Recent Results (from the past 240 hour(s))  MRSA PCR Screening     Status: Abnormal   Collection Time: 10/08/15  3:46 AM  Result Value Ref Range Status   MRSA by PCR POSITIVE (A) NEGATIVE Final    Comment:        The GeneXpert MRSA Assay (FDA approved for NASAL specimens only), is one component of a comprehensive MRSA colonization surveillance program. It is not intended to diagnose MRSA infection nor to guide or monitor treatment for MRSA infections. RESULT CALLED TO, READ BACK BY AND VERIFIED WITHKathrin Greathouse 10/08/15 @ 0541 M KELLY        Blood Culture    Component Value Date/Time   SDES URINE, CATHETERIZED 02/13/2014  [place] 02/13/2014 0450   CULT NO GROWTH Performed at [facility] 02/13/2014 0450   REPTSTATUS 02/14/2014 FINAL 02/13/2014 0450      Labs: Results for orders placed or performed during the hospital encounter of 10/07/15 (from the past 48 hour(s))  Glucose, capillary     Status: Abnormal   Collection Time: 10/08/15 11:17 AM  Result Value Ref Range   Glucose-Capillary 228 (H) 65 - 99 mg/dL   Comment 1 Notify RN   Basic metabolic panel     Status: Abnormal   Collection Time: 10/08/15  3:07 PM  Result Value Ref Range   Sodium 137 135 - 145 mmol/L   Potassium 4.0 3.5 - 5.1 mmol/L    Comment: SLIGHT HEMOLYSIS   Chloride 99 (L) 101 - 111 mmol/L   CO2 27 22 - 32 mmol/L   Glucose, Bld 137 (H) 65 - 99 mg/dL   BUN 35 (H) 6 - 20 mg/dL   Creatinine, Ser 1.20 (H) 0.44 - 1.00 mg/dL   Calcium 8.6 (L) 8.9 - 10.3 mg/dL   GFR calc non Af Amer 45 (L) >60 mL/min   GFR calc Af Amer 52 (L) >60 mL/min    Comment: (NOTE) The eGFR has been calculated using the CKD EPI equation. This calculation has not been  validated in all clinical situations. eGFR's persistently <60 mL/min signify possible Chronic Kidney Disease.    Anion gap 11 5 - 15  CBC     Status: Abnormal   Collection Time: 10/08/15  3:07 PM  Result Value Ref Range   WBC 10.7 (H) 4.0 - 10.5 K/uL   RBC 3.15 (L) 3.87 - 5.11 MIL/uL   Hemoglobin 10.8 (L) 12.0 - 15.0 g/dL   HCT 29.5 (L) 36.0 - 46.0 %   MCV 93.7 78.0 - 100.0 fL   MCH 34.3 (H) 26.0 - 34.0 pg   MCHC 36.6 (H) 30.0 - 36.0 g/dL   RDW 17.9 (H) 11.5 - 15.5 %   Platelets 90 (L) 150 - 400 K/uL  Hepatic function panel     Status: Abnormal   Collection Time: 10/08/15  3:07 PM  Result Value Ref Range   Total Protein 5.1 (L) 6.5 - 8.1 g/dL   Albumin 2.7 (L) 3.5 - 5.0 g/dL   AST 40 15 - 41 U/L   ALT 38 14 - 54 U/L   Alkaline Phosphatase 56 38 - 126 U/L   Total Bilirubin 5.2 (H) 0.3 - 1.2 mg/dL   Bilirubin, Direct 0.9 (H) 0.1 - 0.5 mg/dL   Indirect Bilirubin 4.3 (H) 0.3 - 0.9 mg/dL  Magnesium     Status: None   Collection Time: 10/08/15  3:07 PM  Result Value Ref Range   Magnesium 2.4 1.7 - 2.4 mg/dL  DIC (disseminated intravasc coag) panel     Status: Abnormal   Collection Time: 10/08/15  3:07 PM  Result Value Ref Range   Prothrombin Time 14.8 11.6 - 15.2 seconds   INR 1.14 0.00 - 1.49   aPTT 25 24 - 37 seconds   Fibrinogen 245 204 - 475 mg/dL   D-Dimer, Quant 4.07 (H) 0.00 - 0.50 ug/mL-FEU    Comment: (NOTE) At the manufacturer cut-off of 0.50 ug/mL FEU, this assay has been documented to exclude PE with a sensitivity and negative predictive value of 97 to 99%.  At this time, this assay has not been approved by the FDA to exclude DVT/VTE. Results should be correlated with clinical presentation.  Platelets 92 (L) 150 - 400 K/uL    Comment: CONSISTENT WITH PREVIOUS RESULT   Smear Review NO SCHISTOCYTES SEEN   Ammonia     Status: Abnormal   Collection Time: 10/08/15  3:07 PM  Result Value Ref Range   Ammonia 39 (H) 9 - 35 umol/L  Glucose, capillary     Status:  Abnormal   Collection Time: 10/08/15  5:50 PM  Result Value Ref Range   Glucose-Capillary 201 (H) 65 - 99 mg/dL  Glucose, capillary     Status: Abnormal   Collection Time: 10/08/15  8:59 PM  Result Value Ref Range   Glucose-Capillary 180 (H) 65 - 99 mg/dL  Urinalysis, Routine w reflex microscopic (not at [facility])     Status: Abnormal   Collection Time: 10/09/15  6:43 AM  Result Value Ref Range   Color, Urine AMBER (A) YELLOW    Comment: BIOCHEMICALS MAY BE AFFECTED BY COLOR   APPearance CLOUDY (A) CLEAR   Specific Gravity, Urine 1.018 1.005 - 1.030   pH 5.5 5.0 - 8.0   Glucose, UA NEGATIVE NEGATIVE mg/dL   Hgb urine dipstick NEGATIVE NEGATIVE   Bilirubin Urine NEGATIVE NEGATIVE   Ketones, ur NEGATIVE NEGATIVE mg/dL   Protein, ur NEGATIVE NEGATIVE mg/dL   Nitrite NEGATIVE NEGATIVE   Leukocytes, UA NEGATIVE NEGATIVE    Comment: MICROSCOPIC NOT DONE ON URINES WITH NEGATIVE PROTEIN, BLOOD, LEUKOCYTES, NITRITE, OR GLUCOSE <1000 mg/dL.  Glucose, capillary     Status: Abnormal   Collection Time: 10/09/15  7:00 AM  Result Value Ref Range   Glucose-Capillary 153 (H) 65 - 99 mg/dL  Comprehensive metabolic panel     Status: Abnormal   Collection Time: 10/09/15 10:53 AM  Result Value Ref Range   Sodium 138 135 - 145 mmol/L   Potassium 4.1 3.5 - 5.1 mmol/L   Chloride 105 101 - 111 mmol/L   CO2 23 22 - 32 mmol/L   Glucose, Bld 122 (H) 65 - 99 mg/dL   BUN 23 (H) 6 - 20 mg/dL   Creatinine, Ser 1.09 (H) 0.44 - 1.00 mg/dL   Calcium 8.6 (L) 8.9 - 10.3 mg/dL   Total Protein 5.2 (L) 6.5 - 8.1 g/dL   Albumin 2.9 (L) 3.5 - 5.0 g/dL   AST 43 (H) 15 - 41 U/L   ALT 43 14 - 54 U/L   Alkaline Phosphatase 54 38 - 126 U/L   Total Bilirubin 4.6 (H) 0.3 - 1.2 mg/dL   GFR calc non Af Amer 50 (L) >60 mL/min   GFR calc Af Amer 58 (L) >60 mL/min    Comment: (NOTE) The eGFR has been calculated using the CKD EPI equation. This calculation has not been validated in all clinical situations. eGFR's  persistently <60 mL/min signify possible Chronic Kidney Disease.    Anion gap 10 5 - 15  Ammonia     Status: Abnormal   Collection Time: 10/09/15 10:53 AM  Result Value Ref Range   Ammonia 41 (H) 9 - 35 umol/L  TSH     Status: None   Collection Time: 10/09/15 10:53 AM  Result Value Ref Range   TSH 3.388 0.350 - 4.500 uIU/mL  Glucose, capillary     Status: None   Collection Time: 10/09/15 11:22 AM  Result Value Ref Range   Glucose-Capillary 98 65 - 99 mg/dL  Glucose, capillary     Status: Abnormal   Collection Time: 10/09/15  4:15 PM  Result Value Ref Range  Glucose-Capillary 225 (H) 65 - 99 mg/dL   Comment 1 Notify RN   Glucose, capillary     Status: Abnormal   Collection Time: 10/09/15  9:38 PM  Result Value Ref Range   Glucose-Capillary 200 (H) 65 - 99 mg/dL  Glucose, capillary     Status: Abnormal   Collection Time: 10/10/15  6:27 AM  Result Value Ref Range   Glucose-Capillary 154 (H) 65 - 99 mg/dL     Lipid Panel  No results found for: CHOL, TRIG, HDL, CHOLHDL, VLDL, LDLCALC, LDLDIRECT   Lab Results  Component Value Date   HGBA1C 6.3* 02/13/2014     Lab Results  Component Value Date   CREATININE 1.09* 10/09/2015     70 y.o. 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 Labs in the ER shows worsening renal function with hypokalemia and indirect hyperbilirubinemia. Sonogram of abdomen shows gallstones with no definite features of cholecystitis. As per patient's daughter patient has not been eating well for last 5 days with patient getting more weak and bedridden. She was started on maintenance Temodar on 09/14/2015. She was supposed to see oncology on 10/04/2014 but has become extremely weak. Labs revealed creatinine of 1.77 and a bilirubin of 6.1. Oncology instructed her  to come to the hospital and get admitted. She was admitted through the emergency room.  Assessment and plan Generalized Weakness-multifactorial-secondary to hypokalemia, renal failure, jaundice, probable hepatic cirrhosis with slightly elevated ammonia level, underlying malignancy ( Multifocal glioblastoma) ECOG PERFORMANCE STATUS: 4 - Bedbound Chemotherapy discontinued by oncology -they stopped temozolomide given thrombocytopenia, extensive bruising and nosebleeds MRI of the brain on 10/05/2015 showed some decrease in the overall size of the tumor but there was a new cystic structure between the tumor within the carpal skeletal some measuring 2.5 cm which could reflect post radiation changes. Unfortunately her performance status has markedly worsened and given the underlying incurable nature of her brain tumor, our focus should be on keeping her comfortable.   Hypokalemia-repleted, no further labs  Acute kidney injury-prerenal , 1.8> 1.2 resolved with IV fluids, IV fluids discontinued, no further labs  Leukocytosis -no underlying source of infection, UA negative, lungs clear on the CT scan of abdomen, will obtain chest x-ray  Thrombocytopenia- side effect of chemotherapy?temozolomide. Discontinued No active DIC suspected, however DIC in evolution cannot be ruled out  Diabetes mellitus discontinue insulin and Accu-Cheks  Discharge Exam:    Blood pressure 123/67, pulse 89, temperature 98 F (36.7 C), temperature source Oral, resp. rate 20, height '5\' 7"'  (1.702 m), weight 114.579 kg (252 lb 9.6 oz), SpO2 99 %.   Cardiovascular: Normal rate and regular rhythm.  Pulmonary/Chest: Effort normal.  Abdominal: Soft. She exhibits distension.  Genitourinary:  foley  Neurological: She is alert.  Word finding difficulty. Oriented to self. Knows her family.  Skin:  Petechiae. Scant intermittent bleeding from right nares  Psychiatric: She has a normal mood and affect.        Follow-up  Information    Schedule an appointment as soon as possible for a visit with Shirline Frees, MD.   Specialty:  Family Medicine   Contact information:   [address] [place] [address] [phone]       Follow up with [facility].   Specialty:  Emergency Medicine  Why:  As needed, If symptoms worsen   Contact information:   [address] [id number] [place] [phone]      Signed: Reyne Dumas 10/10/2015, 11:12 AM        Time spent >45 mins

## 2015-10-10 NOTE — Clinical Social Work Note (Signed)
Per MD patient ready for DC to [facility]. RN, patient, patient's family, and facility notified of DC by Manuela Schwartz with [facility]. RN given number for report. DC packet on chart. Ambulance transport requested for patient by Manuela Schwartz with [facility]. CSW signing off.    Liz Beach MSW, [place], [place], [phone]

## 2015-10-10 NOTE — Progress Notes (Signed)
Hospice called given report on patient .Patient to be transported via ambulance to [facility]. Family at bedside and will take patient belongings home.

## 2015-10-10 NOTE — Clinical Social Work Note (Signed)
Call placed to [facility] referral line, message left to check on status of referral.   Liz Beach MSW, [place], [place], [phone]

## 2015-11-10 DEATH — deceased

## 2016-11-30 IMAGING — CT CT ABD-PELV W/O CM
2 of 4 series · 17 of 46 positions shown, 19 images · non-contrast
Comparison: 06/24/2015

CLINICAL DATA: Abdominal swelling and discomfort. nausea and
vomiting. Chronic kidney disease stage 3. Uterine carcinoma.

EXAM:
CT ABDOMEN AND PELVIS WITHOUT CONTRAST
TECHNIQUE: Multidetector CT imaging of the abdomen and pelvis was performed
following the standard protocol without IV contrast.

[Series 2: a/p w/o 5mm · axial · non-contrast · 0.84mm/px · z∈[-1124,-714]mm · 14 of 90 slices shown, 16 images]
[im 4/90  soft-tissue]
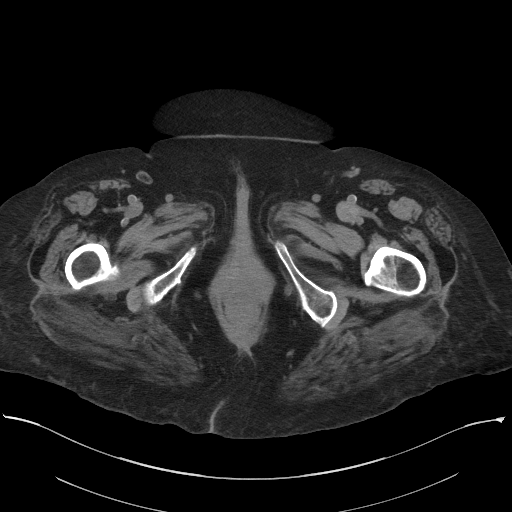
[im 4/90  bone]
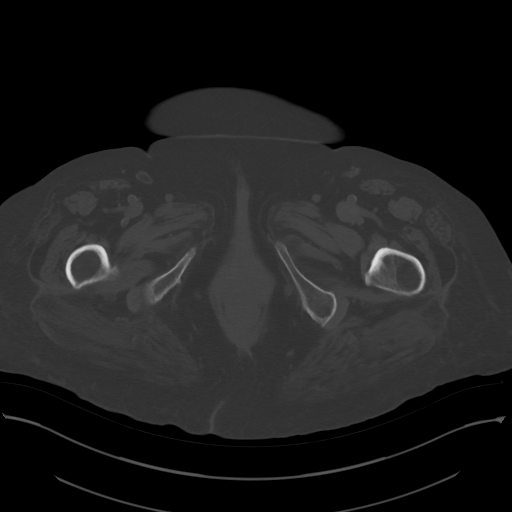
[im 11/90  soft-tissue]
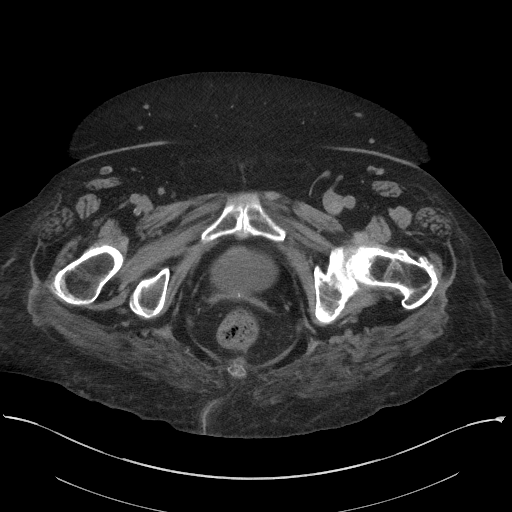
[im 18/90  soft-tissue]
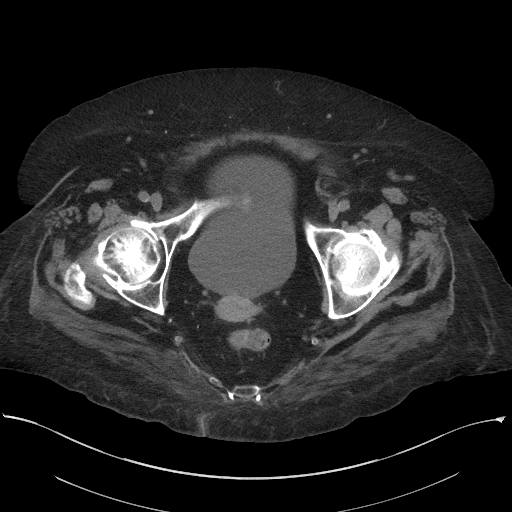
[im 25/90  soft-tissue]
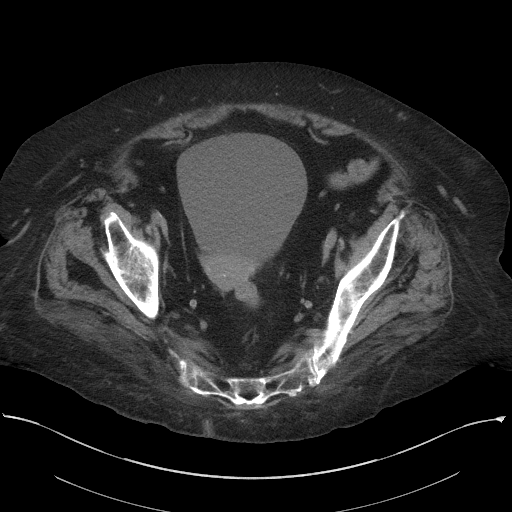
[im 29/90  soft-tissue]
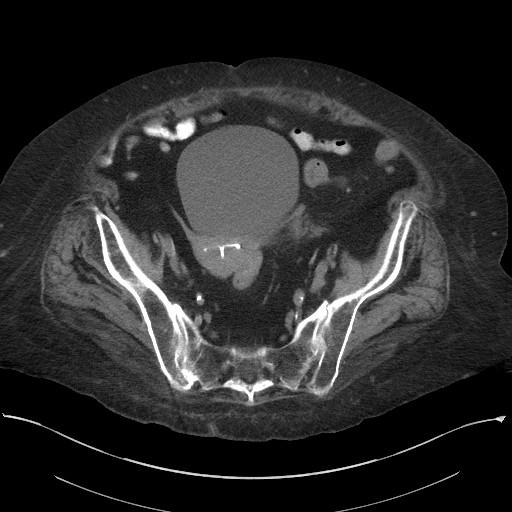
[im 36/90  soft-tissue]
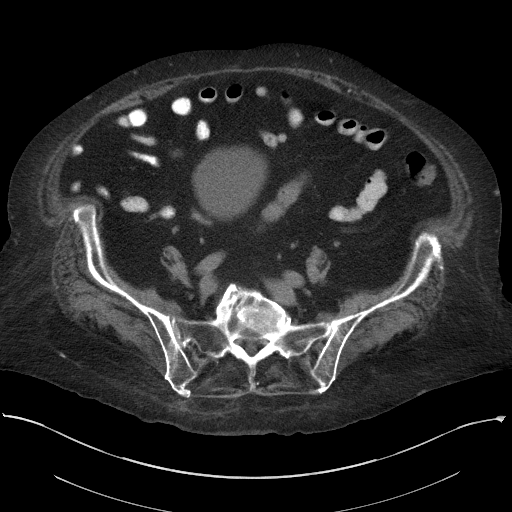
[im 43/90  soft-tissue]
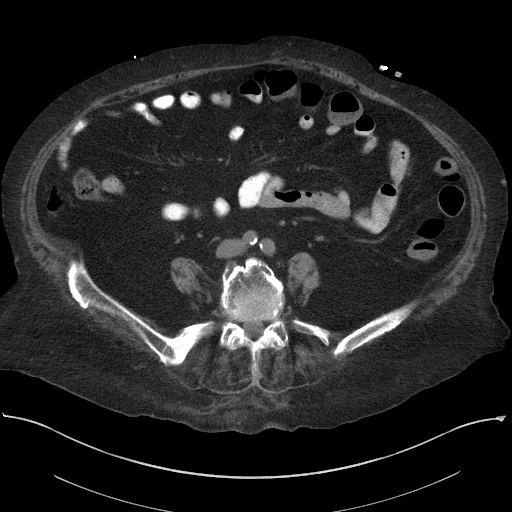
[im 47/90  soft-tissue]
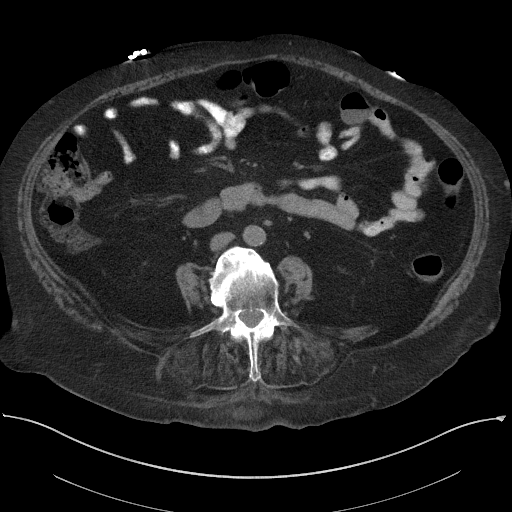
[im 54/90  soft-tissue]
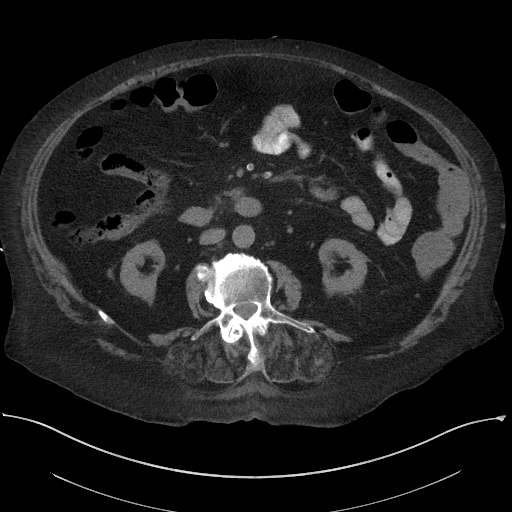
[im 54/90  bone]
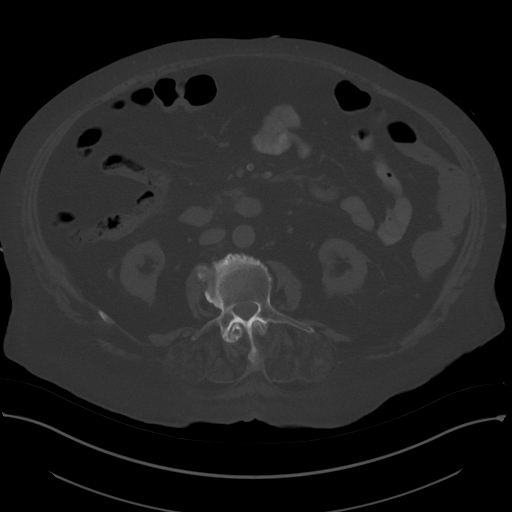
[im 61/90  soft-tissue]
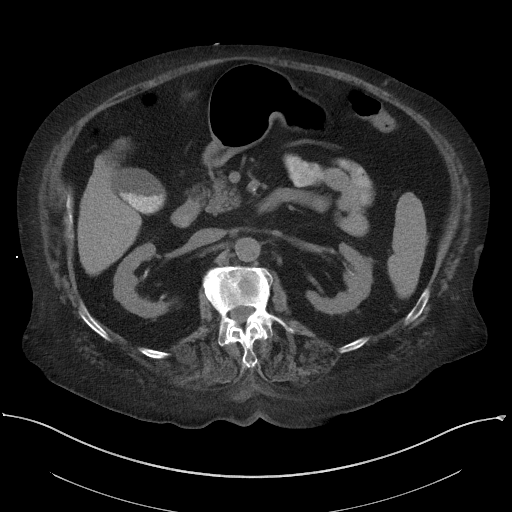
[im 68/90  soft-tissue]
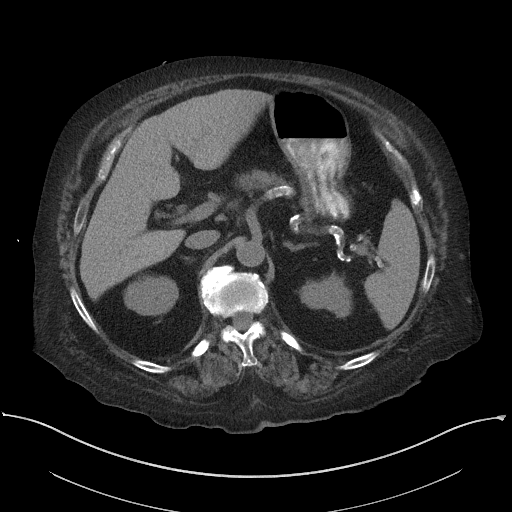
[im 72/90  soft-tissue]
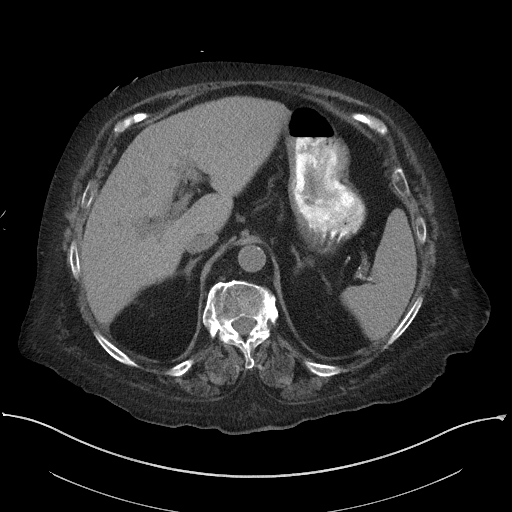
[im 79/90  soft-tissue]
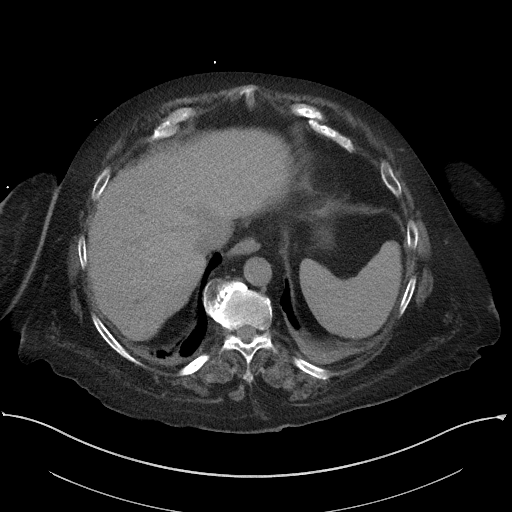
[im 86/90  soft-tissue]
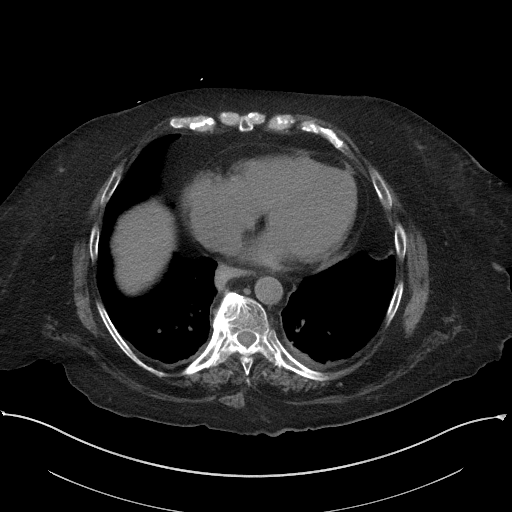

[Series 5: a/p w/o cor · coronal · non-contrast · 0.85mm/px · 3 of 151 slices shown]
[im 51/151  soft-tissue]
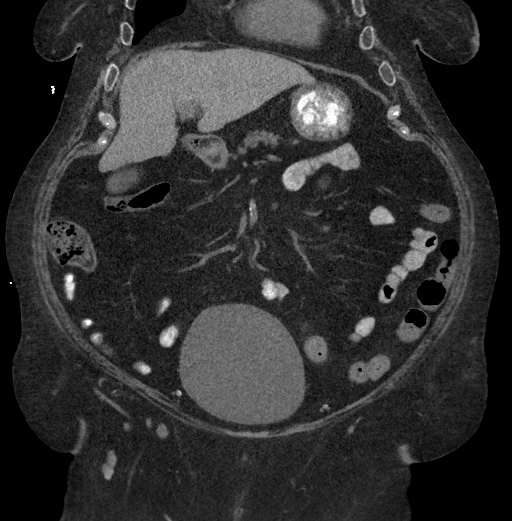
[im 67/151  soft-tissue]
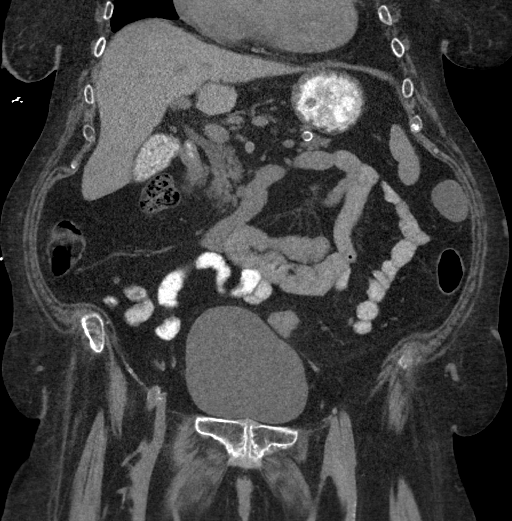
[im 84/151  soft-tissue]
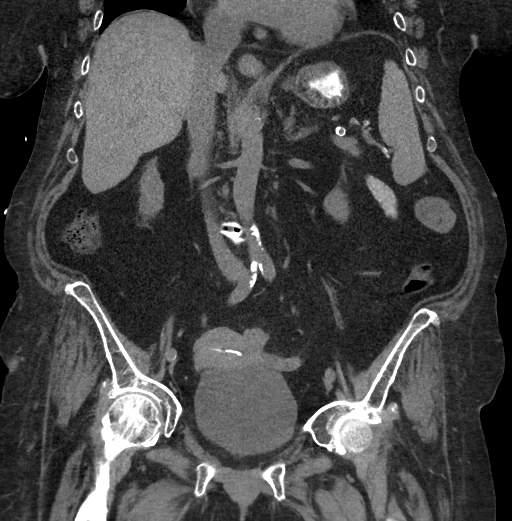

[17 of 46 positions shown; findings below may reference images not displayed]

FINDINGS: Lower chest:  No acute findings.

Hepatobiliary: No mass visualized on this un-enhanced exam. Probable
hepatic cirrhosis. Numerous tiny gallstones again seen, without
evidence of cholecystitis or biliary ductal dilatation.

Pancreas: No mass or inflammatory process identified on this
un-enhanced exam.

Spleen: Within normal limits in size.

Adrenals/Urinary Tract: No evidence of urolithiasis or
hydronephrosis. No definite mass visualized on this un-enhanced
exam. Urinary bladder is mildly distended but otherwise unremarkable
in appearance on this unenhanced exam.

Stomach/Bowel: No evidence of obstruction, inflammatory process, or
abnormal fluid collections. Normal appendix visualized. Mild left
colonic diverticulosis again noted, without evidence of
diverticulitis.

Vascular/Lymphatic: No pathologically enlarged lymph nodes. No
evidence of abdominal aortic aneurysm.

Reproductive: No mass or other significant abnormality. IUD again
visualized within uterus.

Other: None.

Musculoskeletal:  No suspicious bone lesions identified.
IMPRESSION: Cholelithiasis.  No radiographic evidence of cholecystitis.

Colonic diverticulosis. No radiographic evidence of diverticulitis.

Probable hepatic cirrhosis.  No evidence of splenomegaly or ascites.

IUD in expected position.

## 2016-12-01 IMAGING — CR DG CHEST 1V PORT
1 series · 1 of 1 positions shown · non-contrast
Comparison: 02/13/2014 and prior exams

CLINICAL DATA: 69-year-old female with weakness and glioblastoma.
Medical clearance.

EXAM:
PORTABLE CHEST 1 VIEW

[AP]
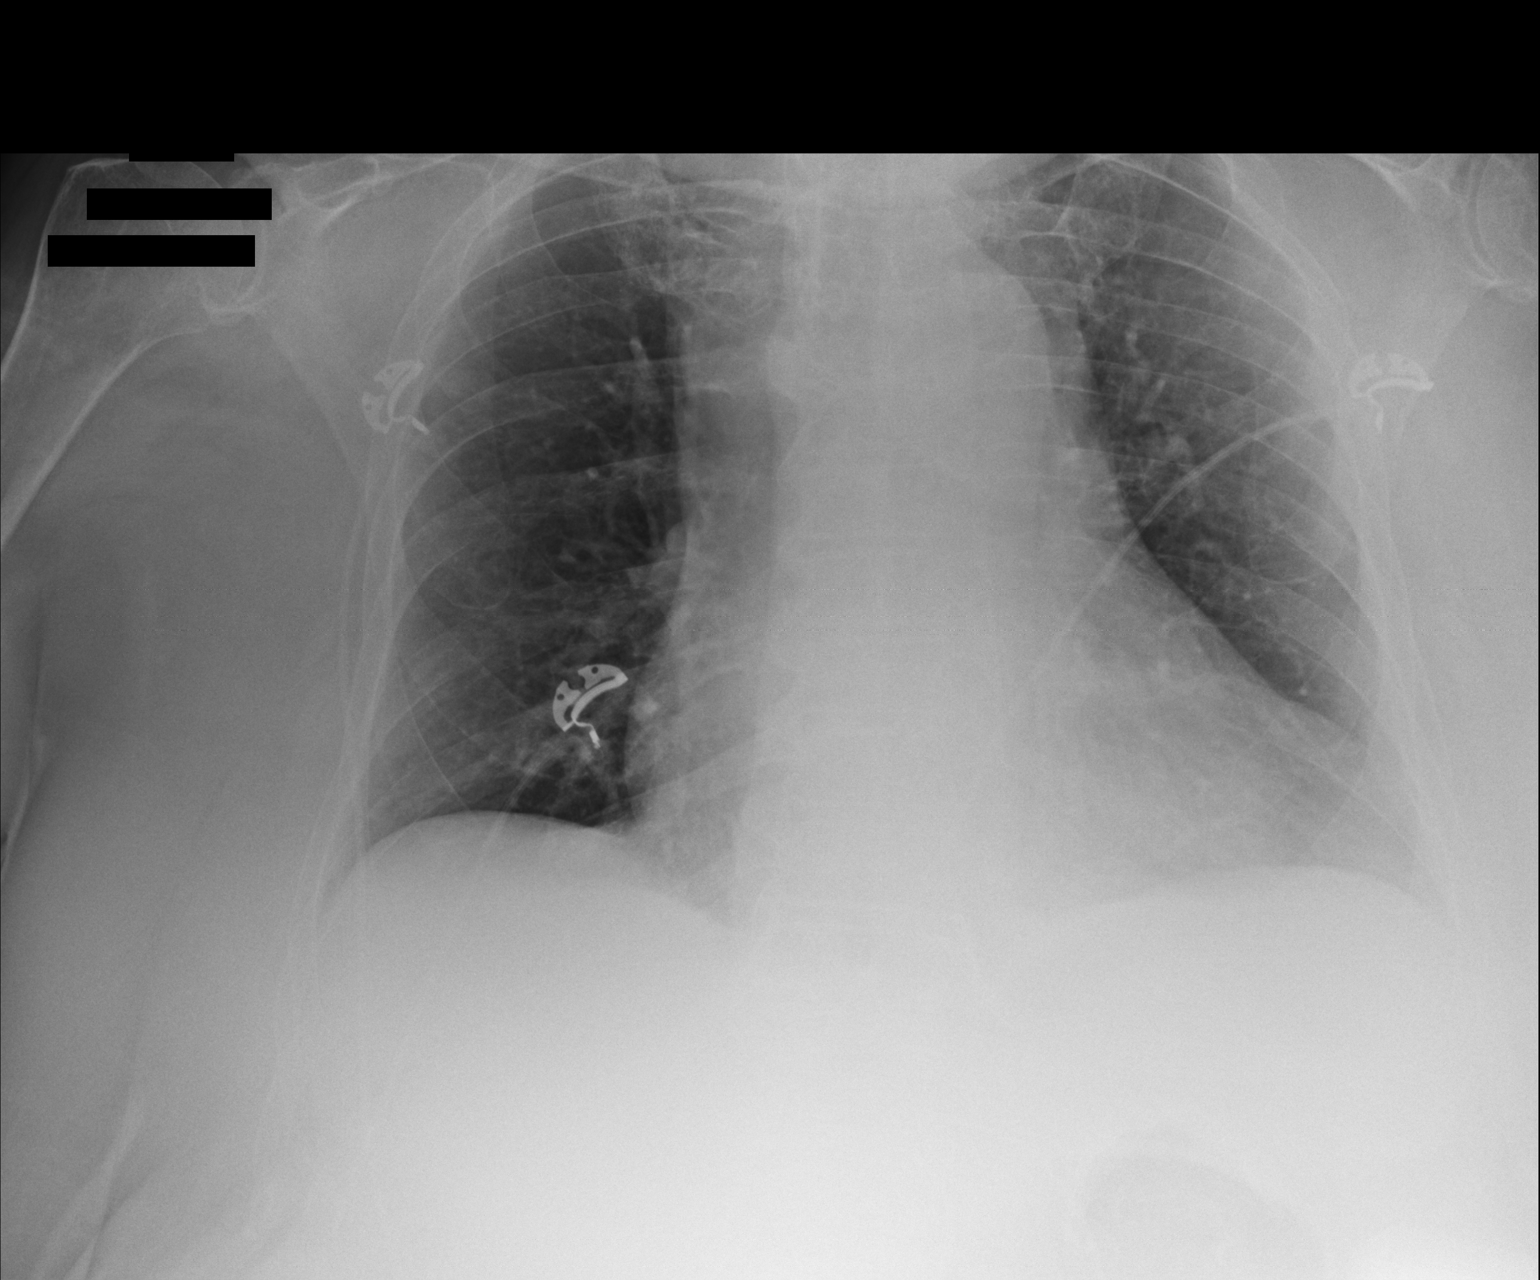

[1 of 1 positions shown; findings below may reference images not displayed]

FINDINGS: Cardiomegaly identified.

There is no evidence of focal airspace disease, pulmonary edema,
suspicious pulmonary nodule/mass, pleural effusion, or pneumothorax.

No acute bony abnormalities are identified.
IMPRESSION: Cardiomegaly without evidence of active cardiopulmonary disease.
